# Patient Record
Sex: Female | Born: 1955
Health system: Southern US, Community
[De-identification: ages and names within clinical notes are randomized; demographics above are authoritative.]

## PROBLEM LIST (undated history)

## (undated) DIAGNOSIS — K219 Gastro-esophageal reflux disease without esophagitis: Secondary | ICD-10-CM

## (undated) DIAGNOSIS — I739 Peripheral vascular disease, unspecified: Secondary | ICD-10-CM

## (undated) DIAGNOSIS — I10 Essential (primary) hypertension: Secondary | ICD-10-CM

## (undated) HISTORY — PX: CHOLECYSTECTOMY: SHX55

## (undated) HISTORY — DX: Peripheral vascular disease, unspecified: I73.9

## (undated) NOTE — *Deleted (*Deleted)
Cardiology Consult    Patient ID: MERIEM LEMIEUX; [id number]; 1956/05/19   Admit date: 04/19/2020 Date of Consult: 04/19/2020  Primary Care Provider: Patient, No Pcp Per Primary Cardiologist: New to [facility] - Dr. Wyline Mood  Patient Profile    Sarah Bonilla is a 32 y.o. female with past medical history of HTN, tobacco use and no prior cardiac history who is being seen today for the evaluation of CHF and elevated troponin at the request of Dr. Thomes Dinning.   History of Present Illness    Sarah Bonilla presented to [facility] ED during the early morning hours of 04/19/2020 for evaluation of worsening dyspnea and a nonproductive cough over the past several days. She reports being in her usual state of health until earlier this week when she started to experience worsening dyspnea on exertion along with orthopnea and PND. Last night, she had to get up from bed and walk around to see if this would help with her breathing and it did temporarily but then she developed worsening dyspnea. She denies any associated chest pain or palpitations. She has experienced abdominal distention and lower extremity edema. Says she was diagnosed with hypertension many years ago but has not been on medical therapy for at least 10+ years. No known history of CAD or CHF. Reports her father had known hypertension and her mother had a CVA. No known family history of cardiac issues. She does smoke approximately 1 pack/day.  She was hypoxic upon arrival with oxygen saturations in the 80's on room air and this improved into the 90's with 2L [place]. Ultimately desaturated again and now on BiPAP.   BP was initially elevated to 230/107. Initial labs show WBC 9.4, Hgb 8.8 (no recent values available for comparison), platelets 146, Na+ 141, K+ 2.5 and creatinine 0.67. BNP 1081. Mg 1.7. Initial HS Troponin 58 with repeat of 75. CXR showed COPD and right basilar focal pulmonary infiltrate. EKG shows sinus tachycardia, HR 110 with LVH and  nonspecific ST abnormalities along the lateral leads which is most consistent with repol abnormalities.   She was started on IV NTG for BP control and has also received K+ supplementation. Was started on PO Lisinopril 10mg  daily.   Past Medical History:  Diagnosis Date  . Hypertension     Past Surgical History:  Procedure Laterality Date  . CHOLECYSTECTOMY       Home Medications:  Prior to Admission medications   Medication Sig Start Date End Date Taking? Authorizing Provider  acetaminophen (TYLENOL) 325 MG tablet Take 650 mg by mouth every 6 (six) hours as needed for moderate pain or headache.    [provider]  ibuprofen (ADVIL,MOTRIN) 200 MG tablet Take 200 mg by mouth every 6 (six) hours as needed for headache or moderate pain.    [provider]  lisinopril (PRINIVIL,ZESTRIL) 10 MG tablet Take 1 tablet (10 mg total) by mouth daily. 12/20/17   12/22/17, MD  potassium chloride SA (K-DUR,KLOR-CON) 20 MEQ tablet Take 1 tablet (20 mEq total) by mouth 2 (two) times daily for 5 days. 12/20/17 12/25/17  12/27/17, MD    Inpatient Medications: Scheduled Meds: . azithromycin  500 mg Oral Daily   Followed by  . [START ON 04/20/2020] azithromycin  250 mg Oral Daily  . dextromethorphan-guaiFENesin  1 tablet Oral BID  . enoxaparin (LOVENOX) injection  40 mg Subcutaneous Q24H  . feeding supplement  237 mL Oral BID BM  . ipratropium-albuterol  3 mL Nebulization Q6H  .  lisinopril  10 mg Oral Daily  . methylPREDNISolone (SOLU-MEDROL) injection  20 mg Intravenous Q12H  . pantoprazole (PROTONIX) IV  40 mg Intravenous Daily   Continuous Infusions: . nitroGLYCERIN 20 mcg/min (04/19/20 0756)   PRN Meds: iohexol, ipratropium-albuterol  Allergies:   No Known Allergies  Social History:   Social History   Socioeconomic History  . Marital status: Widowed    Spouse name: Not on file  . Number of children: Not on file  . Years of education: Not on file  .  Highest education level: Not on file  Occupational History  . Not on file  Tobacco Use  . Smoking status: Current Every Day Smoker    Packs/day: 1.00    Types: Cigarettes  . Smokeless tobacco: Never Used  Vaping Use  . Vaping Use: Never used  Substance and Sexual Activity  . Alcohol use: Yes    Comment: occasionally   . Drug use: Never  . Sexual activity: Not on file  Other Topics Concern  . Not on file  Social History Narrative  . Not on file   Social Determinants of Health   Financial Resource Strain:   . Difficulty of Paying Living Expenses: Not on file  Food Insecurity:   . Worried About Programme researcher, broadcasting/film/video in the Last Year: Not on file  . Ran Out of Food in the Last Year: Not on file  Transportation Needs:   . Lack of Transportation (Medical): Not on file  . Lack of Transportation (Non-Medical): Not on file  Physical Activity:   . Days of Exercise per Week: Not on file  . Minutes of Exercise per Session: Not on file  Stress:   . Feeling of Stress : Not on file  Social Connections:   . Frequency of Communication with Friends and Family: Not on file  . Frequency of Social Gatherings with Friends and Family: Not on file  . Attends Religious Services: Not on file  . Active Member of Clubs or Organizations: Not on file  . Attends Banker Meetings: Not on file  . Marital Status: Not on file  Intimate Partner Violence:   . Fear of Current or Ex-Partner: Not on file  . Emotionally Abused: Not on file  . Physically Abused: Not on file  . Sexually Abused: Not on file     Family History:   Family History  Problem Relation Age of Onset  . CVA Mother   . Hypertension Father       Review of Systems    General:  No chills, fever, night sweats or weight changes.  Cardiovascular:  No chest pain or palpitations. Positive for dyspnea on exertion, edema and orthopnea.  Dermatological: No rash, lesions/masses Respiratory: Positive for dry cough and dyspnea.  Urologic: No hematuria, dysuria Abdominal:   No nausea, vomiting, diarrhea, bright red blood per rectum, melena, or hematemesis Neurologic:  No visual changes, wkns, changes in mental status. All other systems reviewed and are otherwise negative except as noted above.  Physical Exam/Data    Vitals:   04/19/20 0600 04/19/20 0657 04/19/20 0700 04/19/20 0730  BP: (!) 194/108 (!) 191/119 (!) 217/129 (!) 187/114  Pulse: 99 98 (!) 108 96  Resp: (!) 24 (!) 26 (!) 27 (!) 21  Temp:      TempSrc:      SpO2: 100% 100% 100% 100%  Weight:      Height:       No intake or output data in  the 24 hours ending 04/19/20 0829 Filed Weights   04/19/20 0233  Weight: 44.5 kg   Body mass index is 17.92 kg/m.   General: Pleasant thin female. Currently on BiPAP.  Psych: Normal affect. Neuro: Alert and oriented X 3. Moves all extremities spontaneously. HEENT: Normal  Neck: Supple without bruits. JVD at 10 cm. Lungs:  Resp regular and unlabored, rales along bases and rhonchi throughout. On BiPAP. Heart: Regular rhythm, tachycardiac rate. no s3, s4, or murmurs. Abdomen: Soft, non-tender, non-distended, BS + x 4.  Extremities: No clubbing or cyanosis. 1+ pitting edema bilaterally. DP/PT/Radials 2+ and equal bilaterally.   EKG:  The EKG was personally reviewed and demonstrates: Sinus tachycardia, HR 110 with LVH and nonspecific ST abnormalities along the lateral leads which is most consistent with repol abnormalities.   Telemetry:  Telemetry was personally reviewed and demonstrates: Sinus tachycardia, HR in 100's to 110's with occasional PVC's.    Labs/Studies     Relevant CV Studies:  None on File  Laboratory Data:  Chemistry Recent Labs  Lab 04/19/20 0255 04/19/20 0447  NA 141  --   K 2.5* 2.3*  CL 103  --   CO2 27  --   GLUCOSE 109*  --   BUN 19  --   CREATININE 0.67  --   CALCIUM 8.8*  --   GFRNONAA >60  --   ANIONGAP 11  --     Recent Labs  Lab 04/19/20 0255  PROT 7.2   ALBUMIN 3.3*  AST 31  ALT 30  ALKPHOS 87  BILITOT 0.8   Hematology Recent Labs  Lab 04/19/20 0255  WBC 9.4  RBC 2.95*  HGB 8.8*  HCT 27.1*  MCV 91.9  MCH 29.8  MCHC 32.5  RDW 14.3  PLT 146*   Cardiac EnzymesNo results for input(s): TROPONINI in the last 168 hours. No results for input(s): TROPIPOC in the last 168 hours.  BNP Recent Labs  Lab 04/19/20 0255  BNP 1,081.0*    DDimer No results for input(s): DDIMER in the last 168 hours.  Radiology/Studies:  DG Chest Portable 1 View  Result Date: 04/19/2020 CLINICAL DATA:  Dyspnea, nonproductive cough EXAM: PORTABLE CHEST 1 VIEW COMPARISON:  None. FINDINGS: The lungs are symmetrically hyperinflated in keeping with changes of underlying COPD. There is more focal infiltrate noted at the right lung base, possibly infectious or inflammatory in the acute setting. No pneumothorax or pleural effusion. Cardiac size within normal limits. Pulmonary vascularity is normal. IMPRESSION: COPD. Right basilar focal pulmonary infiltrate, likely infectious in the appropriate clinical setting. Electronically Signed   By: Helyn Numbers MD   On: 04/19/2020 04:06     Assessment & Plan    1. Acute CHF Exacerbation (Echocardiogram pending to determine Systolic versus Diastolic Dysfunction) - She presented with worsening dyspnea on exertion, orthopnea, lower extremity edema and abdominal distention for the past week. BNP found to be elevated to 1081 and CXR showed COPD and possible infiltrate. - An echocardiogram is pending to assess EF and wall motion. She is at risk for a hypertensive induced cardiomyopathy given her uncontrolled blood pressure as she reports not being on medical therapy for 10+ years. - IV Lasix has not yet been administered given her significant hypokalemia (K+ still low at 2.3 by repeat labs at 0447 with additional supplementation ordered). Once K+ has improved to at least greater than 3, would recommend starting IV Lasix 40 mg  twice daily and following I's and O's along with daily  weights and continuing with K+ supplementation to keep K+ ~ 4.0. Further medication recommendations pending echocardiogram results.  2. Elevated Troponin Values - Initial HS Troponin 58 with repeat of 75. EKG shows sinus tachycardia, HR 110 with LVH and nonspecific ST abnormalities along the lateral leads which is most consistent with repol abnormalities.   - She denies any recent chest pain or history of CAD.   - Suspect her enzyme elevation is secondary to demand ischemia in the setting of CHF and hypertensive urgency. An echocardiogram is pending for further evaluation. If her EF is found to be reduced, would anticipate medical therapy for possible hypertensive cardiomyopathy with repeat imaging in several months once compliant with medical therapy.   3. Hypertensive Emergency - BP was initially elevated to 230/107. She has been started on IV nitroglycerin. Lisinopril 10 mg ordered for later this morning but she is currently NPO given the use of BiPAP. If this can be weaned, would anticipate transitioning to PO medications later today (echo would help guide medication choices). Avoid BB for now given COPD exacerbation.   4. Hypokalemia -  K+ 2.3 on most recent check with additional supplementation ordered.   5. COPD Exacerbation/Tobacco Use - CXR showed COPD and right basilar focal pulmonary infiltrate. She has been started on antibiotic therapy along with scheduled nebulizers and steroids. Will need PFT's as an outpatient. Further management per admitting team.   For questions or updates, please contact [facility] Please consult [email or web address] for contact info under Cardiology/STEMI.  Signed, Ellsworth Lennox, PA-C 04/19/2020, 8:29 AM Pager: [phone]  Attending note Patient seen and disucssed with PA Iran Ouch, I agree with her documentation. 78 yo female history of HTN and medication noncompliance, tobacco abuse, has not  seen a physician in a very long time presents with SOB and leg swelling, orthopnea.    WBC 9.4 Hgb 8.8 Plt 146 K 2.5 BUN 19 BNP 1081 Mg 1.7  COVID neg Trop 58-->75-->78 ABG 7.4/38/120/25 on 100% FiO2 CXR COPD changes, right basilar focal infiltrate EKG SR, severe LVH  SOB with ongoing workup including upcoming CT PE. CXR also with possible infiltrate though normwal WBC and febrile. Follow up CT imaging, could consider procalcitonin pending CT findings. Evidence that new onset HF is playing at least some role in her presentation given LE edema, orthopnea, BNP 1000. She has severe HTN in ER, had not seen physicians as outpatient so likely long standing severe HTN at home increasing risk for hypertensive heart disease, her severe LVH on EKG would support this  . Started on NG gtt in ER. Had been waiting on K to improve before IV lasix but since still on bipap go ahead and dose IV lasix 60mg  x 1 now. Ongoing HTN on NG drip, dose IV labetalol 10mg  x 1. Once off bipap can start oral regimen and titrate. She likely has lived at this bp for quite some time, a gradual decline would be reasonable, would avoid a rapid correction.  Anemia per primary team

---

## 2017-12-20 ENCOUNTER — Encounter (HOSPITAL_COMMUNITY): Payer: Self-pay | Admitting: Emergency Medicine

## 2017-12-20 ENCOUNTER — Emergency Department (HOSPITAL_COMMUNITY)
Admission: EM | Admit: 2017-12-20 | Discharge: 2017-12-20 | Disposition: A | Discharge status: Home / Self Care | Payer: Self-pay | Attending: Emergency Medicine | Admitting: Emergency Medicine

## 2017-12-20 ENCOUNTER — Emergency Department (HOSPITAL_COMMUNITY): Payer: Self-pay

## 2017-12-20 ENCOUNTER — Other Ambulatory Visit: Payer: Self-pay

## 2017-12-20 DIAGNOSIS — F17210 Nicotine dependence, cigarettes, uncomplicated: Secondary | ICD-10-CM | POA: Insufficient documentation

## 2017-12-20 DIAGNOSIS — R42 Dizziness and giddiness: Secondary | ICD-10-CM

## 2017-12-20 DIAGNOSIS — Z79899 Other long term (current) drug therapy: Secondary | ICD-10-CM | POA: Insufficient documentation

## 2017-12-20 DIAGNOSIS — I10 Essential (primary) hypertension: Secondary | ICD-10-CM | POA: Insufficient documentation

## 2017-12-20 DIAGNOSIS — E876 Hypokalemia: Secondary | ICD-10-CM | POA: Insufficient documentation

## 2017-12-20 DIAGNOSIS — I160 Hypertensive urgency: Secondary | ICD-10-CM | POA: Insufficient documentation

## 2017-12-20 HISTORY — DX: Essential (primary) hypertension: I10

## 2017-12-20 LAB — CBC WITH DIFFERENTIAL/PLATELET
BASOS ABS: 0 10*3/uL (ref 0.0–0.1)
Basophils Relative: 0 %
EOS ABS: 0 10*3/uL (ref 0.0–0.7)
EOS PCT: 0 %
HCT: 39 % (ref 36.0–46.0)
Hemoglobin: 13.3 g/dL (ref 12.0–15.0)
LYMPHS ABS: 3.3 10*3/uL (ref 0.7–4.0)
LYMPHS PCT: 46 %
MCH: 32.2 pg (ref 26.0–34.0)
MCHC: 34.1 g/dL (ref 30.0–36.0)
MCV: 94.4 fL (ref 78.0–100.0)
MONO ABS: 0.4 10*3/uL (ref 0.1–1.0)
Monocytes Relative: 6 %
Neutro Abs: 3.4 10*3/uL (ref 1.7–7.7)
Neutrophils Relative %: 48 %
PLATELETS: 223 10*3/uL (ref 150–400)
RBC: 4.13 MIL/uL (ref 3.87–5.11)
RDW: 12.7 % (ref 11.5–15.5)
WBC: 7.1 10*3/uL (ref 4.0–10.5)

## 2017-12-20 LAB — BASIC METABOLIC PANEL
Anion gap: 12 (ref 5–15)
BUN: 15 mg/dL (ref 8–23)
CALCIUM: 9.3 mg/dL (ref 8.9–10.3)
CO2: 28 mmol/L (ref 22–32)
Chloride: 104 mmol/L (ref 98–111)
Creatinine, Ser: 0.69 mg/dL (ref 0.44–1.00)
GFR calc Af Amer: >60 mL/min (ref >60)
GLUCOSE: 114 mg/dL — AB (ref 70–99)
Potassium: 2.6 mmol/L — CL (ref 3.5–5.1)
SODIUM: 144 mmol/L (ref 135–145)

## 2017-12-20 LAB — MAGNESIUM: Magnesium: 2.2 mg/dL (ref 1.7–2.4)

## 2017-12-20 LAB — TROPONIN I: Troponin I: <0.03 ng/mL (ref <0.03)

## 2017-12-20 MED ORDER — LISINOPRIL 10 MG PO TABS: 10.0000 mg | ORAL_TABLET | Freq: Every day | ORAL | 1 refills | Status: DC

## 2017-12-20 MED ORDER — ACETAMINOPHEN 325 MG PO TABS
650.0000 mg | ORAL_TABLET | Freq: Once | ORAL | Status: AC
  Administered 2017-12-20: 650 mg via ORAL
  Filled 2017-12-20: qty 2

## 2017-12-20 MED ORDER — POTASSIUM CHLORIDE 10 MEQ/100ML IV SOLN
10.0000 meq | INTRAVENOUS | Status: AC
  Administered 2017-12-20 (×3): 10 meq via INTRAVENOUS
  Filled 2017-12-20 (×3): qty 100

## 2017-12-20 MED ORDER — POTASSIUM CHLORIDE CRYS ER 20 MEQ PO TBCR: 20.0000 meq | EXTENDED_RELEASE_TABLET | Freq: Two times a day (BID) | ORAL | 0 refills | Status: DC

## 2017-12-20 MED ORDER — POTASSIUM CHLORIDE CRYS ER 20 MEQ PO TBCR
40.0000 meq | EXTENDED_RELEASE_TABLET | Freq: Once | ORAL | Status: AC
  Administered 2017-12-20: 40 meq via ORAL
  Filled 2017-12-20: qty 2

## 2017-12-20 MED ORDER — LACTATED RINGERS IV BOLUS
1000.0000 mL | Freq: Once | INTRAVENOUS | Status: AC
  Administered 2017-12-20: 1000 mL via INTRAVENOUS

## 2017-12-20 NOTE — ED Notes (Signed)
BP 235/121.  Pt not on BP medication.

## 2017-12-20 NOTE — ED Notes (Signed)
Date and time results received: 12/20/17 9:41 AM  (use smartphrase ".now" to insert current time)  Test: K+ Critical Value: 2.6  Name of Provider Notified: Criss AlvineGoldston  Orders Received? Or Actions Taken?: Orders Received - See Orders for details

## 2017-12-20 NOTE — ED Provider Notes (Signed)
[facility] EMERGENCY DEPARTMENT Provider Note   CSN: [id number] Arrival date & time: 12/20/17  0901     History   Chief Complaint Chief Complaint  Patient presents with  . Dizziness    HPI Sarah Bonilla is a 62 y.o. female.  HPI  62 year old female presents with a chief complaint of dizziness.  Patient states that started last night sometime around 9 PM.  She got a warm feeling and noticed a slight, 2/10 headache.  She felt dizzy which she describes as off balance but not a room spinning sensation or a near syncope feeling.  She vomited once.  Overall the whole episode seemed to last around 5 minutes.  Went to bed but when she woke up she is been feeling the hot feeling again.  She states there is a feeling in her head or discomfort but it is not really a pain in her forehead.  The dizziness is not really present right now.  No weakness or numbness or blurry vision.  No chest pain or shortness of breath.  She denies any significant past medical history.  Past Medical History:  Diagnosis Date  . Hypertension     There are no active problems to display for this patient.   Past Surgical History:  Procedure Laterality Date  . CHOLECYSTECTOMY       OB History   None      Home Medications    Prior to Admission medications   Medication Sig Start Date End Date Taking? Authorizing Provider  acetaminophen (TYLENOL) 325 MG tablet Take 650 mg by mouth every 6 (six) hours as needed for moderate pain or headache.   Yes [provider]  ibuprofen (ADVIL,MOTRIN) 200 MG tablet Take 200 mg by mouth every 6 (six) hours as needed for headache or moderate pain.   Yes [provider]  lisinopril (PRINIVIL,ZESTRIL) 10 MG tablet Take 1 tablet (10 mg total) by mouth daily. 12/20/17   Pricilla LovelessGoldston, Euel Castile, MD  potassium chloride SA (K-DUR,KLOR-CON) 20 MEQ tablet Take 1 tablet (20 mEq total) by mouth 2 (two) times daily for 5 days. 12/20/17 12/25/17  Pricilla LovelessGoldston, Torien Ramroop, MD     Family History No family history on file.  Social History Social History   Tobacco Use  . Smoking status: Current Every Day Smoker    Packs/day: 1.00    Types: Cigarettes  . Smokeless tobacco: Never Used  Substance Use Topics  . Alcohol use: Yes    Comment: occasionally   . Drug use: Never     Allergies   Patient has no known allergies.   Review of Systems Review of Systems  Eyes: Negative for visual disturbance.  Respiratory: Negative for shortness of breath.   Cardiovascular: Negative for chest pain.  Gastrointestinal: Positive for vomiting. Negative for abdominal pain.  Neurological: Positive for dizziness and headaches. Negative for weakness and numbness.  All other systems reviewed and are negative.    Physical Exam Updated Vital Signs BP (!) 174/77 (BP Location: Right Arm)   Pulse 60   Temp 98.2 F (36.8 C) (Oral)   Resp 16   Ht 5\' 2"  (1.575 m)   Wt 48.5 kg (107 lb)   SpO2 99%   BMI 19.57 kg/m   Physical Exam  Constitutional: She is oriented to person, place, and time. She appears well-developed and well-nourished.  HENT:  Head: Normocephalic and atraumatic.  Right Ear: External ear normal.  Left Ear: External ear normal.  Nose: Nose normal.  Eyes: Pupils  are equal, round, and reactive to light. EOM are normal. Right eye exhibits no discharge. Left eye exhibits no discharge.  Neck: Neck supple.  Cardiovascular: Normal rate, regular rhythm and normal heart sounds.  Pulmonary/Chest: Effort normal and breath sounds normal.  Abdominal: Soft. There is no tenderness.  Neurological: She is alert and oriented to person, place, and time.  CN 3-12 grossly intact. 5/5 strength in all 4 extremities. Grossly normal sensation. Normal finger to nose.   Skin: Skin is warm and dry.  Nursing note and vitals reviewed.    ED Treatments / Results  Labs (all labs ordered are listed, but only abnormal results are displayed) Labs Reviewed  BASIC METABOLIC  PANEL - Abnormal; Notable for the following components:      Result Value   Potassium 2.6 (*)    Glucose, Bld 114 (*)    All other components within normal limits  CBC WITH DIFFERENTIAL/PLATELET  MAGNESIUM  TROPONIN I    EKG EKG Interpretation  Date/Time:  Monday December 20 2017 09:18:47 EDT Ventricular Rate:  80 PR Interval:    QRS Duration: 74 QT Interval:  367 QTC Calculation: 424 R Axis:   55 Text Interpretation:  Sinus rhythm Borderline short PR interval LVH with secondary repolarization abnormality No old tracing to compare Confirmed by Pricilla Loveless [phone]) on 12/20/2017 9:39:27 AM   Radiology Ct Head Wo Contrast  Result Date: 12/20/2017 CLINICAL DATA:  Dizziness and stroke-like symptoms EXAM: CT HEAD WITHOUT CONTRAST TECHNIQUE: Contiguous axial images were obtained from the base of the skull through the vertex without intravenous contrast. COMPARISON:  None. FINDINGS: Brain: No evidence of acute infarction, hemorrhage, hydrocephalus, extra-axial collection or mass lesion/mass effect. Vascular: No hyperdense vessel or unexpected calcification. Skull: Normal. Negative for fracture or focal lesion. Sinuses/Orbits: No acute finding. Other: None. IMPRESSION: Normal head CT Electronically Signed   By: Alcide Clever M.D.   On: 12/20/2017 11:20    Procedures Procedures (including critical care time)  Medications Ordered in ED Medications  acetaminophen (TYLENOL) tablet 650 mg (650 mg Oral Given 12/20/17 1028)  lactated ringers bolus 1,000 mL (0 mLs Intravenous Stopped 12/20/17 1236)  potassium chloride SA (K-DUR,KLOR-CON) CR tablet 40 mEq (40 mEq Oral Given 12/20/17 1028)  potassium chloride 10 mEq in 100 mL IVPB (0 mEq Intravenous Stopped 12/20/17 1350)     Initial Impression / Assessment and Plan / ED Course  I have reviewed the triage vital signs and the nursing notes.  Pertinent labs & imaging results that were available during my care of the patient were reviewed by me and  considered in my medical decision making (see chart for details).     Patient's dizziness is nonspecific.  Could be related to her hypertension but I suspect this is probably subacute or chronic.  She is noted to have significant hypokalemia and this is been repleted IV and orally.  Magnesium normal.  She did endorse an episode of vomiting and feeling off balance last night.  Given her nonspecific headache, CT obtained but is benign.  I discussed MRI for possible subacute stroke or TIA but she declines.  I think it is probably less likely she had a stroke given no nystagmus or current off-balance sensation.  I did discuss the importance of following up with a PCP and treating blood pressure.  No chest pain.  Return precautions.  Final Clinical Impressions(s) / ED Diagnoses   Final diagnoses:  Dizziness  Hypertensive urgency  Hypokalemia    ED Discharge Orders  Ordered    potassium chloride SA (K-DUR,KLOR-CON) 20 MEQ tablet  2 times daily,   Status:  Discontinued     12/20/17 1205    lisinopril (PRINIVIL,ZESTRIL) 10 MG tablet  Daily,   Status:  Discontinued     12/20/17 1205    lisinopril (PRINIVIL,ZESTRIL) 10 MG tablet  Daily     12/20/17 1205    potassium chloride SA (K-DUR,KLOR-CON) 20 MEQ tablet  2 times daily     12/20/17 1205       Pricilla Loveless, MD 12/20/17 1604

## 2017-12-20 NOTE — ED Triage Notes (Signed)
Pt c/o dizziness, "feeling hot," and nausea/vomiting that began yesterday. Pt states she feels it is related to the heat. Pt does not work outside.

## 2017-12-20 NOTE — Discharge Instructions (Signed)
If your dizziness recurs or worsens or he develop a severe headache, vomiting, weakness or numbness in your arms or legs, blurry vision, or any other new/concerning symptoms and return to the ER for evaluation.  Otherwise follow-up with your primary care physician.

## 2019-08-26 IMAGING — CT CT HEAD W/O CM
3 series · 16 of 47 positions shown, 19 images · non-contrast
Comparison: None.

CLINICAL DATA: Dizziness and stroke-like symptoms

EXAM:
CT HEAD WITHOUT CONTRAST
TECHNIQUE: Contiguous axial images were obtained from the base of the skull
through the vertex without intravenous contrast.

[Series 2: head wo · axial · 0.40mm/px · z∈[-150,-20]mm · 10 of 32 slices shown, 13 images]
[im 3/32  brain]
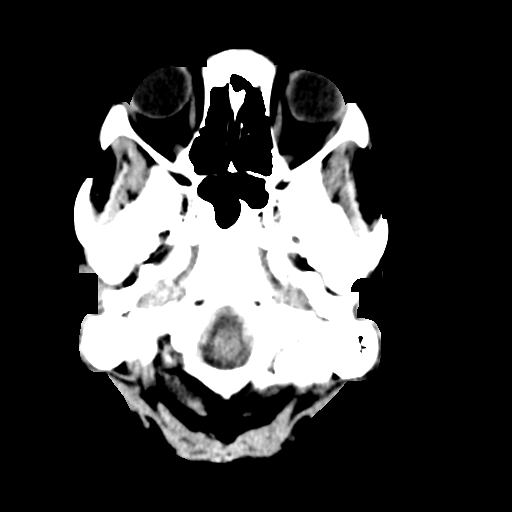
[im 3/32  bone]
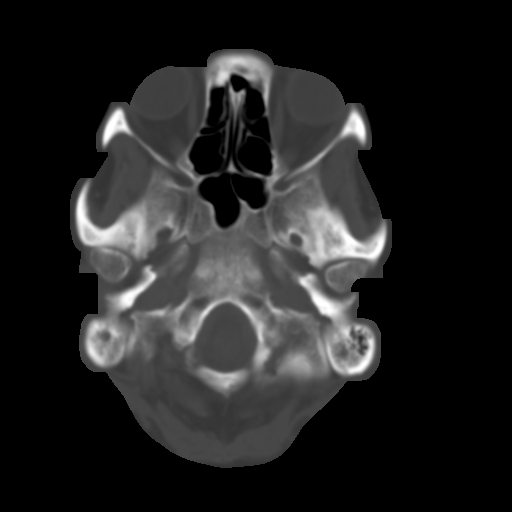
[im 6/32  brain]
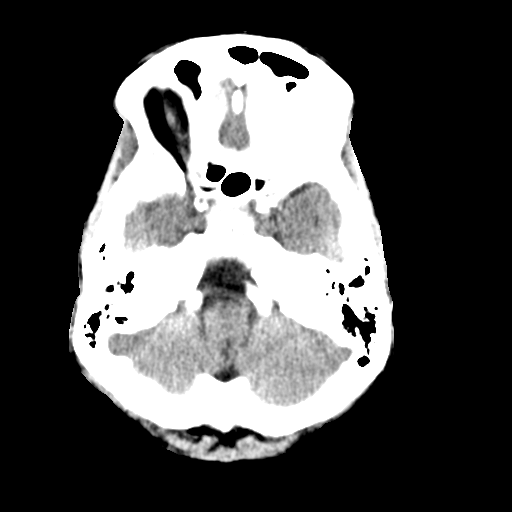
[im 9/32  brain]
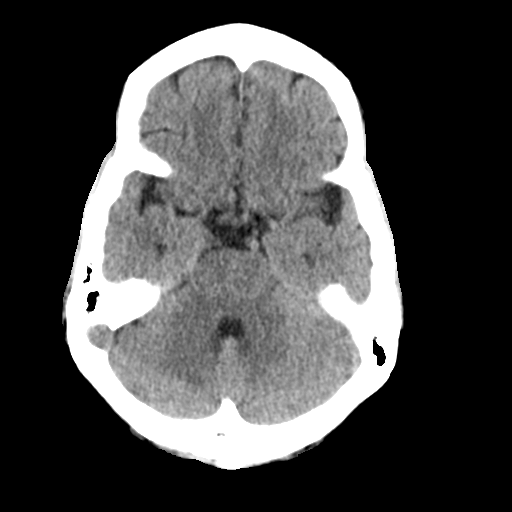
[im 11/32  brain]
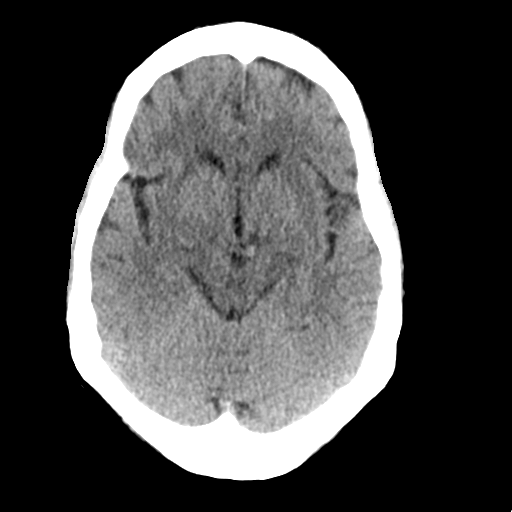
[im 14/32  brain]
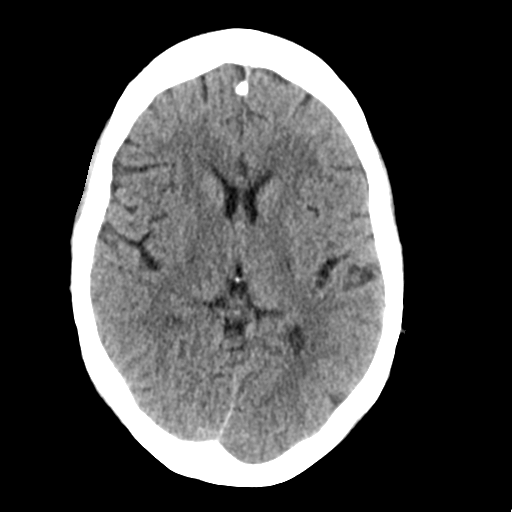
[im 14/32  bone]
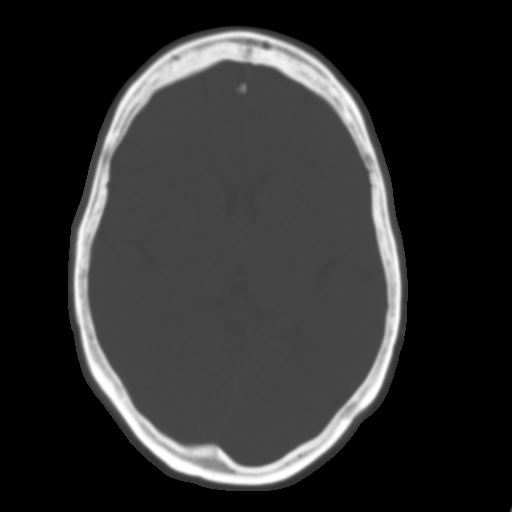
[im 18/32  brain]
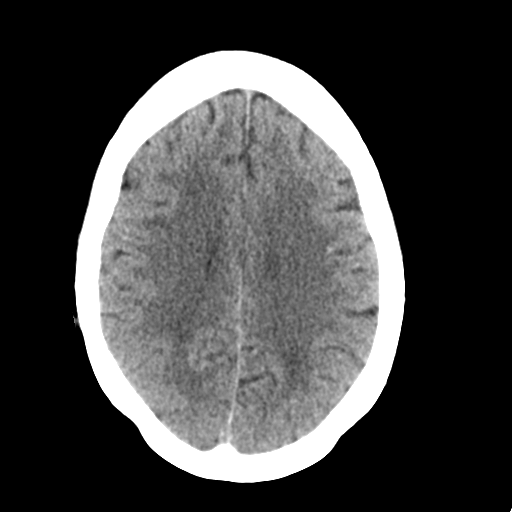
[im 21/32  brain]
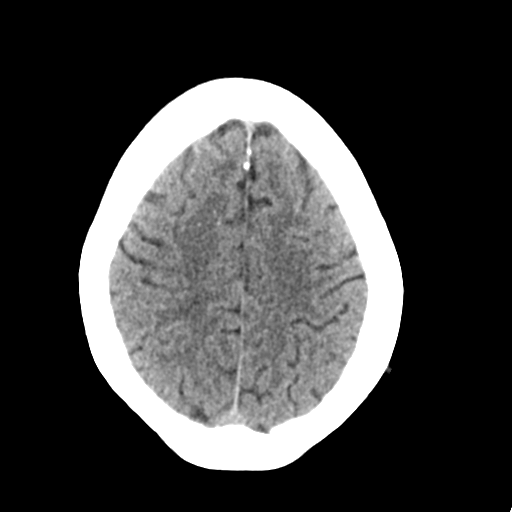
[im 24/32  brain]
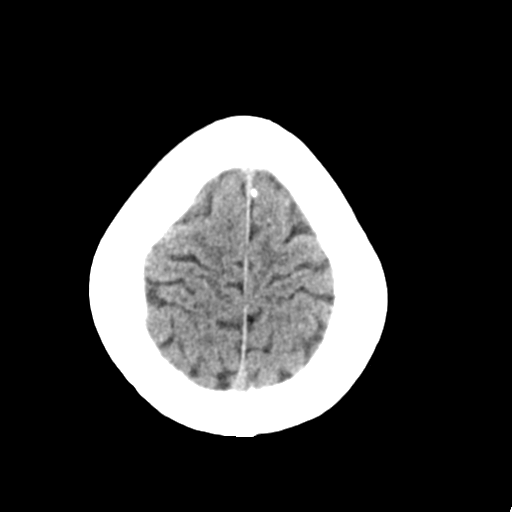
[im 26/32  brain]
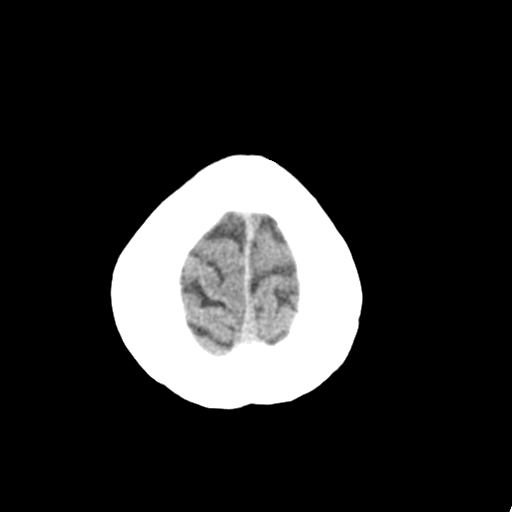
[im 26/32  bone]
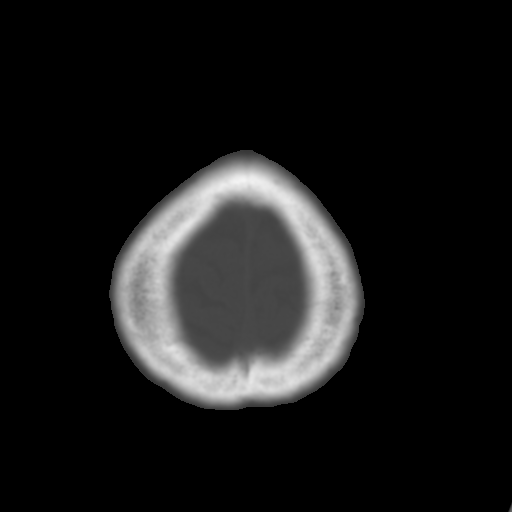
[im 29/32  brain]
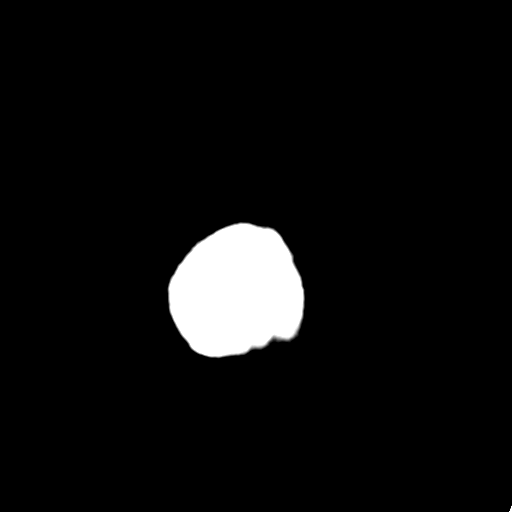

[Series 4: coronal soft tissue · coronal · 0.29mm/px · 3 of 64 slices shown]
[im 22/64  brain]
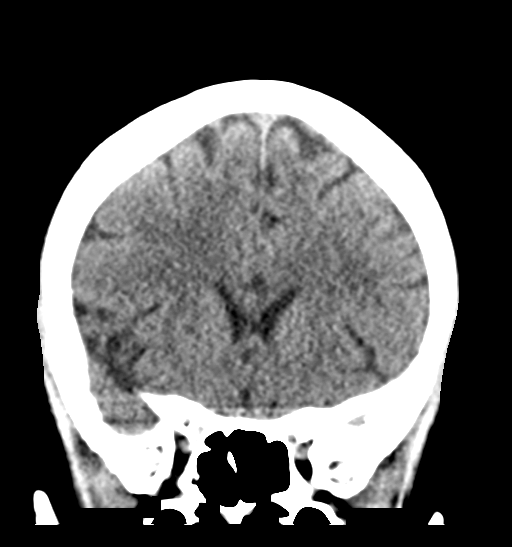
[im 29/64  brain]
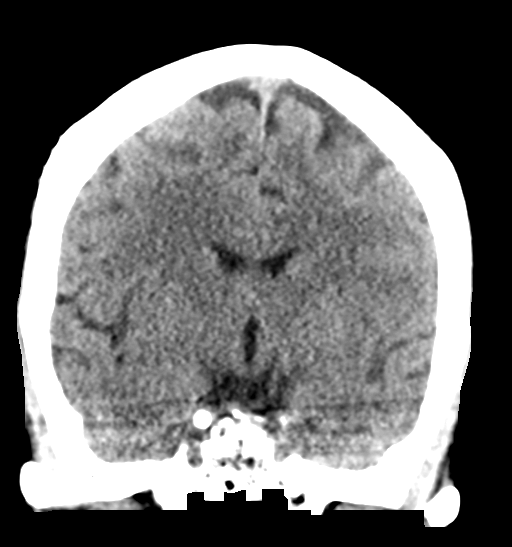
[im 36/64  brain]
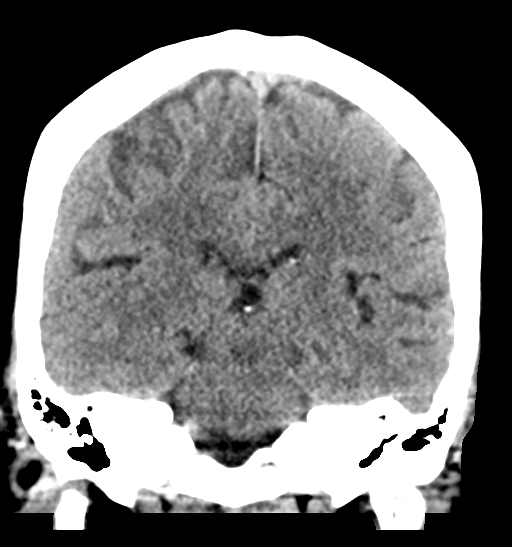

[Series 5: sagittal soft tissue · sagittal · 0.30mm/px · 3 of 50 slices shown]
[im 17/50  brain]
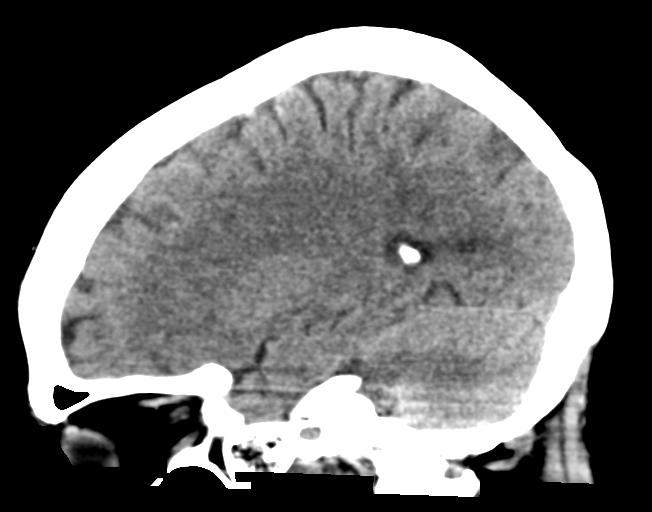
[im 25/50  brain]
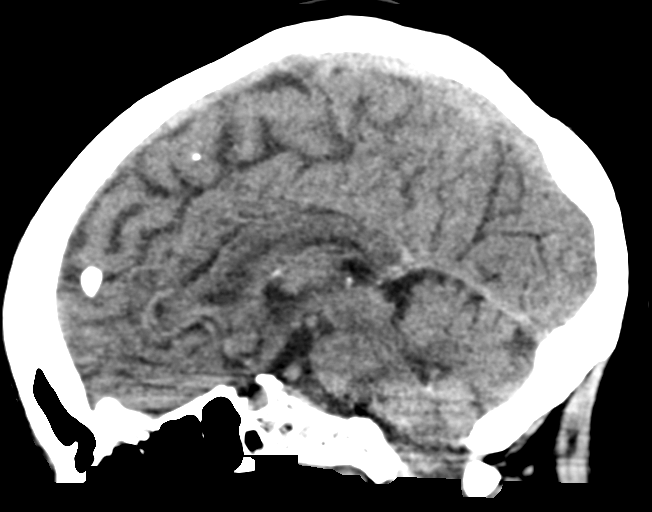
[im 33/50  brain]
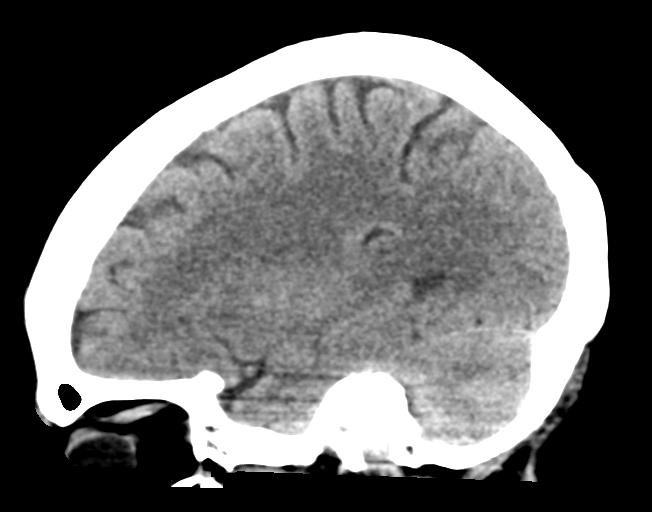

[16 of 47 positions shown; findings below may reference images not displayed]

FINDINGS: Brain: No evidence of acute infarction, hemorrhage, hydrocephalus,
extra-axial collection or mass lesion/mass effect.

Vascular: No hyperdense vessel or unexpected calcification.

Skull: Normal. Negative for fracture or focal lesion.

Sinuses/Orbits: No acute finding.

Other: None.
IMPRESSION: Normal head CT

## 2020-04-19 ENCOUNTER — Inpatient Hospital Stay (HOSPITAL_COMMUNITY): Payer: Self-pay

## 2020-04-19 ENCOUNTER — Emergency Department (HOSPITAL_COMMUNITY): Payer: Self-pay

## 2020-04-19 ENCOUNTER — Inpatient Hospital Stay (HOSPITAL_COMMUNITY)
Admission: EM | Admit: 2020-04-19 | Discharge: 2020-04-24 | DRG: 291 | Disposition: A | Discharge status: Home / Self Care | Payer: Self-pay | Attending: Internal Medicine | Admitting: Internal Medicine

## 2020-04-19 ENCOUNTER — Encounter (HOSPITAL_COMMUNITY): Payer: Self-pay

## 2020-04-19 ENCOUNTER — Other Ambulatory Visit: Payer: Self-pay

## 2020-04-19 DIAGNOSIS — Z20822 Contact with and (suspected) exposure to covid-19: Secondary | ICD-10-CM | POA: Diagnosis present

## 2020-04-19 DIAGNOSIS — D696 Thrombocytopenia, unspecified: Secondary | ICD-10-CM

## 2020-04-19 DIAGNOSIS — Z720 Tobacco use: Secondary | ICD-10-CM

## 2020-04-19 DIAGNOSIS — M7989 Other specified soft tissue disorders: Secondary | ICD-10-CM

## 2020-04-19 DIAGNOSIS — I5033 Acute on chronic diastolic (congestive) heart failure: Secondary | ICD-10-CM | POA: Diagnosis present

## 2020-04-19 DIAGNOSIS — Z79899 Other long term (current) drug therapy: Secondary | ICD-10-CM

## 2020-04-19 DIAGNOSIS — F17210 Nicotine dependence, cigarettes, uncomplicated: Secondary | ICD-10-CM | POA: Diagnosis present

## 2020-04-19 DIAGNOSIS — I214 Non-ST elevation (NSTEMI) myocardial infarction: Secondary | ICD-10-CM

## 2020-04-19 DIAGNOSIS — J449 Chronic obstructive pulmonary disease, unspecified: Secondary | ICD-10-CM

## 2020-04-19 DIAGNOSIS — Z8249 Family history of ischemic heart disease and other diseases of the circulatory system: Secondary | ICD-10-CM

## 2020-04-19 DIAGNOSIS — R000 Tachycardia, unspecified: Secondary | ICD-10-CM | POA: Diagnosis present

## 2020-04-19 DIAGNOSIS — R2242 Localized swelling, mass and lump, left lower limb: Secondary | ICD-10-CM

## 2020-04-19 DIAGNOSIS — Z91148 Patient's other noncompliance with medication regimen for other reason: Secondary | ICD-10-CM

## 2020-04-19 DIAGNOSIS — J810 Acute pulmonary edema: Secondary | ICD-10-CM

## 2020-04-19 DIAGNOSIS — R7989 Other specified abnormal findings of blood chemistry: Secondary | ICD-10-CM

## 2020-04-19 DIAGNOSIS — J441 Chronic obstructive pulmonary disease with (acute) exacerbation: Secondary | ICD-10-CM | POA: Diagnosis present

## 2020-04-19 DIAGNOSIS — R0902 Hypoxemia: Secondary | ICD-10-CM

## 2020-04-19 DIAGNOSIS — I161 Hypertensive emergency: Secondary | ICD-10-CM

## 2020-04-19 DIAGNOSIS — Z9114 Patient's other noncompliance with medication regimen: Secondary | ICD-10-CM

## 2020-04-19 DIAGNOSIS — E8809 Other disorders of plasma-protein metabolism, not elsewhere classified: Secondary | ICD-10-CM

## 2020-04-19 DIAGNOSIS — I110 Hypertensive heart disease with heart failure: Principal | ICD-10-CM | POA: Diagnosis present

## 2020-04-19 DIAGNOSIS — D649 Anemia, unspecified: Secondary | ICD-10-CM | POA: Diagnosis present

## 2020-04-19 DIAGNOSIS — I509 Heart failure, unspecified: Secondary | ICD-10-CM

## 2020-04-19 DIAGNOSIS — R778 Other specified abnormalities of plasma proteins: Secondary | ICD-10-CM

## 2020-04-19 DIAGNOSIS — I10 Essential (primary) hypertension: Secondary | ICD-10-CM

## 2020-04-19 DIAGNOSIS — I5030 Unspecified diastolic (congestive) heart failure: Secondary | ICD-10-CM

## 2020-04-19 DIAGNOSIS — I82502 Chronic embolism and thrombosis of unspecified deep veins of left lower extremity: Secondary | ICD-10-CM | POA: Diagnosis present

## 2020-04-19 DIAGNOSIS — J9601 Acute respiratory failure with hypoxia: Secondary | ICD-10-CM | POA: Diagnosis present

## 2020-04-19 DIAGNOSIS — E876 Hypokalemia: Secondary | ICD-10-CM

## 2020-04-19 DIAGNOSIS — Z9049 Acquired absence of other specified parts of digestive tract: Secondary | ICD-10-CM

## 2020-04-19 LAB — BLOOD GAS, ARTERIAL
Acid-Base Excess: 1 mmol/L (ref 0.0–2.0)
Bicarbonate: 25.4 mmol/L (ref 20.0–28.0)
FIO2: 100
O2 Saturation: 98.2 %
Patient temperature: 36.8
pCO2 arterial: 38.5 mmHg (ref 32.0–48.0)
pH, Arterial: 7.427 (ref 7.350–7.450)
pO2, Arterial: 120 mmHg — ABNORMAL HIGH (ref 83.0–108.0)

## 2020-04-19 LAB — ECHOCARDIOGRAM COMPLETE
AR max vel: 1.79 cm2
AV Area VTI: 1.97 cm2
AV Area mean vel: 1.75 cm2
AV Mean grad: 2.4 mmHg
AV Peak grad: 4.8 mmHg
Ao pk vel: 1.1 m/s
Area-P 1/2: 6.43 cm2
Height: 62 in
S' Lateral: 2.5 cm
Weight: 1568 oz

## 2020-04-19 LAB — CBC WITH DIFFERENTIAL/PLATELET
Abs Immature Granulocytes: 0.02 10*3/uL (ref 0.00–0.07)
Basophils Absolute: 0 10*3/uL (ref 0.0–0.1)
Basophils Relative: 0 %
Eosinophils Absolute: 0 10*3/uL (ref 0.0–0.5)
Eosinophils Relative: 0 %
HCT: 27.1 % — ABNORMAL LOW (ref 36.0–46.0)
Hemoglobin: 8.8 g/dL — ABNORMAL LOW (ref 12.0–15.0)
Immature Granulocytes: 0 %
Lymphocytes Relative: 19 %
Lymphs Abs: 1.8 10*3/uL (ref 0.7–4.0)
MCH: 29.8 pg (ref 26.0–34.0)
MCHC: 32.5 g/dL (ref 30.0–36.0)
MCV: 91.9 fL (ref 80.0–100.0)
Monocytes Absolute: 0.6 10*3/uL (ref 0.1–1.0)
Monocytes Relative: 6 %
Neutro Abs: 7 10*3/uL (ref 1.7–7.7)
Neutrophils Relative %: 75 %
Platelets: 146 10*3/uL — ABNORMAL LOW (ref 150–400)
RBC: 2.95 MIL/uL — ABNORMAL LOW (ref 3.87–5.11)
RDW: 14.3 % (ref 11.5–15.5)
WBC: 9.4 10*3/uL (ref 4.0–10.5)
nRBC: 0 % (ref 0.0–0.2)

## 2020-04-19 LAB — TYPE AND SCREEN
ABO/RH(D): B POS
Antibody Screen: NEGATIVE

## 2020-04-19 LAB — ABO/RH: ABO/RH(D): B POS

## 2020-04-19 LAB — COMPREHENSIVE METABOLIC PANEL
ALT: 30 U/L (ref 0–44)
AST: 31 U/L (ref 15–41)
Albumin: 3.3 g/dL — ABNORMAL LOW (ref 3.5–5.0)
Alkaline Phosphatase: 87 U/L (ref 38–126)
Anion gap: 11 (ref 5–15)
BUN: 19 mg/dL (ref 8–23)
CO2: 27 mmol/L (ref 22–32)
Calcium: 8.8 mg/dL — ABNORMAL LOW (ref 8.9–10.3)
Chloride: 103 mmol/L (ref 98–111)
Creatinine, Ser: 0.67 mg/dL (ref 0.44–1.00)
GFR, Estimated: >60 mL/min (ref >60)
Glucose, Bld: 109 mg/dL — ABNORMAL HIGH (ref 70–99)
Potassium: 2.5 mmol/L — CL (ref 3.5–5.1)
Sodium: 141 mmol/L (ref 135–145)
Total Bilirubin: 0.8 mg/dL (ref 0.3–1.2)
Total Protein: 7.2 g/dL (ref 6.5–8.1)

## 2020-04-19 LAB — TROPONIN I (HIGH SENSITIVITY)
Troponin I (High Sensitivity): 58 ng/L — ABNORMAL HIGH (ref <18)
Troponin I (High Sensitivity): 75 ng/L — ABNORMAL HIGH (ref <18)
Troponin I (High Sensitivity): 78 ng/L — ABNORMAL HIGH (ref <18)

## 2020-04-19 LAB — MAGNESIUM: Magnesium: 1.7 mg/dL (ref 1.7–2.4)

## 2020-04-19 LAB — POTASSIUM
Potassium: 2.3 mmol/L — CL (ref 3.5–5.1)
Potassium: 3.4 mmol/L — ABNORMAL LOW (ref 3.5–5.1)

## 2020-04-19 LAB — BRAIN NATRIURETIC PEPTIDE: B Natriuretic Peptide: 1081 pg/mL — ABNORMAL HIGH (ref 0.0–100.0)

## 2020-04-19 LAB — RESP PANEL BY RT-PCR (FLU A&B, COVID) ARPGX2
Influenza A by PCR: NEGATIVE
Influenza B by PCR: NEGATIVE
SARS Coronavirus 2 by RT PCR: NEGATIVE

## 2020-04-19 LAB — HIV ANTIBODY (ROUTINE TESTING W REFLEX): HIV Screen 4th Generation wRfx: NONREACTIVE

## 2020-04-19 MED ORDER — ENOXAPARIN SODIUM 40 MG/0.4ML ~~LOC~~ SOLN
40.0000 mg | SUBCUTANEOUS | Status: DC
  Administered 2020-04-19 – 2020-04-20 (×2): 40 mg via SUBCUTANEOUS
  Filled 2020-04-19 (×3): qty 0.4

## 2020-04-19 MED ORDER — FUROSEMIDE 10 MG/ML IJ SOLN
60.0000 mg | Freq: Once | INTRAMUSCULAR | Status: AC
  Administered 2020-04-19: 60 mg via INTRAVENOUS
  Filled 2020-04-19: qty 6

## 2020-04-19 MED ORDER — LISINOPRIL 10 MG PO TABS
10.0000 mg | ORAL_TABLET | Freq: Every day | ORAL | Status: DC
  Administered 2020-04-19 – 2020-04-20 (×2): 10 mg via ORAL
  Filled 2020-04-19 (×2): qty 1

## 2020-04-19 MED ORDER — POTASSIUM CHLORIDE 10 MEQ/100ML IV SOLN
INTRAVENOUS | Status: AC
  Filled 2020-04-19: qty 100

## 2020-04-19 MED ORDER — IPRATROPIUM-ALBUTEROL 0.5-2.5 (3) MG/3ML IN SOLN
3.0000 mL | Freq: Four times a day (QID) | RESPIRATORY_TRACT | Status: DC
  Administered 2020-04-19 (×3): 3 mL via RESPIRATORY_TRACT
  Filled 2020-04-19 (×3): qty 3

## 2020-04-19 MED ORDER — FUROSEMIDE 10 MG/ML IJ SOLN
40.0000 mg | Freq: Two times a day (BID) | INTRAMUSCULAR | Status: DC
  Administered 2020-04-20: 40 mg via INTRAVENOUS
  Filled 2020-04-19: qty 4

## 2020-04-19 MED ORDER — IPRATROPIUM-ALBUTEROL 0.5-2.5 (3) MG/3ML IN SOLN
3.0000 mL | RESPIRATORY_TRACT | Status: DC | PRN
  Filled 2020-04-19: qty 3

## 2020-04-19 MED ORDER — CHLORHEXIDINE GLUCONATE CLOTH 2 % EX PADS
6.0000 | MEDICATED_PAD | Freq: Every day | CUTANEOUS | Status: DC
  Administered 2020-04-19 – 2020-04-24 (×6): 6 via TOPICAL

## 2020-04-19 MED ORDER — AZITHROMYCIN 250 MG PO TABS
500.0000 mg | ORAL_TABLET | Freq: Every day | ORAL | Status: AC
  Administered 2020-04-19: 500 mg via ORAL
  Filled 2020-04-19: qty 2

## 2020-04-19 MED ORDER — METHYLPREDNISOLONE SODIUM SUCC 40 MG IJ SOLR
20.0000 mg | Freq: Two times a day (BID) | INTRAMUSCULAR | Status: DC
  Administered 2020-04-19 – 2020-04-20 (×3): 20 mg via INTRAVENOUS
  Filled 2020-04-19 (×3): qty 1

## 2020-04-19 MED ORDER — IOHEXOL 350 MG/ML SOLN
75.0000 mL | Freq: Once | INTRAVENOUS | Status: AC | PRN
  Administered 2020-04-19: 75 mL via INTRAVENOUS

## 2020-04-19 MED ORDER — POTASSIUM CHLORIDE 10 MEQ/100ML IV SOLN
10.0000 meq | INTRAVENOUS | Status: AC
  Administered 2020-04-19 (×3): 10 meq via INTRAVENOUS
  Filled 2020-04-19 (×2): qty 100

## 2020-04-19 MED ORDER — POTASSIUM CHLORIDE CRYS ER 20 MEQ PO TBCR
80.0000 meq | EXTENDED_RELEASE_TABLET | Freq: Once | ORAL | Status: AC
  Administered 2020-04-19: 80 meq via ORAL
  Filled 2020-04-19: qty 4

## 2020-04-19 MED ORDER — IOHEXOL 300 MG/ML  SOLN: 75.0000 mL | Freq: Once | INTRAMUSCULAR | Status: DC | PRN

## 2020-04-19 MED ORDER — PANTOPRAZOLE SODIUM 40 MG IV SOLR
40.0000 mg | Freq: Every day | INTRAVENOUS | Status: DC
  Administered 2020-04-19 – 2020-04-23 (×5): 40 mg via INTRAVENOUS
  Filled 2020-04-19 (×5): qty 40

## 2020-04-19 MED ORDER — FUROSEMIDE 10 MG/ML IJ SOLN
80.0000 mg | Freq: Once | INTRAMUSCULAR | Status: DC
  Filled 2020-04-19: qty 8

## 2020-04-19 MED ORDER — AZITHROMYCIN 250 MG PO TABS
250.0000 mg | ORAL_TABLET | Freq: Every day | ORAL | Status: DC
  Administered 2020-04-20: 250 mg via ORAL
  Filled 2020-04-19: qty 1

## 2020-04-19 MED ORDER — LABETALOL HCL 5 MG/ML IV SOLN
10.0000 mg | Freq: Once | INTRAVENOUS | Status: AC
  Administered 2020-04-19: 10 mg via INTRAVENOUS
  Filled 2020-04-19: qty 4

## 2020-04-19 MED ORDER — NITROGLYCERIN IN D5W 200-5 MCG/ML-% IV SOLN
5.0000 ug/min | INTRAVENOUS | Status: DC
  Administered 2020-04-19: 80 ug/min via INTRAVENOUS
  Administered 2020-04-19: 5 ug/min via INTRAVENOUS
  Administered 2020-04-19: 95 ug/min via INTRAVENOUS
  Administered 2020-04-19: 75 ug/min via INTRAVENOUS
  Administered 2020-04-19: 105 ug/min via INTRAVENOUS
  Administered 2020-04-19: 85 ug/min via INTRAVENOUS
  Administered 2020-04-19: 90 ug/min via INTRAVENOUS
  Administered 2020-04-19: 10 ug/min via INTRAVENOUS
  Administered 2020-04-19: 100 ug/min via INTRAVENOUS
  Administered 2020-04-19: 70 ug/min via INTRAVENOUS
  Administered 2020-04-20: 30 ug/min via INTRAVENOUS
  Administered 2020-04-20: 120 ug/min via INTRAVENOUS
  Administered 2020-04-21: 35 ug/min via INTRAVENOUS
  Administered 2020-04-21: 60 ug/min via INTRAVENOUS
  Filled 2020-04-19 (×6): qty 250

## 2020-04-19 MED ORDER — POTASSIUM CHLORIDE 10 MEQ/100ML IV SOLN
10.0000 meq | INTRAVENOUS | Status: AC
  Administered 2020-04-19 (×3): 10 meq via INTRAVENOUS
  Filled 2020-04-19 (×3): qty 100

## 2020-04-19 MED ORDER — MAGNESIUM SULFATE 2 GM/50ML IV SOLN
2.0000 g | Freq: Once | INTRAVENOUS | Status: AC
  Administered 2020-04-19: 2 g via INTRAVENOUS
  Filled 2020-04-19: qty 50

## 2020-04-19 MED ORDER — ENSURE ENLIVE PO LIQD
237.0000 mL | Freq: Two times a day (BID) | ORAL | Status: DC
  Administered 2020-04-20: 1 via ORAL
  Administered 2020-04-20 – 2020-04-24 (×7): 237 mL via ORAL

## 2020-04-19 MED ORDER — HYDRALAZINE HCL 25 MG PO TABS
25.0000 mg | ORAL_TABLET | Freq: Once | ORAL | Status: AC
  Administered 2020-04-19: 25 mg via ORAL
  Filled 2020-04-19: qty 1

## 2020-04-19 MED ORDER — ACETAMINOPHEN 325 MG PO TABS
650.0000 mg | ORAL_TABLET | Freq: Four times a day (QID) | ORAL | Status: DC | PRN
  Administered 2020-04-19 – 2020-04-24 (×10): 650 mg via ORAL
  Filled 2020-04-19 (×10): qty 2

## 2020-04-19 MED ORDER — DM-GUAIFENESIN ER 30-600 MG PO TB12
1.0000 | ORAL_TABLET | Freq: Two times a day (BID) | ORAL | Status: DC
  Administered 2020-04-19 – 2020-04-24 (×11): 1 via ORAL
  Filled 2020-04-19 (×11): qty 1

## 2020-04-19 NOTE — H&P (Signed)
History and Physical  Sarah Bonilla GXQ:[id number] DOB: 27-Nov-1955 DOA: 04/19/2020  Referring physician: Marily Memos, MD PCP: Patient, No Pcp Per  Patient coming from: Home  Chief Complaint: Shortness of breath  HPI: Sarah Bonilla is a 64 y.o. female with medical history significant for hypertension (noncompliant with medication) and tobacco abuse who presents to the emergency department due to 3-4 day onset of shortness of breath and leg swelling (L > R).  Shortness of breath worsens with lying flat in bed and improves on sitting, is associated with dry cough.  Patient does not have a PCP and has not been to any doctor for medical checkup in several years.  She endorsed 45-pack-year smoking history.  Patient denies chest pain, fever, chills, nausea, vomiting or abdominal pain.  ED Course:  In the emergency department, rated at 230/107, she was tachycardic and hypoxic on room air with an O2 sat of 89%.  This improved to 92-95% on supplemental oxygen via [place] at 2 LPM.  Work-up in the ED showed normal CBC and BMP except for mild thrombocytopenia and hypokalemia.  Albumin 3.3, BNP 1081.0, troponin x2-58 > 75.  Respiratory panel for influenza A, B and SARS coronavirus 2 was negative.  Chest x-ray showed COPD, right basilar focal pulmonary infiltrate, likely infectious in the appropriate clinical setting. She  was treated with IV hydralazine, magnesium was given, potassium was replenished.  While patient was still in the ED, she started to complain of worsening shortness of breath and she was seen tripoding on the side of the bed with O2 sats of 75% on 2 L via [place].  Oxygen was increased so 4 LPM, but O2 sat did not improve more than 85%, she was then placed on NRB at 10 L with O2 sat at 92%.  Patient was then placed on BiPAP by ED physician and IV nitroglycerin was started.  Hospitalist was asked to admit patient for further evaluation and management.  Review of Systems: Constitutional: Negative for  chills and fever.  HENT: Negative for ear pain and sore throat.   Eyes: Negative for pain and visual disturbance.  Respiratory: Positive for shortness of breath, cough (dry) and wheezing.  Cardiovascular: Negative for chest pain and palpitations.  Gastrointestinal: Negative for abdominal pain and vomiting.  Endocrine: Negative for polyphagia and polyuria.  Genitourinary: Negative for decreased urine volume, dysuria, enuresis Musculoskeletal: Positive for increased leg swelling (L> ).  Negative for arthralgias and back pain.  Skin: Negative for color change and rash.  Allergic/Immunologic: Negative for immunocompromised state.  Neurological: Negative for tremors, syncope, speech difficulty, weakness, light-headedness and headaches.  Hematological: Does not bruise/bleed easily.  All other systems reviewed and are negative    Past Medical History:  Diagnosis Date  . Hypertension    Past Surgical History:  Procedure Laterality Date  . CHOLECYSTECTOMY      Social History:  reports that she has been smoking cigarettes. She has been smoking about 1.00 pack per day. She has never used smokeless tobacco. She reports current alcohol use. She reports that she does not use drugs.   No Known Allergies  No family history on file.    Prior to Admission medications   Medication Sig Start Date End Date Taking? Authorizing Provider  acetaminophen (TYLENOL) 325 MG tablet Take 650 mg by mouth every 6 (six) hours as needed for moderate pain or headache.    [provider]  ibuprofen (ADVIL,MOTRIN) 200 MG tablet Take 200 mg by mouth every 6 (  six) hours as needed for headache or moderate pain.    [provider]  lisinopril (PRINIVIL,ZESTRIL) 10 MG tablet Take 1 tablet (10 mg total) by mouth daily. 12/20/17   Pricilla Loveless, MD  potassium chloride SA (K-DUR,KLOR-CON) 20 MEQ tablet Take 1 tablet (20 mEq total) by mouth 2 (two) times daily for 5 days. 12/20/17 12/25/17  Pricilla Loveless,  MD    Physical Exam: BP (!) 194/108 (BP Location: Left Arm)   Pulse 99   Temp 98.2 F (36.8 C) (Oral)   Resp (!) 24   Ht 5\' 2"  (1.575 m)   Wt 44.5 kg   SpO2 100%   BMI 17.92 kg/m   . General: 64 y.o. year-old female cachectic in some acute respiratory distress.  Alert and oriented x3. 64 HEENT: NCAT, EOMI . Neck: Supple, trachea media . Cardiovascular: Regular rate and rhythm with no rubs or gallops.  Increased JVD noted.   lower extremity edema with L >R. 2/4 pulses in all 4 extremities. Marland Kitchen Respiratory: Mild diffuse wheezing on auscultation.   . Abdomen: Soft nontender nondistended with normal bowel sounds x4 quadrants. . Muskuloskeletal: No cyanosis or clubbing noted bilaterally . Neuro: CN II-XII intact, strength, sensation, reflexes . Skin: No ulcerative lesions noted or rashes . Psychiatry: Judgement and insight appear normal. Mood is appropriate for condition and setting          Labs on Admission:  Basic Metabolic Panel: Recent Labs  Lab 04/19/20 0255 04/19/20 0447  NA 141  --   K 2.5* 2.3*  CL 103  --   CO2 27  --   GLUCOSE 109*  --   BUN 19  --   CREATININE 0.67  --   CALCIUM 8.8*  --   MG 1.7  --    Liver Function Tests: Recent Labs  Lab 04/19/20 0255  AST 31  ALT 30  ALKPHOS 87  BILITOT 0.8  PROT 7.2  ALBUMIN 3.3*   No results for input(s): LIPASE, AMYLASE in the last 168 hours. No results for input(s): AMMONIA in the last 168 hours. CBC: Recent Labs  Lab 04/19/20 0255  WBC 9.4  NEUTROABS 7.0  HGB 8.8*  HCT 27.1*  MCV 91.9  PLT 146*   Cardiac Enzymes: No results for input(s): CKTOTAL, CKMB, CKMBINDEX, TROPONINI in the last 168 hours.  BNP (last 3 results) Recent Labs    04/19/20 0255  BNP 1,081.0*    ProBNP (last 3 results) No results for input(s): PROBNP in the last 8760 hours.  CBG: No results for input(s): GLUCAP in the last 168 hours.  Radiological Exams on Admission: DG Chest Portable 1 View  Result Date:  04/19/2020 CLINICAL DATA:  Dyspnea, nonproductive cough EXAM: PORTABLE CHEST 1 VIEW COMPARISON:  None. FINDINGS: The lungs are symmetrically hyperinflated in keeping with changes of underlying COPD. There is more focal infiltrate noted at the right lung base, possibly infectious or inflammatory in the acute setting. No pneumothorax or pleural effusion. Cardiac size within normal limits. Pulmonary vascularity is normal. IMPRESSION: COPD. Right basilar focal pulmonary infiltrate, likely infectious in the appropriate clinical setting. Electronically Signed   By: 04/21/2020 MD   On: 04/19/2020 04:06    EKG: I independently viewed the EKG done and my findings are as followed: Sinus tachycardia heart rate of 110 bpm with LVH  Assessment/Plan Present on Admission: **None**  Principal Problem:   Acute CHF (congestive heart failure) (HCC) Active Problems:   COPD (chronic obstructive pulmonary disease) (HCC)  Essential hypertension   Hypertensive emergency   Hypokalemia   Localized swelling of left lower extremity   Hypoalbuminemia   Elevated brain natriuretic peptide (BNP) level   Elevated troponin I level   Thrombocytopenia (HCC)   Tobacco abuse   Noncompliance with medication regimen  Acute respiratory failure with hypoxia possibly secondary to multifactorial including acute CHF with superimposed presumed COPD exacerbation R/O PE Patient presents with orthopnea, increased JVD, bilateral ankle swelling He has history of hypertension and was not taking any medication, EKG personally reviewed showed sinus tachycardia at rate of 110 bpm with LVH BNP was elevated at 1081 Continue total input/output, daily weights and fluid restriction IV Lasix 40 twice daily will be initiated once hypokalemia is improved Continue low-sodium diet and heart healthy diet Echocardiogram in the morning  Continue duo nebs, Mucinex, Solu-Medrol, azithromycin. Continue Protonix to prevent steroid-induced  ulcer Continue incentive spirometry and flutter valve when patient is able to tolerate Continue BiPAP with plan to wean patient off and transition to supplemental oxygen to maintain O2 sat > 92% with eventual plan to wean patient off oxygen as tolerated (patient was not on oxygen at baseline) Patient will need an outpatient PFTs to officially diagnose COPD CT angiography of chest with contrast will be done to rule out PE  Elevated troponin rule out NSTEMI Troponin x2-58 > 75 Continue to trend troponin level Cardiology will be consulted for further recommendation   Hypertensive emergency/essential hypertension BP on arrival to the ED was 230/107 Patient was started on IV nitroglycerin drip, we shall continue with same at this time with plan to transition patient to oral and hypertensive medication  Left lower extremity swelling rule out DVT Lower extremity ultrasound will be done to rule out DVT  Hypokalemia K+2.5>2.3, this will be replenished  Hypoalbuminemia possibly secondary to mild protein calorie malnutrition Albumin 3.3; protein supplement will be provided  Tobacco abuse Patient has 45-pack-year smoking history She was counseled on tobacco abuse cessation  Noncompliance with medication regimen Patient was counseled on importance of being compliant with medication regimen She states that she does not have any PCP; consider social worker consult prior to discharge to ensure the patient will be able to follow-up with an outpatient PCP and medications   DVT prophylaxis: Lovenox  Code Status: Full code  Family Communication: None at bedside  Disposition Plan:  Patient is from:                        home Anticipated DC to:                   SNF or family members home Anticipated DC date:               2-3 days Anticipated DC barriers:           Patient is unstable to be discharged at this time due to acute respiratory failure with hypoxia secondary to CHF and/or COPD which  require inpatient treatment and further work-up.   Consults called: Cardiology  Admission status: Inpatient    Frankey Shown MD [facility]  04/19/2020, 6:25 AM

## 2020-04-19 NOTE — Consult Note (Addendum)
Cardiology Consult    Patient ID: MERIEM LEMIEUX; [id number]; 1956/05/19   Admit date: 04/19/2020 Date of Consult: 04/19/2020  Primary Care Provider: Patient, No Pcp Per Primary Cardiologist: New to [facility] - Dr. Wyline Mood  Patient Profile    Sarah Bonilla is a 64 y.o. female with past medical history of HTN, tobacco use and no prior cardiac history who is being seen today for the evaluation of CHF and elevated troponin at the request of Dr. Thomes Dinning.   History of Present Illness    Sarah Bonilla presented to [facility] ED during the early morning hours of 04/19/2020 for evaluation of worsening dyspnea and a nonproductive cough over the past several days. She reports being in her usual state of health until earlier this week when she started to experience worsening dyspnea on exertion along with orthopnea and PND. Last night, she had to get up from bed and walk around to see if this would help with her breathing and it did temporarily but then she developed worsening dyspnea. She denies any associated chest pain or palpitations. She has experienced abdominal distention and lower extremity edema. Says she was diagnosed with hypertension many years ago but has not been on medical therapy for at least 10+ years. No known history of CAD or CHF. Reports her father had known hypertension and her mother had a CVA. No known family history of cardiac issues. She does smoke approximately 1 pack/day.  She was hypoxic upon arrival with oxygen saturations in the 80's on room air and this improved into the 90's with 2L [place]. Ultimately desaturated again and now on BiPAP.   BP was initially elevated to 230/107. Initial labs show WBC 9.4, Hgb 8.8 (no recent values available for comparison), platelets 146, Na+ 141, K+ 2.5 and creatinine 0.67. BNP 1081. Mg 1.7. Initial HS Troponin 58 with repeat of 75. CXR showed COPD and right basilar focal pulmonary infiltrate. EKG shows sinus tachycardia, HR 110 with LVH and  nonspecific ST abnormalities along the lateral leads which is most consistent with repol abnormalities.   She was started on IV NTG for BP control and has also received K+ supplementation. Was started on PO Lisinopril 10mg  daily.   Past Medical History:  Diagnosis Date  . Hypertension     Past Surgical History:  Procedure Laterality Date  . CHOLECYSTECTOMY       Home Medications:  Prior to Admission medications   Medication Sig Start Date End Date Taking? Authorizing Provider  acetaminophen (TYLENOL) 325 MG tablet Take 650 mg by mouth every 6 (six) hours as needed for moderate pain or headache.    [provider]  ibuprofen (ADVIL,MOTRIN) 200 MG tablet Take 200 mg by mouth every 6 (six) hours as needed for headache or moderate pain.    [provider]  lisinopril (PRINIVIL,ZESTRIL) 10 MG tablet Take 1 tablet (10 mg total) by mouth daily. 12/20/17   12/22/17, MD  potassium chloride SA (K-DUR,KLOR-CON) 20 MEQ tablet Take 1 tablet (20 mEq total) by mouth 2 (two) times daily for 5 days. 12/20/17 12/25/17  12/27/17, MD    Inpatient Medications: Scheduled Meds: . azithromycin  500 mg Oral Daily   Followed by  . [START ON 04/20/2020] azithromycin  250 mg Oral Daily  . dextromethorphan-guaiFENesin  1 tablet Oral BID  . enoxaparin (LOVENOX) injection  40 mg Subcutaneous Q24H  . feeding supplement  237 mL Oral BID BM  . ipratropium-albuterol  3 mL Nebulization Q6H  .  lisinopril  10 mg Oral Daily  . methylPREDNISolone (SOLU-MEDROL) injection  20 mg Intravenous Q12H  . pantoprazole (PROTONIX) IV  40 mg Intravenous Daily   Continuous Infusions: . nitroGLYCERIN 20 mcg/min (04/19/20 0756)   PRN Meds: iohexol, ipratropium-albuterol  Allergies:   No Known Allergies  Social History:   Social History   Socioeconomic History  . Marital status: Widowed    Spouse name: Not on file  . Number of children: Not on file  . Years of education: Not on file  .  Highest education level: Not on file  Occupational History  . Not on file  Tobacco Use  . Smoking status: Current Every Day Smoker    Packs/day: 1.00    Types: Cigarettes  . Smokeless tobacco: Never Used  Vaping Use  . Vaping Use: Never used  Substance and Sexual Activity  . Alcohol use: Yes    Comment: occasionally   . Drug use: Never  . Sexual activity: Not on file  Other Topics Concern  . Not on file  Social History Narrative  . Not on file   Social Determinants of Health   Financial Resource Strain:   . Difficulty of Paying Living Expenses: Not on file  Food Insecurity:   . Worried About Programme researcher, broadcasting/film/video in the Last Year: Not on file  . Ran Out of Food in the Last Year: Not on file  Transportation Needs:   . Lack of Transportation (Medical): Not on file  . Lack of Transportation (Non-Medical): Not on file  Physical Activity:   . Days of Exercise per Week: Not on file  . Minutes of Exercise per Session: Not on file  Stress:   . Feeling of Stress : Not on file  Social Connections:   . Frequency of Communication with Friends and Family: Not on file  . Frequency of Social Gatherings with Friends and Family: Not on file  . Attends Religious Services: Not on file  . Active Member of Clubs or Organizations: Not on file  . Attends Banker Meetings: Not on file  . Marital Status: Not on file  Intimate Partner Violence:   . Fear of Current or Ex-Partner: Not on file  . Emotionally Abused: Not on file  . Physically Abused: Not on file  . Sexually Abused: Not on file     Family History:   Family History  Problem Relation Age of Onset  . CVA Mother   . Hypertension Father       Review of Systems    General:  No chills, fever, night sweats or weight changes.  Cardiovascular:  No chest pain or palpitations. Positive for dyspnea on exertion, edema and orthopnea.  Dermatological: No rash, lesions/masses Respiratory: Positive for dry cough and  dyspnea. Urologic: No hematuria, dysuria Abdominal:   No nausea, vomiting, diarrhea, bright red blood per rectum, melena, or hematemesis Neurologic:  No visual changes, wkns, changes in mental status. All other systems reviewed and are otherwise negative except as noted above.  Physical Exam/Data    Vitals:   04/19/20 0600 04/19/20 0657 04/19/20 0700 04/19/20 0730  BP: (!) 194/108 (!) 191/119 (!) 217/129 (!) 187/114  Pulse: 99 98 (!) 108 96  Resp: (!) 24 (!) 26 (!) 27 (!) 21  Temp:      TempSrc:      SpO2: 100% 100% 100% 100%  Weight:      Height:       No intake or output data in  the 24 hours ending 04/19/20 0829 Filed Weights   04/19/20 0233  Weight: 44.5 kg   Body mass index is 17.92 kg/m.   General: Pleasant thin female. Currently on BiPAP.  Psych: Normal affect. Neuro: Alert and oriented X 3. Moves all extremities spontaneously. HEENT: Normal  Neck: Supple without bruits. JVD at 10 cm. Lungs:  Resp regular and unlabored, rales along bases and rhonchi throughout. On BiPAP. Heart: Regular rhythm, tachycardiac rate. no s3, s4, or murmurs. Abdomen: Soft, non-tender, non-distended, BS + x 4.  Extremities: No clubbing or cyanosis. 1+ pitting edema bilaterally. DP/PT/Radials 2+ and equal bilaterally.   EKG:  The EKG was personally reviewed and demonstrates: Sinus tachycardia, HR 110 with LVH and nonspecific ST abnormalities along the lateral leads which is most consistent with repol abnormalities.   Telemetry:  Telemetry was personally reviewed and demonstrates: Sinus tachycardia, HR in 100's to 110's with occasional PVC's.    Labs/Studies     Relevant CV Studies:  None on File  Laboratory Data:  Chemistry Recent Labs  Lab 04/19/20 0255 04/19/20 0447  NA 141  --   K 2.5* 2.3*  CL 103  --   CO2 27  --   GLUCOSE 109*  --   BUN 19  --   CREATININE 0.67  --   CALCIUM 8.8*  --   GFRNONAA >60  --   ANIONGAP 11  --     Recent Labs  Lab 04/19/20 0255  PROT  7.2  ALBUMIN 3.3*  AST 31  ALT 30  ALKPHOS 87  BILITOT 0.8   Hematology Recent Labs  Lab 04/19/20 0255  WBC 9.4  RBC 2.95*  HGB 8.8*  HCT 27.1*  MCV 91.9  MCH 29.8  MCHC 32.5  RDW 14.3  PLT 146*   Cardiac EnzymesNo results for input(s): TROPONINI in the last 168 hours. No results for input(s): TROPIPOC in the last 168 hours.  BNP Recent Labs  Lab 04/19/20 0255  BNP 1,081.0*    DDimer No results for input(s): DDIMER in the last 168 hours.  Radiology/Studies:  DG Chest Portable 1 View  Result Date: 04/19/2020 CLINICAL DATA:  Dyspnea, nonproductive cough EXAM: PORTABLE CHEST 1 VIEW COMPARISON:  None. FINDINGS: The lungs are symmetrically hyperinflated in keeping with changes of underlying COPD. There is more focal infiltrate noted at the right lung base, possibly infectious or inflammatory in the acute setting. No pneumothorax or pleural effusion. Cardiac size within normal limits. Pulmonary vascularity is normal. IMPRESSION: COPD. Right basilar focal pulmonary infiltrate, likely infectious in the appropriate clinical setting. Electronically Signed   By: Helyn Numbers MD   On: 04/19/2020 04:06     Assessment & Plan    1. Acute CHF Exacerbation (Echocardiogram pending to determine Systolic versus Diastolic Dysfunction) - She presented with worsening dyspnea on exertion, orthopnea, lower extremity edema and abdominal distention for the past week. BNP found to be elevated to 1081 and CXR showed COPD and possible infiltrate. - An echocardiogram is pending to assess EF and wall motion. She is at risk for a hypertensive induced cardiomyopathy given her uncontrolled blood pressure as she reports not being on medical therapy for 10+ years. - IV Lasix has not yet been administered given her significant hypokalemia (K+ still low at 2.3 by repeat labs at 0447 with additional supplementation ordered). Once K+ has improved to at least greater than 3, would recommend starting IV Lasix 40  mg twice daily and following I's and O's along with daily  weights and continuing with K+ supplementation to keep K+ ~ 4.0. Further medication recommendations pending echocardiogram results.  2. Elevated Troponin Values - Initial HS Troponin 58 with repeat of 75. EKG shows sinus tachycardia, HR 110 with LVH and nonspecific ST abnormalities along the lateral leads which is most consistent with repol abnormalities.   - She denies any recent chest pain or history of CAD.   - Suspect her enzyme elevation is secondary to demand ischemia in the setting of CHF and hypertensive urgency. An echocardiogram is pending for further evaluation. If her EF is found to be reduced, would anticipate medical therapy for possible hypertensive cardiomyopathy with repeat imaging in several months once compliant with medical therapy.   3. Hypertensive Emergency - BP was initially elevated to 230/107. She has been started on IV nitroglycerin. Lisinopril 10 mg ordered for later this morning but she is currently NPO given the use of BiPAP. If this can be weaned, would anticipate transitioning to PO medications later today (echo would help guide medication choices). Avoid BB for now given COPD exacerbation.   4. Hypokalemia -  K+ 2.3 on most recent check with additional supplementation ordered.   5. COPD Exacerbation/Tobacco Use - CXR showed COPD and right basilar focal pulmonary infiltrate. She has been started on antibiotic therapy along with scheduled nebulizers and steroids. Will need PFT's as an outpatient. Further management per admitting team.   For questions or updates, please contact [facility] Please consult [email or web address] for contact info under Cardiology/STEMI.  Signed, Ellsworth LennoxBrittany M Strader, PA-C 04/19/2020, 8:29 AM Pager: [phone]  Attending note Patient seen and disucssed with PA Iran OuchStrader, I agree with her documentation. 64 yo female history of HTN and medication noncompliance, tobacco abuse, has not  seen a physician in a very long time presents with SOB and leg swelling, orthopnea.    WBC 9.4 Hgb 8.8 Plt 146 K 2.5 BUN 19 BNP 1081 Mg 1.7  COVID neg Trop 58-->75-->78 ABG 7.4/38/120/25 on 100% FiO2 CXR COPD changes, right basilar focal infiltrate EKG SR, severe LVH  SOB with ongoing workup including upcoming CT PE. CXR also with possible infiltrate though normwal WBC and febrile. Follow up CT imaging, could consider procalcitonin pending CT findings. Evidence that new onset HF is playing at least some role in her presentation given LE edema, orthopnea, BNP 1000. She has severe HTN in ER, had not seen physicians as outpatient so likely long standing severe HTN at home increasing risk for hypertensive heart disease, her severe LVH on EKG would support this  . Started on NG gtt in ER. Had been waiting on K to improve before IV lasix but since still on bipap go ahead and dose IV lasix 60mg  x 1 now. Ongoing HTN on NG drip, dose IV labetalol 10mg  x 1. Once off bipap can start oral regimen and titrate. She likely has lived at this bp for quite some time, a gradual decline would be reasonable, would avoid a rapid correction.  Anemia per primary team  Dominga FerryJ Jeyson Deshotel MD

## 2020-04-19 NOTE — ED Notes (Signed)
Pt placed on purewick per request. 

## 2020-04-19 NOTE — Progress Notes (Signed)
Per HPI: Sarah Bonilla is a 64 y.o. female with medical history significant for hypertension (noncompliant with medication) and tobacco abuse who presents to the emergency department due to 3-4 day onset of shortness of breath and leg swelling (L > R).  Shortness of breath worsens with lying flat in bed and improves on sitting, is associated with dry cough.  Patient does not have a PCP and has not been to any doctor for medical checkup in several years.  She endorsed 45-pack-year smoking history.  Patient denies chest pain, fever, chills, nausea, vomiting or abdominal pain.  -Patient has been admitted after midnight and I have seen and evaluated the patient at bedside.  She has been admitted with acute hypoxemic respiratory failure that appears multifactorial in the setting of acute CHF exacerbation with possible superimposed COPD exacerbation.  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  Mild troponin elevation appears to be related to demand ischemia.  She is also noted to have hypertensive crisis and has been started on nitroglycerin drip.  She was noted to be hypokalemic on admission and this is improving on repeat check this morning.  Continue to follow labs.  Wean off nitroglycerin drip with oral medications as tolerated and plan to wean BiPAP as tolerated.  Appreciate cardiology consultation.  Total care time: 35 minutes.

## 2020-04-19 NOTE — ED Triage Notes (Signed)
Pt reports shortness of breath that started yesterday. Pt also says she has been having "hot flashes". Pt has had a non productive cough. Pt is 87% on room air in triage.

## 2020-04-19 NOTE — Progress Notes (Signed)
  Echocardiogram 2D Echocardiogram has been performed.  Sarah Bonilla 04/19/2020, 10:42 AM

## 2020-04-19 NOTE — ED Notes (Signed)
Pt called out c/o not being able to breathe. Upon entering room, pt was tripoding on side of bed and her O2 was 75% on 2L [place]. Pt stated that she suddenly got hot and SOB. O2 was turned up to 4L and O2 did not go higher than 85%. Pt was switched over to 10L on NRB. O2 is now 92%.   Dr. Clayborne Dana was made aware. RT was also called for bipap, per Dr. Danielle Rankin orders.

## 2020-04-19 NOTE — ED Notes (Addendum)
Pt placed on 2L [place] d/t O2 being 87-89% on room air. O2 is now 92% on 2L. Will continue to monitor.

## 2020-04-19 NOTE — ED Provider Notes (Signed)
[facility] EMERGENCY DEPARTMENT Provider Note   CSN: [id number] Arrival date & time: 04/19/20  5361     History Chief Complaint  Patient presents with  . Shortness of Breath    Sarah Bonilla is a 64 y.o. female.   Shortness of Breath Severity:  Mild Onset quality:  Gradual Duration:  4 days Timing:  Constant Progression:  Worsening Chronicity:  New Context: activity   Relieved by:  None tried Worsened by:  Nothing Ineffective treatments:  None tried Associated symptoms: cough (dry)   Associated symptoms: no abdominal pain, no fever, no sputum production, no vomiting and no wheezing        Past Medical History:  Diagnosis Date  . Hypertension     There are no problems to display for this patient.   Past Surgical History:  Procedure Laterality Date  . CHOLECYSTECTOMY       OB History   No obstetric history on file.     No family history on file.  Social History   Tobacco Use  . Smoking status: Current Every Day Smoker    Packs/day: 1.00    Types: Cigarettes  . Smokeless tobacco: Never Used  Vaping Use  . Vaping Use: Never used  Substance Use Topics  . Alcohol use: Yes    Comment: occasionally   . Drug use: Never    Home Medications Prior to Admission medications   Medication Sig Start Date End Date Taking? Authorizing Provider  acetaminophen (TYLENOL) 325 MG tablet Take 650 mg by mouth every 6 (six) hours as needed for moderate pain or headache.    [provider]  ibuprofen (ADVIL,MOTRIN) 200 MG tablet Take 200 mg by mouth every 6 (six) hours as needed for headache or moderate pain.    [provider]  lisinopril (PRINIVIL,ZESTRIL) 10 MG tablet Take 1 tablet (10 mg total) by mouth daily. 12/20/17   Pricilla Loveless, MD  potassium chloride SA (K-DUR,KLOR-CON) 20 MEQ tablet Take 1 tablet (20 mEq total) by mouth 2 (two) times daily for 5 days. 12/20/17 12/25/17  Pricilla Loveless, MD    Allergies    Patient has no known  allergies.  Review of Systems   Review of Systems  Constitutional: Negative for fever.  Respiratory: Positive for cough (dry) and shortness of breath. Negative for sputum production and wheezing.   Gastrointestinal: Negative for abdominal pain and vomiting.  All other systems reviewed and are negative.   Physical Exam Updated Vital Signs BP (!) 192/93   Pulse 99   Temp 98.2 F (36.8 C) (Oral)   Resp 19   Ht 5\' 2"  (1.575 m)   Wt 44.5 kg   SpO2 93%   BMI 17.92 kg/m   Physical Exam Vitals and nursing note reviewed.  Constitutional:      Appearance: She is well-developed.  HENT:     Head: Normocephalic and atraumatic.     Nose: No congestion or rhinorrhea.     Mouth/Throat:     Pharynx: Oropharynx is clear.  Eyes:     Pupils: Pupils are equal, round, and reactive to light.  Cardiovascular:     Rate and Rhythm: Normal rate and regular rhythm.  Pulmonary:     Effort: No respiratory distress.     Breath sounds: No stridor. Decreased breath sounds present. No wheezing.  Abdominal:     General: There is no distension.  Musculoskeletal:     Cervical back: Normal range of motion.     Right lower  leg: Edema present.     Left lower leg: Edema present.  Skin:    General: Skin is warm and dry.  Neurological:     General: No focal deficit present.     Mental Status: She is alert.     ED Results / Procedures / Treatments   Labs (all labs ordered are listed, but only abnormal results are displayed) Labs Reviewed  CBC WITH DIFFERENTIAL/PLATELET - Abnormal; Notable for the following components:      Result Value   RBC 2.95 (*)    Hemoglobin 8.8 (*)    HCT 27.1 (*)    Platelets 146 (*)    All other components within normal limits  COMPREHENSIVE METABOLIC PANEL - Abnormal; Notable for the following components:   Potassium 2.5 (*)    Glucose, Bld 109 (*)    Calcium 8.8 (*)    Albumin 3.3 (*)    All other components within normal limits  BRAIN NATRIURETIC PEPTIDE -  Abnormal; Notable for the following components:   B Natriuretic Peptide 1,081.0 (*)    All other components within normal limits  TROPONIN I (HIGH SENSITIVITY) - Abnormal; Notable for the following components:   Troponin I (High Sensitivity) 58 (*)    All other components within normal limits  RESP PANEL BY RT-PCR (FLU A&B, COVID) ARPGX2  MAGNESIUM  POTASSIUM  TYPE AND SCREEN  TROPONIN I (HIGH SENSITIVITY)    EKG EKG Interpretation  Date/Time:  Friday April 19 2020 02:51:12 EST Ventricular Rate:  110 PR Interval:    QRS Duration: 70 QT Interval:  347 QTC Calculation: 470 R Axis:   59 Text Interpretation: Sinus tachycardia Probable left atrial enlargement LVH with secondary repolarization abnormality given baseline wander, no obvious abnormalities. Confirmed by Marily Memos [phone]) on 04/19/2020 3:37:31 AM   Radiology DG Chest Portable 1 View  Result Date: 04/19/2020 CLINICAL DATA:  Dyspnea, nonproductive cough EXAM: PORTABLE CHEST 1 VIEW COMPARISON:  None. FINDINGS: The lungs are symmetrically hyperinflated in keeping with changes of underlying COPD. There is more focal infiltrate noted at the right lung base, possibly infectious or inflammatory in the acute setting. No pneumothorax or pleural effusion. Cardiac size within normal limits. Pulmonary vascularity is normal. IMPRESSION: COPD. Right basilar focal pulmonary infiltrate, likely infectious in the appropriate clinical setting. Electronically Signed   By: Helyn Numbers MD   On: 04/19/2020 04:06    Procedures .Critical Care Performed by: Marily Memos, MD Authorized by: Marily Memos, MD   Critical care provider statement:    Critical care time (minutes):  45   Critical care was necessary to treat or prevent imminent or life-threatening deterioration of the following conditions:  Respiratory failure and metabolic crisis   Critical care was time spent personally by me on the following activities:  Discussions with  consultants, evaluation of patient's response to treatment, examination of patient, ordering and performing treatments and interventions, ordering and review of laboratory studies, ordering and review of radiographic studies, pulse oximetry, re-evaluation of patient's condition, obtaining history from patient or surrogate and review of old charts   (including critical care time)  Medications Ordered in ED Medications  potassium chloride 10 mEq in 100 mL IVPB (10 mEq Intravenous New Bag/Given 04/19/20 0435)  magnesium sulfate IVPB 2 g 50 mL (2 g Intravenous New Bag/Given 04/19/20 0435)  hydrALAZINE (APRESOLINE) tablet 25 mg (25 mg Oral Given 04/19/20 0424)  potassium chloride SA (KLOR-CON) CR tablet 80 mEq (80 mEq Oral Given 04/19/20 0436)  ED Course  I have reviewed the triage vital signs and the nursing notes.  Pertinent labs & imaging results that were available during my care of the patient were reviewed by me and considered in my medical decision making (see chart for details).  Clinical Course as of Apr 19 2305  Fri Apr 19, 2020  0076 Appears to have some interstitial edema, maybe small effusions  DG Chest Portable 1 View [JM]  0322 Likely flash pulmonary edema  BP(!): 230/107 [JM]  0322 SpO2(!): 89 % [JM]  0322 Resp(!): 31 [JM]  0322 My ECG Read Indication:sob EKG was personally contemporaneously reviewed by myself. Rate: 110 PR Interval: 106 QRS duration: 70 QT/QTC: 347/470 Axis: normal EKG: nonspecific ST and T waves changes, sinus tachycardia, LVH with likely repol. Other significant findings: n/a   EKG 12-Lead [JM]  0323 Noted. Doubt this is cause for dyspnea at this time but could definitely be contributing.  Hemoglobin(!): 8.8 [JM]  0528 Not lab error, already being repleted  Potassium(!!): 2.3 [JM]  0528 Apparently patient had an acute worsening of her dyspnea with worsening hypoxia. Will start biap/NTG infusion.   [JM]    Clinical Course User Index [JM]  Nyia Tsao, Barbara Cower, MD   MDM Rules/Calculators/A&P                          Given the above findings overall I think the patient likely has pulmonary edema.  In healthy she has an infectious cause without a fever or productive cough.  She does have pretty significant hypokalemia so I am hold on Lasix at this time.  We will going go with blood pressure control.  Recheck her potassium demonstrates actually a correct value and recheck her hemoglobin for the same reason.  Discussed with hospitalist who will admit.  Final Clinical Impression(s) / ED Diagnoses Final diagnoses:  Hypokalemia  Hypertension, unspecified type  Acute pulmonary edema (HCC)  Hypoxia    Rx / DC Orders ED Discharge Orders    None       Avigail Pilling, Barbara Cower, MD 04/19/20 2306

## 2020-04-19 NOTE — ED Notes (Signed)
Pt state she is feeling much better. States she is hungry. Lunch tray heated and provided with juice. She is much more alert and in good spirits.

## 2020-04-19 NOTE — ED Notes (Signed)
Removed from Bipap at this time and placed on 2lpm [place]. RR 20 and SPO2 is 9%-100% at this time. RN notified.

## 2020-04-19 NOTE — ED Notes (Signed)
Date and time results received: 04/19/20 4:04 AM(use smartphrase ".now" to insert current time)  Test: potassium Critical Value: 2.5  Name of Provider Notified: Dr Hortencia Conradi  Orders Received? Or Actions Taken?: see chart

## 2020-04-20 LAB — COMPREHENSIVE METABOLIC PANEL
ALT: 32 U/L (ref 0–44)
AST: 26 U/L (ref 15–41)
Albumin: 2.9 g/dL — ABNORMAL LOW (ref 3.5–5.0)
Alkaline Phosphatase: 72 U/L (ref 38–126)
Anion gap: 8 (ref 5–15)
BUN: 22 mg/dL (ref 8–23)
CO2: 29 mmol/L (ref 22–32)
Calcium: 8.9 mg/dL (ref 8.9–10.3)
Chloride: 103 mmol/L (ref 98–111)
Creatinine, Ser: 0.56 mg/dL (ref 0.44–1.00)
GFR, Estimated: >60 mL/min (ref >60)
Glucose, Bld: 144 mg/dL — ABNORMAL HIGH (ref 70–99)
Potassium: 3.5 mmol/L (ref 3.5–5.1)
Sodium: 140 mmol/L (ref 135–145)
Total Bilirubin: 0.8 mg/dL (ref 0.3–1.2)
Total Protein: 6 g/dL — ABNORMAL LOW (ref 6.5–8.1)

## 2020-04-20 LAB — CBC
HCT: 23.6 % — ABNORMAL LOW (ref 36.0–46.0)
Hemoglobin: 7.5 g/dL — ABNORMAL LOW (ref 12.0–15.0)
MCH: 29 pg (ref 26.0–34.0)
MCHC: 31.8 g/dL (ref 30.0–36.0)
MCV: 91.1 fL (ref 80.0–100.0)
Platelets: 140 10*3/uL — ABNORMAL LOW (ref 150–400)
RBC: 2.59 MIL/uL — ABNORMAL LOW (ref 3.87–5.11)
RDW: 14.2 % (ref 11.5–15.5)
WBC: 7.2 10*3/uL (ref 4.0–10.5)
nRBC: 0 % (ref 0.0–0.2)

## 2020-04-20 LAB — RETICULOCYTES
Immature Retic Fract: 11.7 % (ref 2.3–15.9)
RBC.: 2.89 MIL/uL — ABNORMAL LOW (ref 3.87–5.11)
Retic Count, Absolute: 75.1 10*3/uL (ref 19.0–186.0)
Retic Ct Pct: 2.6 % (ref 0.4–3.1)

## 2020-04-20 LAB — MAGNESIUM: Magnesium: 2 mg/dL (ref 1.7–2.4)

## 2020-04-20 LAB — HEMOGLOBIN AND HEMATOCRIT, BLOOD
HCT: 26.2 % — ABNORMAL LOW (ref 36.0–46.0)
Hemoglobin: 8.6 g/dL — ABNORMAL LOW (ref 12.0–15.0)

## 2020-04-20 LAB — PROTIME-INR
INR: 1.2 (ref 0.8–1.2)
Prothrombin Time: 14.3 seconds (ref 11.4–15.2)

## 2020-04-20 LAB — IRON AND TIBC
Iron: 43 ug/dL (ref 28–170)
Saturation Ratios: 17 % (ref 10.4–31.8)
TIBC: 258 ug/dL (ref 250–450)
UIBC: 215 ug/dL

## 2020-04-20 LAB — VITAMIN B12: Vitamin B-12: 194 pg/mL (ref 180–914)

## 2020-04-20 LAB — FERRITIN: Ferritin: 152 ng/mL (ref 11–307)

## 2020-04-20 LAB — FOLATE: Folate: 10.1 ng/mL (ref >5.9)

## 2020-04-20 LAB — APTT: aPTT: 31 seconds (ref 24–36)

## 2020-04-20 LAB — PHOSPHORUS: Phosphorus: 3.5 mg/dL (ref 2.5–4.6)

## 2020-04-20 LAB — PROCALCITONIN: Procalcitonin: <0.1 ng/mL

## 2020-04-20 LAB — MRSA PCR SCREENING: MRSA by PCR: NEGATIVE

## 2020-04-20 MED ORDER — IPRATROPIUM-ALBUTEROL 0.5-2.5 (3) MG/3ML IN SOLN
3.0000 mL | Freq: Three times a day (TID) | RESPIRATORY_TRACT | Status: DC
  Administered 2020-04-20 (×2): 3 mL via RESPIRATORY_TRACT
  Filled 2020-04-20 (×2): qty 3

## 2020-04-20 MED ORDER — LABETALOL HCL 5 MG/ML IV SOLN
10.0000 mg | INTRAVENOUS | Status: DC | PRN
  Administered 2020-04-20 – 2020-04-24 (×11): 10 mg via INTRAVENOUS
  Filled 2020-04-20 (×11): qty 4

## 2020-04-20 MED ORDER — METOPROLOL TARTRATE 25 MG PO TABS
25.0000 mg | ORAL_TABLET | Freq: Two times a day (BID) | ORAL | Status: DC
  Administered 2020-04-20 – 2020-04-22 (×5): 25 mg via ORAL
  Filled 2020-04-20 (×6): qty 1

## 2020-04-20 NOTE — Progress Notes (Signed)
PROGRESS NOTE    Sarah Bonilla  ZOX:[id number] DOB: 1955/10/11 DOA: 04/19/2020 PCP: Patient, No Pcp Per   Brief Narrative:  Per HPI: Sarah Bonilla a 64 y.o.femalewith medical history significant forhypertension (noncompliant with medication) and tobacco abuse who presents to the emergency department due to 3-4 day onset of shortness of breath and leg swelling(L > R). Shortness of breath worsens with lying flat in bed and improves on sitting, is associated with dry cough. Patient does not have a PCP and has not been to any doctor for medical checkup in several years. She endorsed 45-pack-year smoking history. Patient denies chest pain, fever, chills, nausea, vomiting or abdominal pain.  -Patient has been admitted with acute hypoxemic respiratory failure likely in the setting of acute diastolic CHF exacerbation as noted on 2D echocardiogram performed 11/19.  She is noted to have a chronic left lower extremity DVT and PE study has returned negative.  She was being treated for potential COPD exacerbation on admission as well, but has no further bronchospasms and has been weaned off BiPAP after Lasix was given yesterday with -1.5 L negative balance over the last 24 hours.  She continues to have elevated blood pressure readings and heart rate and remains on nitroglycerin drip which needs to be further weaned.  Assessment & Plan:   Principal Problem:   Acute CHF (congestive heart failure) (HCC) Active Problems:   COPD (chronic obstructive pulmonary disease) (HCC)   Essential hypertension   Hypertensive emergency   Hypokalemia   Localized swelling of left lower extremity   Hypoalbuminemia   Elevated brain natriuretic peptide (BNP) level   Elevated troponin I level   Thrombocytopenia (HCC)   Tobacco abuse   Noncompliance with medication regimen   Acute hypoxemic respiratory failure secondary to acute diastolic CHF exacerbation -Currently resolved and patient is on room  air -Continue to wean nitroglycerin drip -No further need for Lasix noted as patient does not have any further volume overload -LVEF 55-60% with moderate LVH and grade 3 diastolic dysfunction noted 11/19 -Start beta-blocker with metoprolol twice daily -Discontinue steroids and scheduled breathing treatments as well as azithromycin  Hypertensive crisis with tachycardia -Secondary to medication noncompliance and poor follow-up -Continue to wean off nitroglycerin drip with goal blood pressure 180/100 -Labetalol ordered as needed -Lisinopril given this a.m. with metoprolol twice daily started  Elevated troponin secondary to above -Appears to be demand related and was evaluated by cardiology -2D echocardiogram without wall motion abnormalities  Worsening anemia -No overt bleeding noted -Anemia panel and stool occult ordered -Avoid heparin agents  Chronic left lower extremity DVT -No need for anticoagulation at this time -Recommend follow-up outpatient hematology for this as well as anemia as noted above  History of COPD with tobacco use -Breathing treatments as needed for bronchospasms -No acute exacerbation noted, will discontinue scheduled treatments as well as steroids which are likely exacerbating the tachycardia as well as blood pressure -Patient will need PFTs in outpatient setting with pulmonology follow-up -Counseled on cessation  Thrombocytopenia -Avoid heparin agents and follow-up with hematology outpatient   DVT prophylaxis:SCDs Code Status: Full Family Communication: None at bedside Disposition Plan:  Status is: Inpatient  Remains inpatient appropriate because:IV treatments appropriate due to intensity of illness or inability to take PO and Inpatient level of care appropriate due to severity of illness   Dispo: The patient is from: Home              Anticipated d/c is to: Home  Anticipated d/c date is: 1 day              Patient currently is not  medically stable to d/c.  Consultants:   Cardiology  Procedures:   See below  Antimicrobials:  Anti-infectives (From admission, onward)   Start     Dose/Rate Route Frequency Ordered Stop   04/20/20 1000  azithromycin (ZITHROMAX) tablet 250 mg  Status:  Discontinued       "Followed by" Linked Group Details   250 mg Oral Daily 04/19/20 0639 04/20/20 1517   04/19/20 0800  azithromycin (ZITHROMAX) tablet 500 mg       "Followed by" Linked Group Details   500 mg Oral Daily 04/19/20 0639 04/19/20 0854       Subjective: Patient seen and evaluated today with no new acute complaints or concerns. No acute concerns or events noted overnight.  She states she feels ready to go home and denies any chest pain or shortness of breath.  Objective: Vitals:   04/20/20 1139 04/20/20 1200 04/20/20 1300 04/20/20 1400  BP:  (!) 152/135 (!) 186/87 (!) 179/65  Pulse: (!) 101 (!) 107 (!) 106 (!) 107  Resp: (!) 21 19 (!) 23 (!) 25  Temp: 98.5 F (36.9 C)     TempSrc: Oral     SpO2: 95% 96% 95% 96%  Weight:      Height:        Intake/Output Summary (Last 24 hours) at 04/20/2020 1518 Last data filed at 04/20/2020 1400 Gross per 24 hour  Intake 779.36 ml  Output 400 ml  Net 379.36 ml   Filed Weights   04/19/20 0233 04/20/20 0500  Weight: 44.5 kg 43.6 kg    Examination:  General exam: Appears calm and comfortable  Respiratory system: Clear to auscultation. Respiratory effort normal.  Currently on room air. Cardiovascular system: S1 & S2 heard, RRR.  Gastrointestinal system: Abdomen is nondistended, soft and nontender.  Central nervous system: Alert and oriented. No focal neurological deficits. Extremities: No significant edema Skin: No rashes, lesions or ulcers Psychiatry: Judgement and insight appear normal. Mood & affect appropriate.     Data Reviewed: I have personally reviewed following labs and imaging studies  CBC: Recent Labs  Lab 04/19/20 0255 04/20/20 0502  04/20/20 1330  WBC 9.4 7.2  --   NEUTROABS 7.0  --   --   HGB 8.8* 7.5* 8.6*  HCT 27.1* 23.6* 26.2*  MCV 91.9 91.1  --   PLT 146* 140*  --    Basic Metabolic Panel: Recent Labs  Lab 04/19/20 0255 04/19/20 0447 04/19/20 1048 04/20/20 0502  NA 141  --   --  140  K 2.5* 2.3* 3.4* 3.5  CL 103  --   --  103  CO2 27  --   --  29  GLUCOSE 109*  --   --  144*  BUN 19  --   --  22  CREATININE 0.67  --   --  0.56  CALCIUM 8.8*  --   --  8.9  MG 1.7  --   --  2.0  PHOS  --   --   --  3.5   GFR: Estimated Creatinine Clearance: 49.5 mL/min (by C-G formula based on SCr of 0.56 mg/dL). Liver Function Tests: Recent Labs  Lab 04/19/20 0255 04/20/20 0502  AST 31 26  ALT 30 32  ALKPHOS 87 72  BILITOT 0.8 0.8  PROT 7.2 6.0*  ALBUMIN 3.3* 2.9*  No results for input(s): LIPASE, AMYLASE in the last 168 hours. No results for input(s): AMMONIA in the last 168 hours. Coagulation Profile: Recent Labs  Lab 04/20/20 0502  INR 1.2   Cardiac Enzymes: No results for input(s): CKTOTAL, CKMB, CKMBINDEX, TROPONINI in the last 168 hours. BNP (last 3 results) No results for input(s): PROBNP in the last 8760 hours. HbA1C: No results for input(s): HGBA1C in the last 72 hours. CBG: No results for input(s): GLUCAP in the last 168 hours. Lipid Profile: No results for input(s): CHOL, HDL, LDLCALC, TRIG, CHOLHDL, LDLDIRECT in the last 72 hours. Thyroid Function Tests: No results for input(s): TSH, T4TOTAL, FREET4, T3FREE, THYROIDAB in the last 72 hours. Anemia Panel: Recent Labs    04/20/20 1330  RETICCTPCT 2.6   Sepsis Labs: No results for input(s): PROCALCITON, LATICACIDVEN in the last 168 hours.  Recent Results (from the past 240 hour(s))  Resp Panel by RT-PCR (Flu A&B, Covid) Nasopharyngeal Swab     Status: None   Collection Time: 04/19/20  2:55 AM   Specimen: Nasopharyngeal Swab; Nasopharyngeal(NP) swabs in vial transport medium  Result Value Ref Range Status   SARS Coronavirus 2  by RT PCR NEGATIVE NEGATIVE Final    Comment: (NOTE) SARS-CoV-2 target nucleic acids are NOT DETECTED.  The SARS-CoV-2 RNA is generally detectable in upper respiratory specimens during the acute phase of infection. The lowest concentration of SARS-CoV-2 viral copies this assay can detect is 138 copies/mL. A negative result does not preclude SARS-Cov-2 infection and should not be used as the sole basis for treatment or other patient management decisions. A negative result may occur with  improper specimen collection/handling, submission of specimen other than nasopharyngeal swab, presence of viral mutation(s) within the areas targeted by this assay, and inadequate number of viral copies(<138 copies/mL). A negative result must be combined with clinical observations, patient history, and epidemiological information. The expected result is Negative.  Fact Sheet for Patients:  BloggerCourse.com  Fact Sheet for Healthcare Providers:  SeriousBroker.it  This test is no t yet approved or cleared by the [place] FDA and  has been authorized for detection and/or diagnosis of SARS-CoV-2 by FDA under an Emergency Use Authorization (EUA). This EUA will remain  in effect (meaning this test can be used) for the duration of the COVID-19 declaration under Section 564(b)(1) of the Act, 21 U.S.C.section 360bbb-3(b)(1), unless the authorization is terminated  or revoked sooner.       Influenza A by PCR NEGATIVE NEGATIVE Final   Influenza B by PCR NEGATIVE NEGATIVE Final    Comment: (NOTE) The Xpert Xpress SARS-CoV-2/FLU/RSV plus assay is intended as an aid in the diagnosis of influenza from Nasopharyngeal swab specimens and should not be used as a sole basis for treatment. Nasal washings and aspirates are unacceptable for Xpert Xpress SARS-CoV-2/FLU/RSV testing.  Fact Sheet for Patients: BloggerCourse.com  Fact  Sheet for Healthcare Providers: SeriousBroker.it  This test is not yet approved or cleared by the [place] FDA and has been authorized for detection and/or diagnosis of SARS-CoV-2 by FDA under an Emergency Use Authorization (EUA). This EUA will remain in effect (meaning this test can be used) for the duration of the COVID-19 declaration under Section 564(b)(1) of the Act, 21 U.S.C. section 360bbb-3(b)(1), unless the authorization is terminated or revoked.  Performed at [facility], [address] [place], [place] [address]   MRSA PCR Screening     Status: None   Collection Time: 04/19/20  4:05 PM   Specimen:  Nasal Mucosa; Nasopharyngeal  Result Value Ref Range Status   MRSA by PCR NEGATIVE NEGATIVE Final    Comment:        The GeneXpert MRSA Assay (FDA approved for NASAL specimens only), is one component of a comprehensive MRSA colonization surveillance program. It is not intended to diagnose MRSA infection nor to guide or monitor treatment for MRSA infections. Performed at [facility], [address] [place], [place] [address]          Radiology Studies: CT ANGIO CHEST PE W OR WO CONTRAST  Result Date: 04/19/2020 CLINICAL DATA:  Shortness of breath beginning yesterday with nonproductive cough. Hypoxia. Evaluate for pulmonary embolism. EXAM: CT ANGIOGRAPHY CHEST WITH CONTRAST TECHNIQUE: Multidetector CT imaging of the chest was performed using the standard protocol during bolus administration of intravenous contrast. Multiplanar CT image reconstructions and MIPs were obtained to evaluate the vascular anatomy. CONTRAST:  75mL OMNIPAQUE IOHEXOL 350 MG/ML SOLN COMPARISON:  None. FINDINGS: Cardiovascular: Borderline cardiomegaly. Moderate calcified plaque over the left main and 3 vessel coronary arteries. Thoracic aorta is normal in caliber with minimal calcified plaque present. Pulmonary arterial system is well opacified and demonstrates no  evidence of emboli. Mediastinum/Nodes: There is no significant mediastinal or hilar adenopathy. Remaining mediastinal structures are normal. Lungs/Pleura: Lungs are adequately inflated with minimal centrilobular emphysematous disease present. Examination demonstrates patchy bilateral mixed interstitial airspace opacification with nodularity worse over the posterior right upper lobe and right lower lobe likely due to atypical infection or inflammatory disease. Small bilateral pleural effusions with associated bibasilar atelectasis. Airways are normal. Upper Abdomen: Calcified plaque over the abdominal aorta. Mild patchy low-attenuation over the left renal cortex which may be partly due to streak artifact that is present. Musculoskeletal: No focal abnormality. Review of the MIP images confirms the above findings. IMPRESSION: 1. No evidence of pulmonary embolism. 2. Patchy bilateral mixed interstitial airspace process with nodularity worse over the posterior right upper lobe and right lower lobe likely due to an atypical infectious or inflammatory process. Small bilateral pleural effusions with associated bibasilar atelectasis. 3. Emphysema and aortic atherosclerosis. Atherosclerotic coronary artery disease. 4. Mild patchy low-attenuation over the left renal cortex which is likely due to streak artifact and less likely pyelonephritis. Recommend clinical correlation. Aortic Atherosclerosis (ICD10-I70.0) and Emphysema (ICD10-J43.9). Electronically Signed   By: Elberta Fortis M.D.   On: 04/19/2020 15:07   US Venous Img Lower Bilateral (DVT)  Addendum Date: 04/19/2020   ADDENDUM REPORT: 04/19/2020 11:03 ADDENDUM: Study discussed by telephone with Dr. Maurilio Lovely on 04/19/2020 at 1059 hours. Electronically Signed   By: Odessa Fleming M.D.   On: 04/19/2020 11:03   Result Date: 04/19/2020 CLINICAL DATA:  64 year old female with lower extremity swelling. EXAM: BILATERAL LOWER EXTREMITY VENOUS DOPPLER ULTRASOUND TECHNIQUE:  Gray-scale sonography with graded compression, as well as color Doppler and duplex ultrasound were performed to evaluate the lower extremity deep venous systems from the level of the common femoral vein and including the common femoral, femoral, profunda femoral, popliteal and calf veins including the posterior tibial, peroneal and gastrocnemius veins when visible. The superficial great saphenous vein was also interrogated. Spectral Doppler was utilized to evaluate flow at rest and with distal augmentation maneuvers in the common femoral, femoral and popliteal veins. COMPARISON:  Portable chest x-ray 04/19/2020. FINDINGS: RIGHT LOWER EXTREMITY Common Femoral Vein: No evidence of thrombus. Normal compressibility, respiratory phasicity and response to augmentation. Saphenofemoral Junction: No evidence of thrombus. Normal compressibility and flow on color Doppler imaging. Profunda Femoral Vein: No  evidence of thrombus. Normal compressibility and flow on color Doppler imaging. Femoral Vein: No evidence of thrombus. Normal compressibility, respiratory phasicity and response to augmentation. Popliteal Vein: No evidence of thrombus. Normal compressibility, respiratory phasicity and response to augmentation. Calf Veins: No evidence of thrombus. Normal compressibility and flow on color Doppler imaging. Superficial Great Saphenous Vein: No evidence of thrombus. Normal compressibility. Other Findings:  None. LEFT LOWER EXTREMITY Common Femoral Vein: Incomplete compressibility of the left common femoral vein at the saphenous vein junction with echogenic thrombus visible within the lumen (image 45), but the vessel remains patent (image 48). Saphenofemoral Junction: Patent saphenous vein junction (image 53) with normal response to augmentation. Profunda Femoral Vein: No evidence of thrombus. Normal compressibility and flow on color Doppler imaging. Femoral Vein: No evidence of thrombus. Normal compressibility, respiratory  phasicity and response to augmentation. Popliteal Vein: No evidence of thrombus. Normal compressibility, respiratory phasicity and response to augmentation. Calf Veins: No evidence of thrombus. Normal compressibility and flow on color Doppler imaging. Superficial Great Saphenous Vein: No evidence of thrombus. Normal compressibility. Other Findings:  Positive subcutaneous edema in the calf (image 83). IMPRESSION: 1. Positive for nonocclusive deep venous thrombosis at the left common femoral vein. This might be chronic thrombus, uncertain. 2. The remainder of the left lower extremity deep venous system is normally patent. 3. Negative for right lower extremity DVT. Electronically Signed: By: Odessa Fleming M.D. On: 04/19/2020 10:48   DG Chest Portable 1 View  Result Date: 04/19/2020 CLINICAL DATA:  Dyspnea, nonproductive cough EXAM: PORTABLE CHEST 1 VIEW COMPARISON:  None. FINDINGS: The lungs are symmetrically hyperinflated in keeping with changes of underlying COPD. There is more focal infiltrate noted at the right lung base, possibly infectious or inflammatory in the acute setting. No pneumothorax or pleural effusion. Cardiac size within normal limits. Pulmonary vascularity is normal. IMPRESSION: COPD. Right basilar focal pulmonary infiltrate, likely infectious in the appropriate clinical setting. Electronically Signed   By: Helyn Numbers MD   On: 04/19/2020 04:06   ECHOCARDIOGRAM COMPLETE  Result Date: 04/19/2020    ECHOCARDIOGRAM REPORT   Patient Name:   Sarah Bonilla Date of Exam: 04/19/2020 Medical Rec #:  [id number]        Height:       62.0 in Accession #:    [id number]       Weight:       98.0 lb Date of Birth:  07-04-1955        BSA:          1.412 m Patient Age:    64 years         BP:           196/113 mmHg Patient Gender: F                HR:           102 bpm. [facility] Procedure: 2D Echo, Cardiac Doppler and Color Doppler Indications:    CHF  History:        Patient has no prior  history of Echocardiogram examinations.                 Signs/Symptoms:Shortness of Breath; Risk Factors:Hypertension                 and Current Smoker. Elevated troponin.  Sonographer:    Lavenia Atlas RDCS Referring Phys: [id number] OLADAPO ADEFESO IMPRESSIONS  1. Left ventricular ejection fraction, by estimation, is 55 to 60%. The  left ventricle has normal function. The left ventricle has no regional wall motion abnormalities. There is moderate left ventricular hypertrophy. Left ventricular diastolic parameters are consistent with Grade III diastolic dysfunction (restrictive). Elevated left atrial pressure.  2. Right ventricular systolic function is normal. The right ventricular size is mildly enlarged. There is severely elevated pulmonary artery systolic pressure.  3. Left atrial size was severely dilated.  4. Right atrial size was severely dilated.  5. The mitral valve is normal in structure. Mild mitral valve regurgitation. No evidence of mitral stenosis.  6. The aortic valve is tricuspid. Aortic valve regurgitation is not visualized. No aortic stenosis is present.  7. The inferior vena cava is dilated in size with <50% respiratory variability, suggesting right atrial pressure of 15 mmHg. FINDINGS  Left Ventricle: Left ventricular ejection fraction, by estimation, is 55 to 60%. The left ventricle has normal function. The left ventricle has no regional wall motion abnormalities. The left ventricular internal cavity size was normal in size. There is  moderate left ventricular hypertrophy. Left ventricular diastolic parameters are consistent with Grade III diastolic dysfunction (restrictive). Elevated left atrial pressure. Right Ventricle: The right ventricular size is mildly enlarged. No increase in right ventricular wall thickness. Right ventricular systolic function is normal. There is severely elevated pulmonary artery systolic pressure. The tricuspid regurgitant velocity is 3.54 m/s, and with an assumed  right atrial pressure of 10 mmHg, the estimated right ventricular systolic pressure is 60.1 mmHg. Left Atrium: Left atrial size was severely dilated. Right Atrium: Right atrial size was severely dilated. Pericardium: There is no evidence of pericardial effusion. Mitral Valve: The mitral valve is normal in structure. Mild mitral valve regurgitation. No evidence of mitral valve stenosis. Tricuspid Valve: The tricuspid valve is normal in structure. Tricuspid valve regurgitation is not demonstrated. No evidence of tricuspid stenosis. Aortic Valve: The aortic valve is tricuspid. Aortic valve regurgitation is not visualized. No aortic stenosis is present. Aortic valve mean gradient measures 2.4 mmHg. Aortic valve peak gradient measures 4.8 mmHg. Aortic valve area, by VTI measures 1.97 cm. Pulmonic Valve: The pulmonic valve was not well visualized. Pulmonic valve regurgitation is not visualized. No evidence of pulmonic stenosis. Aorta: The aortic root is normal in size and structure. Pulmonary Artery: Indeterminant PASP, inadequate TR jet. Venous: The inferior vena cava is dilated in size with less than 50% respiratory variability, suggesting right atrial pressure of 15 mmHg. IAS/Shunts: No atrial level shunt detected by color flow Doppler.  LEFT VENTRICLE PLAX 2D LVIDd:         4.23 cm  Diastology LVIDs:         2.50 cm  LV e' medial:    4.24 cm/s LV PW:         1.20 cm  LV E/e' medial:  24.8 LV IVS:        1.21 cm  LV e' lateral:   5.55 cm/s LVOT diam:     1.80 cm  LV E/e' lateral: 18.9 LV SV:         34 LV SV Index:   24 LVOT Area:     2.54 cm  RIGHT VENTRICLE RV Basal diam:  2.99 cm RV S prime:     10.10 cm/s TAPSE (M-mode): 1.8 cm LEFT ATRIUM             Index       RIGHT ATRIUM           Index LA diam:  3.20 cm 2.27 cm/m  RA Area:     11.60 cm LA Vol (A2C):   23.3 ml 16.50 ml/m RA Volume:   24.30 ml  17.21 ml/m LA Vol (A4C):   31.4 ml 22.24 ml/m LA Biplane Vol: 29.3 ml 20.75 ml/m  AORTIC VALVE AV Area  (Vmax):    1.79 cm AV Area (Vmean):   1.75 cm AV Area (VTI):     1.97 cm AV Vmax:           109.56 cm/s AV Vmean:          72.315 cm/s AV VTI:            0.171 m AV Peak Grad:      4.8 mmHg AV Mean Grad:      2.4 mmHg LVOT Vmax:         76.90 cm/s LVOT Vmean:        49.600 cm/s LVOT VTI:          0.132 m LVOT/AV VTI ratio: 0.77  AORTA Ao Root diam: 2.60 cm MITRAL VALVE                TRICUSPID VALVE MV Area (PHT): 6.43 cm     TR Peak grad:   50.1 mmHg MV Decel Time: 118 msec     TR Vmax:        354.00 cm/s MV E velocity: 105.00 cm/s MV A velocity: 50.90 cm/s   SHUNTS MV E/A ratio:  2.06         Systemic VTI:  0.13 m                             Systemic Diam: 1.80 cm Dina RichJonathan Branch MD Electronically signed by Dina RichJonathan Branch MD Signature Date/Time: 04/19/2020/11:11:52 AM    Final         Scheduled Meds: . Chlorhexidine Gluconate Cloth  6 each Topical Daily  . dextromethorphan-guaiFENesin  1 tablet Oral BID  . feeding supplement  237 mL Oral BID BM  . lisinopril  10 mg Oral Daily  . metoprolol tartrate  25 mg Oral BID  . pantoprazole (PROTONIX) IV  40 mg Intravenous Daily   Continuous Infusions: . nitroGLYCERIN 30 mcg/min (04/20/20 1041)     LOS: 1 day    Time spent: 35 minutes    Valleri Hendricksen Hoover Brunette Shulamis Wenberg, DO [facility]  If 7PM-7AM, please contact night-coverage [email or web address] 04/20/2020, 3:18 PM

## 2020-04-21 LAB — CBC
HCT: 25.4 % — ABNORMAL LOW (ref 36.0–46.0)
Hemoglobin: 8 g/dL — ABNORMAL LOW (ref 12.0–15.0)
MCH: 29.5 pg (ref 26.0–34.0)
MCHC: 31.5 g/dL (ref 30.0–36.0)
MCV: 93.7 fL (ref 80.0–100.0)
Platelets: 150 10*3/uL (ref 150–400)
RBC: 2.71 MIL/uL — ABNORMAL LOW (ref 3.87–5.11)
RDW: 14.5 % (ref 11.5–15.5)
WBC: 10.6 10*3/uL — ABNORMAL HIGH (ref 4.0–10.5)
nRBC: 0 % (ref 0.0–0.2)

## 2020-04-21 LAB — BASIC METABOLIC PANEL
Anion gap: 10 (ref 5–15)
Anion gap: 7 (ref 5–15)
BUN: 25 mg/dL — ABNORMAL HIGH (ref 8–23)
BUN: 25 mg/dL — ABNORMAL HIGH (ref 8–23)
CO2: 30 mmol/L (ref 22–32)
CO2: 28 mmol/L (ref 22–32)
Calcium: 8.9 mg/dL (ref 8.9–10.3)
Calcium: 8.6 mg/dL — ABNORMAL LOW (ref 8.9–10.3)
Chloride: 101 mmol/L (ref 98–111)
Chloride: 104 mmol/L (ref 98–111)
Creatinine, Ser: 0.63 mg/dL (ref 0.44–1.00)
Creatinine, Ser: 0.65 mg/dL (ref 0.44–1.00)
GFR, Estimated: >60 mL/min (ref >60)
GFR, Estimated: >60 mL/min (ref >60)
Glucose, Bld: 102 mg/dL — ABNORMAL HIGH (ref 70–99)
Glucose, Bld: 113 mg/dL — ABNORMAL HIGH (ref 70–99)
Potassium: 2.7 mmol/L — CL (ref 3.5–5.1)
Potassium: 3.9 mmol/L (ref 3.5–5.1)
Sodium: 141 mmol/L (ref 135–145)
Sodium: 139 mmol/L (ref 135–145)

## 2020-04-21 LAB — MAGNESIUM: Magnesium: 1.9 mg/dL (ref 1.7–2.4)

## 2020-04-21 MED ORDER — LISINOPRIL 10 MG PO TABS
40.0000 mg | ORAL_TABLET | Freq: Every day | ORAL | Status: DC
  Administered 2020-04-21 – 2020-04-24 (×4): 40 mg via ORAL
  Filled 2020-04-21 (×4): qty 4

## 2020-04-21 MED ORDER — AMLODIPINE BESYLATE 5 MG PO TABS
5.0000 mg | ORAL_TABLET | Freq: Every day | ORAL | Status: DC
  Administered 2020-04-21: 5 mg via ORAL
  Filled 2020-04-21: qty 1

## 2020-04-21 MED ORDER — POTASSIUM CHLORIDE 10 MEQ/100ML IV SOLN
10.0000 meq | INTRAVENOUS | Status: AC
  Administered 2020-04-21 (×4): 10 meq via INTRAVENOUS
  Filled 2020-04-21 (×4): qty 100

## 2020-04-21 MED ORDER — POTASSIUM CHLORIDE CRYS ER 20 MEQ PO TBCR
40.0000 meq | EXTENDED_RELEASE_TABLET | Freq: Once | ORAL | Status: AC
  Administered 2020-04-21: 40 meq via ORAL
  Filled 2020-04-21: qty 2

## 2020-04-21 NOTE — Progress Notes (Signed)
Notified Dr. Sherryll Burger of critical potassium of 2.7

## 2020-04-21 NOTE — Progress Notes (Addendum)
PROGRESS NOTE    Sarah Bonilla  OMV:[id number] DOB: 10-Jun-1955 DOA: 04/19/2020 PCP: Patient, No Pcp Per   Brief Narrative:  Per HPI: Sarah Bonilla a 64 y.o.femalewith medical history significant forhypertension (noncompliant with medication) and tobacco abuse who presents to the emergency department due to 3-4 day onset of shortness of breath and leg swelling(L >R). Shortness of breath worsens with lying flat in bed and improves on sitting, is associated with dry cough. Patient does not have a PCP and has not been to any doctor for medical checkup in several years. She endorsed 45-pack-year smoking history. Patient denies chest pain, fever, chills, nausea, vomiting or abdominal pain.  -Patient has been admitted with acute hypoxemic respiratory failure likely in the setting of acute diastolic CHF exacerbation as noted on 2D echocardiogram performed 11/19.  She is noted to have a chronic left lower extremity DVT and PE study has returned negative.  She was being treated for potential COPD exacerbation on admission as well, but has no further bronchospasms and has been weaned off BiPAP after Lasix was given yesterday with -1.5 L negative balance over the last 24 hours.  She continues to have elevated blood pressure readings and heart rate and remains on nitroglycerin drip which needs to be further weaned.  Assessment & Plan:   Principal Problem:   Acute CHF (congestive heart failure) (HCC) Active Problems:   COPD (chronic obstructive pulmonary disease) (HCC)   Essential hypertension   Hypertensive emergency   Hypokalemia   Localized swelling of left lower extremity   Hypoalbuminemia   Elevated brain natriuretic peptide (BNP) level   Elevated troponin I level   Thrombocytopenia (HCC)   Tobacco abuse   Noncompliance with medication regimen   Acute hypoxemic respiratory failure secondary to acute diastolic CHF exacerbation -Currently resolved and patient is on room  air -Continue to wean nitroglycerin drip -No further need for Lasix noted as patient does not have any further volume overload -LVEF 55-60% with moderate LVH and grade 3 diastolic dysfunction noted 11/19  Hypertensive crisis-ongoing -Secondary to medication noncompliance and poor follow-up -Continue to wean off nitroglycerin drip with goal blood pressure 180/100 -Labetalol ordered as needed -Continue metoprolol 25 mg twice daily -Lisinopril increased to 40 mg this a.m., but patient still continues to have significantly elevated pressures and not weaning off nitroglycerin drip -Start amlodipine 5 mg this afternoon  Hypokalemia -Repleted -Recheck in a.m.  Elevated troponin secondary to above -Appears to be demand related and was evaluated by cardiology -2D echocardiogram without wall motion abnormalities  Anemia-stable -No overt bleeding noted -Anemia panel with no significant findings -Avoid heparin agents  Chronic left lower extremity DVT -No need for anticoagulation at this time -Recommend follow-up outpatient hematology for this as well as anemia as noted above  History of COPD with tobacco use -Breathing treatments as needed for bronchospasms -No acute exacerbation noted, will discontinue scheduled treatments as well as steroids which are likely exacerbating the tachycardia as well as blood pressure -Patient will need PFTs in outpatient setting with pulmonology follow-up -Counseled on cessation  Thrombocytopenia-improved -Avoid heparin agents and follow-up with hematology outpatient   DVT prophylaxis:SCDs Code Status: Full Family Communication: None at bedside Disposition Plan:  Status is: Inpatient  Remains inpatient appropriate because:IV treatments appropriate due to intensity of illness or inability to take PO and Inpatient level of care appropriate due to severity of illness   Dispo: The patient is from: Home  Anticipated d/c is to:  Home  Anticipated d/c  date is: 1 day  Patient currently is not medically stable to d/c.  Consultants:   Cardiology  Procedures:   See below  Antimicrobials:  Anti-infectives (From admission, onward)   Start     Dose/Rate Route Frequency Ordered Stop   04/20/20 1000  azithromycin (ZITHROMAX) tablet 250 mg  Status:  Discontinued       "Followed by" Linked Group Details   250 mg Oral Daily 04/19/20 0639 04/20/20 1517   04/19/20 0800  azithromycin (ZITHROMAX) tablet 500 mg       "Followed by" Linked Group Details   500 mg Oral Daily 04/19/20 0639 04/19/20 0854       Subjective: Patient seen and evaluated today with no new acute complaints or concerns. No acute concerns or events noted overnight.  She denies any chest pain or shortness of breath or headaches.  Objective: Vitals:   04/21/20 0900 04/21/20 1000 04/21/20 1100 04/21/20 1130  BP: (!) 164/100 (!) 172/84 (!) 183/88   Pulse: 83 87 85 62  Resp: 17 18 (!) 21 (!) 23  Temp:    98.3 F (36.8 C)  TempSrc:    Oral  SpO2: 96% 96% 96% 100%  Weight:      Height:        Intake/Output Summary (Last 24 hours) at 04/21/2020 1515 Last data filed at 04/21/2020 1146 Gross per 24 hour  Intake 595.63 ml  Output --  Net 595.63 ml   Filed Weights   04/19/20 0233 04/20/20 0500 04/21/20 0500  Weight: 44.5 kg 43.6 kg 42.4 kg    Examination:  General exam: Appears calm and comfortable  Respiratory system: Clear to auscultation. Respiratory effort normal. Cardiovascular system: S1 & S2 heard, RRR.  Gastrointestinal system: Abdomen is nondistended, soft and nontender.  Central nervous system: Alert and awake Extremities: No edema Skin: No rashes, lesions or ulcers Psychiatry: Judgement and insight appear normal. Mood & affect appropriate.     Data Reviewed: I have personally reviewed following labs and imaging studies  CBC: Recent Labs  Lab 04/19/20 0255 04/20/20 0502 04/20/20 1330  04/21/20 0457  WBC 9.4 7.2  --  10.6*  NEUTROABS 7.0  --   --   --   HGB 8.8* 7.5* 8.6* 8.0*  HCT 27.1* 23.6* 26.2* 25.4*  MCV 91.9 91.1  --  93.7  PLT 146* 140*  --  150   Basic Metabolic Panel: Recent Labs  Lab 04/19/20 0255 04/19/20 0255 04/19/20 0447 04/19/20 1048 04/20/20 0502 04/21/20 0457 04/21/20 1348  NA 141  --   --   --  140 141 139  K 2.5*   < > 2.3* 3.4* 3.5 2.7* 3.9  CL 103  --   --   --  103 101 104  CO2 27  --   --   --  29 30 28   GLUCOSE 109*  --   --   --  144* 102* 113*  BUN 19  --   --   --  22 25* 25*  CREATININE 0.67  --   --   --  0.56 0.63 0.65  CALCIUM 8.8*  --   --   --  8.9 8.9 8.6*  MG 1.7  --   --   --  2.0 1.9  --   PHOS  --   --   --   --  3.5  --   --    < > = values in this interval not displayed.   GFR:  Estimated Creatinine Clearance: 48.2 mL/min (by C-G formula based on SCr of 0.65 mg/dL). Liver Function Tests: Recent Labs  Lab 04/19/20 0255 04/20/20 0502  AST 31 26  ALT 30 32  ALKPHOS 87 72  BILITOT 0.8 0.8  PROT 7.2 6.0*  ALBUMIN 3.3* 2.9*   No results for input(s): LIPASE, AMYLASE in the last 168 hours. No results for input(s): AMMONIA in the last 168 hours. Coagulation Profile: Recent Labs  Lab 04/20/20 0502  INR 1.2   Cardiac Enzymes: No results for input(s): CKTOTAL, CKMB, CKMBINDEX, TROPONINI in the last 168 hours. BNP (last 3 results) No results for input(s): PROBNP in the last 8760 hours. HbA1C: No results for input(s): HGBA1C in the last 72 hours. CBG: No results for input(s): GLUCAP in the last 168 hours. Lipid Profile: No results for input(s): CHOL, HDL, LDLCALC, TRIG, CHOLHDL, LDLDIRECT in the last 72 hours. Thyroid Function Tests: No results for input(s): TSH, T4TOTAL, FREET4, T3FREE, THYROIDAB in the last 72 hours. Anemia Panel: Recent Labs    04/20/20 1330  VITAMINB12 194  FOLATE 10.1  FERRITIN 152  TIBC 258  IRON 43  RETICCTPCT 2.6   Sepsis Labs: Recent Labs  Lab 04/20/20 1330   PROCALCITON <0.10    Recent Results (from the past 240 hour(s))  Resp Panel by RT-PCR (Flu A&B, Covid) Nasopharyngeal Swab     Status: None   Collection Time: 04/19/20  2:55 AM   Specimen: Nasopharyngeal Swab; Nasopharyngeal(NP) swabs in vial transport medium  Result Value Ref Range Status   SARS Coronavirus 2 by RT PCR NEGATIVE NEGATIVE Final    Comment: (NOTE) SARS-CoV-2 target nucleic acids are NOT DETECTED.  The SARS-CoV-2 RNA is generally detectable in upper respiratory specimens during the acute phase of infection. The lowest concentration of SARS-CoV-2 viral copies this assay can detect is 138 copies/mL. A negative result does not preclude SARS-Cov-2 infection and should not be used as the sole basis for treatment or other patient management decisions. A negative result may occur with  improper specimen collection/handling, submission of specimen other than nasopharyngeal swab, presence of viral mutation(s) within the areas targeted by this assay, and inadequate number of viral copies(<138 copies/mL). A negative result must be combined with clinical observations, patient history, and epidemiological information. The expected result is Negative.  Fact Sheet for Patients:  BloggerCourse.com  Fact Sheet for Healthcare Providers:  SeriousBroker.it  This test is no t yet approved or cleared by the [place] FDA and  has been authorized for detection and/or diagnosis of SARS-CoV-2 by FDA under an Emergency Use Authorization (EUA). This EUA will remain  in effect (meaning this test can be used) for the duration of the COVID-19 declaration under Section 564(b)(1) of the Act, 21 U.S.C.section 360bbb-3(b)(1), unless the authorization is terminated  or revoked sooner.       Influenza A by PCR NEGATIVE NEGATIVE Final   Influenza B by PCR NEGATIVE NEGATIVE Final    Comment: (NOTE) The Xpert Xpress SARS-CoV-2/FLU/RSV plus  assay is intended as an aid in the diagnosis of influenza from Nasopharyngeal swab specimens and should not be used as a sole basis for treatment. Nasal washings and aspirates are unacceptable for Xpert Xpress SARS-CoV-2/FLU/RSV testing.  Fact Sheet for Patients: BloggerCourse.com  Fact Sheet for Healthcare Providers: SeriousBroker.it  This test is not yet approved or cleared by the [place] FDA and has been authorized for detection and/or diagnosis of SARS-CoV-2 by FDA under an Emergency Use Authorization (EUA). This EUA will remain  in effect (meaning this test can be used) for the duration of the COVID-19 declaration under Section 564(b)(1) of the Act, 21 U.S.C. section 360bbb-3(b)(1), unless the authorization is terminated or revoked.  Performed at [facility], [address] [place], [place] [address]   MRSA PCR Screening     Status: None   Collection Time: 04/19/20  4:05 PM   Specimen: Nasal Mucosa; Nasopharyngeal  Result Value Ref Range Status   MRSA by PCR NEGATIVE NEGATIVE Final    Comment:        The GeneXpert MRSA Assay (FDA approved for NASAL specimens only), is one component of a comprehensive MRSA colonization surveillance program. It is not intended to diagnose MRSA infection nor to guide or monitor treatment for MRSA infections. Performed at [facility], [address] [place], [place] [address]          Radiology Studies: No results found.      Scheduled Meds: . amLODipine  5 mg Oral Daily  . Chlorhexidine Gluconate Cloth  6 each Topical Daily  . dextromethorphan-guaiFENesin  1 tablet Oral BID  . feeding supplement  237 mL Oral BID BM  . lisinopril  40 mg Oral Daily  . metoprolol tartrate  25 mg Oral BID  . pantoprazole (PROTONIX) IV  40 mg Intravenous Daily   Continuous Infusions: . nitroGLYCERIN 45 mcg/min (04/21/20 1000)     LOS: 2 days    Time spent: 30 minutes    Jaykob Minichiello  Hoover Brunette, DO [facility]  If 7PM-7AM, please contact night-coverage [email or web address] 04/21/2020, 3:15 PM

## 2020-04-22 LAB — CBC
HCT: 26.5 % — ABNORMAL LOW (ref 36.0–46.0)
Hemoglobin: 8.4 g/dL — ABNORMAL LOW (ref 12.0–15.0)
MCH: 29.4 pg (ref 26.0–34.0)
MCHC: 31.7 g/dL (ref 30.0–36.0)
MCV: 92.7 fL (ref 80.0–100.0)
Platelets: 165 10*3/uL (ref 150–400)
RBC: 2.86 MIL/uL — ABNORMAL LOW (ref 3.87–5.11)
RDW: 14.2 % (ref 11.5–15.5)
WBC: 9.3 10*3/uL (ref 4.0–10.5)
nRBC: 0 % (ref 0.0–0.2)

## 2020-04-22 LAB — MAGNESIUM: Magnesium: 1.9 mg/dL (ref 1.7–2.4)

## 2020-04-22 LAB — BASIC METABOLIC PANEL
Anion gap: 10 (ref 5–15)
BUN: 24 mg/dL — ABNORMAL HIGH (ref 8–23)
CO2: 26 mmol/L (ref 22–32)
Calcium: 8.9 mg/dL (ref 8.9–10.3)
Chloride: 104 mmol/L (ref 98–111)
Creatinine, Ser: 0.7 mg/dL (ref 0.44–1.00)
GFR, Estimated: >60 mL/min (ref >60)
Glucose, Bld: 92 mg/dL (ref 70–99)
Potassium: 3.6 mmol/L (ref 3.5–5.1)
Sodium: 140 mmol/L (ref 135–145)

## 2020-04-22 MED ORDER — AMLODIPINE BESYLATE 5 MG PO TABS
10.0000 mg | ORAL_TABLET | Freq: Every day | ORAL | Status: DC
  Administered 2020-04-22 – 2020-04-24 (×3): 10 mg via ORAL
  Filled 2020-04-22 (×3): qty 2

## 2020-04-22 MED ORDER — HYDRALAZINE HCL 25 MG PO TABS
25.0000 mg | ORAL_TABLET | Freq: Three times a day (TID) | ORAL | Status: DC
  Administered 2020-04-22 – 2020-04-23 (×4): 25 mg via ORAL
  Filled 2020-04-22 (×4): qty 1

## 2020-04-22 NOTE — Progress Notes (Addendum)
Progress Note  Patient Name: Sarah Bonilla Date of Encounter: 04/22/2020  Primary Cardiologist: Dina Rich, MD   Subjective   Breathing and lower extremity edema improved. No chest pain or palpitations. Anxious to go home.   Inpatient Medications    Scheduled Meds: . amLODipine  10 mg Oral Daily  . Chlorhexidine Gluconate Cloth  6 each Topical Daily  . dextromethorphan-guaiFENesin  1 tablet Oral BID  . feeding supplement  237 mL Oral BID BM  . hydrALAZINE  25 mg Oral Q8H  . lisinopril  40 mg Oral Daily  . metoprolol tartrate  25 mg Oral BID  . pantoprazole (PROTONIX) IV  40 mg Intravenous Daily   Continuous Infusions: . nitroGLYCERIN 15 mcg/min (04/22/20 1100)   PRN Meds: acetaminophen, iohexol, ipratropium-albuterol, labetalol   Vital Signs    Vitals:   04/22/20 0945 04/22/20 1000 04/22/20 1015 04/22/20 1030  BP: (!) 198/60 (!) 171/84 (!) 174/83 (!) 155/69  Pulse: 90 88 85 80  Resp: 19 (!) 24 20 20   Temp:      TempSrc:      SpO2: 96% 96% 98% 95%  Weight:      Height:        Intake/Output Summary (Last 24 hours) at 04/22/2020 1130 Last data filed at 04/22/2020 1100 Gross per 24 hour  Intake 1202.67 ml  Output --  Net 1202.67 ml    Last 3 Weights 04/22/2020 04/21/2020 04/20/2020  Weight (lbs) 94 lb 5.7 oz 93 lb 7.6 oz 96 lb 1.9 oz  Weight (kg) 42.8 kg 42.4 kg 43.6 kg      Telemetry    NSR, HR in 70's to 80's. Brief episode of narrow-complex tachycardia, lasting less than 3 seconds - Personally Reviewed  ECG    No new tracings.   Physical Exam   General: Thin female appearing in no acute distress. Head: Normocephalic, atraumatic.  Neck: Supple without bruits, JVD not elevated. Lungs:  Resp regular and unlabored, CTA without wheezing or rales. Heart: RRR, S1, S2, no S3, S4, or murmur; no rub. Abdomen: Soft, non-tender, non-distended with normoactive bowel sounds. No hepatomegaly. No rebound/guarding. No obvious abdominal  masses. Extremities: No clubbing, cyanosis, or lower extremity edema. Distal pedal pulses are 2+ bilaterally. Neuro: Alert and oriented X 3. Moves all extremities spontaneously. Psych: Normal affect.  Labs    Chemistry Recent Labs  Lab 04/19/20 0255 04/19/20 0447 04/20/20 0502 04/20/20 0502 04/21/20 0457 04/21/20 1348 04/22/20 0430  NA 141   < > 140   < > 141 139 140  K 2.5*   < > 3.5   < > 2.7* 3.9 3.6  CL 103   < > 103   < > 101 104 104  CO2 27   < > 29   < > 30 28 26   GLUCOSE 109*   < > 144*   < > 102* 113* 92  BUN 19   < > 22   < > 25* 25* 24*  CREATININE 0.67   < > 0.56   < > 0.63 0.65 0.70  CALCIUM 8.8*   < > 8.9   < > 8.9 8.6* 8.9  PROT 7.2  --  6.0*  --   --   --   --   ALBUMIN 3.3*  --  2.9*  --   --   --   --   AST 31  --  26  --   --   --   --  ALT 30  --  32  --   --   --   --   ALKPHOS 87  --  72  --   --   --   --   BILITOT 0.8  --  0.8  --   --   --   --   GFRNONAA >60   < > >60   < > >60 >60 >60  ANIONGAP 11   < > 8   < > 10 7 10    < > = values in this interval not displayed.     Hematology Recent Labs  Lab 04/20/20 0502 04/20/20 0502 04/20/20 1330 04/21/20 0457 04/22/20 0430  WBC 7.2  --   --  10.6* 9.3  RBC 2.59*  --  2.89* 2.71* 2.86*  HGB 7.5*   < > 8.6* 8.0* 8.4*  HCT 23.6*   < > 26.2* 25.4* 26.5*  MCV 91.1  --   --  93.7 92.7  MCH 29.0  --   --  29.5 29.4  MCHC 31.8  --   --  31.5 31.7  RDW 14.2  --   --  14.5 14.2  PLT 140*  --   --  150 165   < > = values in this interval not displayed.    Cardiac EnzymesNo results for input(s): TROPONINI in the last 168 hours. No results for input(s): TROPIPOC in the last 168 hours.   BNP Recent Labs  Lab 04/19/20 0255  BNP 1,081.0*     DDimer No results for input(s): DDIMER in the last 168 hours.   Radiology    No results found.  Cardiac Studies   Echocardiogram: 04/19/2020 IMPRESSIONS    1. Left ventricular ejection fraction, by estimation, is 55 to 60%. The  left ventricle has  normal function. The left ventricle has no regional  wall motion abnormalities. There is moderate left ventricular hypertrophy.  Left ventricular diastolic  parameters are consistent with Grade III diastolic dysfunction  (restrictive). Elevated left atrial pressure.  2. Right ventricular systolic function is normal. The right ventricular  size is mildly enlarged. There is severely elevated pulmonary artery  systolic pressure.  3. Left atrial size was severely dilated.  4. Right atrial size was severely dilated.  5. The mitral valve is normal in structure. Mild mitral valve  regurgitation. No evidence of mitral stenosis.  6. The aortic valve is tricuspid. Aortic valve regurgitation is not  visualized. No aortic stenosis is present.  7. The inferior vena cava is dilated in size with <50% respiratory  variability, suggesting right atrial pressure of 15 mmHg.   Patient Profile     64 y.o. female w/ PMH of HTN (not on medical therapy for 10+ years) and tobacco use who presented for evaluation of worsening dyspnea. Found to have hypertensive emergency with SBP > 230 on admission.   Assessment & Plan    1. Acute Diastolic CHF Exacerbation - She presented with worsening dyspnea on exertion, orthopnea and edema. BNP elevated to 1081. She diuresed well with IV Lasix. I&O's not recorded but weight is down 4 lbs (98 lbs --> 94 lbs). She appears euvolemic by examination today. No indication for further IV Lasix at this time.   2. Elevated Troponin  - HS Troponin values peaked at 78 this admission and felt to be most consistent with demand ischemia in the setting of severely elevated BP and CHF exacerbation. Echo this admission shows a preserved EF with no regional WMA. No  plans for further ischemic testing at this time.   3. Hypertensive Emergency - SBP > 230 on admission. She has remained on IV NTG over the weekend but this has been weaned, now at 20 mcg/min. On Lopressor 25mg  BID and  Lisinopril 40mg  daily. Amlodipine was added on 11/21 and titrated to 10mg  this AM. Also started on Hydralazine 25mg  TID. Reviewed with the admitting team and agree with titration of Amlodipine and initiation of Hydralazine. Can further titrate Hydralazine tomorrow or consider transitioning Lopressor to Coreg for improved BP effect. Can hopefully stop IV NTG today.   4. COPD Exacerbation - CXR on admission was consistent with COPD. PASP severely elevated by echo this admission. Initially treated with antibiotics, steroids and scheduled nebulizer treatments. Outpatient PFT's have been recommended.   For questions or updates, please contact [facility] Please consult [email or web address] for contact info under Cardiology/STEMI.   Signed, , PA-C 11:30 AM 04/22/2020 Pager: [phone]  Pt seen and examined   I agree with findings and assessment as noted by B Strader above Pt is breathing much better   She has diuresed ON exam, BP is still elevated though not as bad Neck:  JVP is normal Lungs are relatively clear  Cardiac exam  RRR  No S3 or murmurs Ext are without edema  HTN  Agree with continued titration of meds   Will need close outpt f/u Diastolic CHF  Volume appears OK  Discussed limiting salt intake   Will continue to follow Pt is closer to d/c  MD

## 2020-04-22 NOTE — Progress Notes (Signed)
PROGRESS NOTE    Sarah Bonilla  XKG:[id number] DOB: 1956/03/17 DOA: 04/19/2020 PCP: Patient, No Pcp Per   Brief Narrative:  Per HPI: Sarah Bonilla a 64 y.o.femalewith medical history significant forhypertension (noncompliant with medication) and tobacco abuse who presents to the emergency department due to 3-4 day onset of shortness of breath and leg swelling(L >R). Shortness of breath worsens with lying flat in bed and improves on sitting, is associated with dry cough. Patient does not have a PCP and has not been to any doctor for medical checkup in several years. She endorsed 45-pack-year smoking history. Patient denies chest pain, fever, chills, nausea, vomiting or abdominal pain.  -Patient has been admitted with acute hypoxemic respiratory failure likely in the setting of acute diastolic CHF exacerbation as noted on 2D echocardiogram performed 11/19. She is noted to have a chronic left lower extremity DVT and PE study has returned negative. She was being treated for potential COPD exacerbation on admission as well, but has no further bronchospasms and has been weaned off BiPAP after Lasix was given yesterday with -1.5 L negative balance over the last 24 hours. She continues to have elevated blood pressure readings and heart rate and remains on nitroglycerin drip which needs to be further weaned.  Assessment & Plan:   Principal Problem:   Acute CHF (congestive heart failure) (HCC) Active Problems:   COPD (chronic obstructive pulmonary disease) (HCC)   Essential hypertension   Hypertensive emergency   Hypokalemia   Localized swelling of left lower extremity   Hypoalbuminemia   Elevated brain natriuretic peptide (BNP) level   Elevated troponin I level   Thrombocytopenia (HCC)   Tobacco abuse   Noncompliance with medication regimen   Acute hypoxemic respiratory failure secondary to acute diastolic CHF exacerbation-resolved -Currently resolved and patient is on  room air -Continue to wean nitroglycerin drip -No further need for Lasix noted as patient does not have any further volume overload -LVEF 55-60% with moderate LVH and grade 3 diastolic dysfunction noted 11/19  Hypertensive crisis-ongoing -Secondary to medication noncompliance and poor follow-up -Continue to wean off nitroglycerin drip with goal blood pressure 180/100 -Labetalol ordered as needed -Continue metoprolol 25 mg twice daily -Lisinopril increased to 40 mg this a.m., but patient still continues to have significantly elevated pressures and not weaning off nitroglycerin drip -Increased amlodipine to 10 mg on 11/22 -Added hydralazine 25 TID on 11/22  Elevated troponin secondary to above -Appears to be demand related and was evaluated by cardiology -2D echocardiogram without wall motion abnormalities  Anemia-stable -No overt bleeding noted -Anemia panel with no significant findings -Avoid heparin agents  Chronic left lower extremity DVT -No need for anticoagulation at this time -Recommend follow-up outpatient hematology for this as well as anemia as noted above  History of COPD with tobacco use -Breathing treatments as needed for bronchospasms -No acute exacerbation noted, will discontinue scheduled treatments as well as steroids which are likely exacerbating the tachycardia as well as blood pressure -Patient will need PFTs in outpatient setting with pulmonology follow-up -Counseled on cessation  Thrombocytopenia-improved -Avoid heparin agents and follow-up with hematology outpatient   DVT prophylaxis:SCDs Code Status:Full Family Communication:None at bedside Disposition Plan: Status is: Inpatient  Remains inpatient appropriate because:IV treatments appropriate due to intensity of illness or inability to take PO and Inpatient level of care appropriate due to severity of illness   Dispo: The patient is from:Home Anticipated d/c is  FW:YOVZ Anticipated d/c date is: 1 day Patient currently is not medically  stable to d/c.  Consultants:  Cardiology  Procedures:  See below  Antimicrobials:  Anti-infectives (From admission, onward)   Start     Dose/Rate Route Frequency Ordered Stop   04/20/20 1000  azithromycin (ZITHROMAX) tablet 250 mg  Status:  Discontinued       "Followed by" Linked Group Details   250 mg Oral Daily 04/19/20 0639 04/20/20 1517   04/19/20 0800  azithromycin (ZITHROMAX) tablet 500 mg       "Followed by" Linked Group Details   500 mg Oral Daily 04/19/20 0639 04/19/20 0854       Subjective: Patient seen and evaluated today with no new acute complaints or concerns. No acute concerns or events noted overnight.  Objective: Vitals:   04/22/20 0700 04/22/20 0745 04/22/20 0800 04/22/20 0839  BP: (!) 199/120 (!) 200/96 (!) 200/83 (!) 188/88  Pulse: 82 83 80   Resp: 17 (!) 26 17   Temp:   98.3 F (36.8 C)   TempSrc:   Oral   SpO2: 97% 94% 94%   Weight:      Height:        Intake/Output Summary (Last 24 hours) at 04/22/2020 0931 Last data filed at 04/22/2020 0700 Gross per 24 hour  Intake 1172.6 ml  Output --  Net 1172.6 ml   Filed Weights   04/20/20 0500 04/21/20 0500 04/22/20 0500  Weight: 43.6 kg 42.4 kg 42.8 kg    Examination:  General exam: Appears calm and comfortable  Respiratory system: Clear to auscultation. Respiratory effort normal. Cardiovascular system: S1 & S2 heard, RRR.  Gastrointestinal system: Abdomen is nondistended, soft and nontender.  Central nervous system: Alert and oriented. No focal neurological deficits. Extremities: No edema Skin: No rashes, lesions or ulcers Psychiatry: Judgement and insight appear normal. Mood & affect appropriate.     Data Reviewed: I have personally reviewed following labs and imaging studies  CBC: Recent Labs  Lab 04/19/20 0255 04/20/20 0502 04/20/20 1330 04/21/20 0457 04/22/20 0430  WBC  9.4 7.2  --  10.6* 9.3  NEUTROABS 7.0  --   --   --   --   HGB 8.8* 7.5* 8.6* 8.0* 8.4*  HCT 27.1* 23.6* 26.2* 25.4* 26.5*  MCV 91.9 91.1  --  93.7 92.7  PLT 146* 140*  --  150 165   Basic Metabolic Panel: Recent Labs  Lab 04/19/20 0255 04/19/20 0447 04/19/20 1048 04/20/20 0502 04/21/20 0457 04/21/20 1348 04/22/20 0430  NA 141  --   --  140 141 139 140  K 2.5*   < > 3.4* 3.5 2.7* 3.9 3.6  CL 103  --   --  103 101 104 104  CO2 27  --   --  29 30 28 26   GLUCOSE 109*  --   --  144* 102* 113* 92  BUN 19  --   --  22 25* 25* 24*  CREATININE 0.67  --   --  0.56 0.63 0.65 0.70  CALCIUM 8.8*  --   --  8.9 8.9 8.6* 8.9  MG 1.7  --   --  2.0 1.9  --  1.9  PHOS  --   --   --  3.5  --   --   --    < > = values in this interval not displayed.   GFR: Estimated Creatinine Clearance: 48.6 mL/min (by C-G formula based on SCr of 0.7 mg/dL). Liver Function Tests: Recent Labs  Lab 04/19/20 0255 04/20/20 0502  AST 31 26  ALT 30 32  ALKPHOS 87 72  BILITOT 0.8 0.8  PROT 7.2 6.0*  ALBUMIN 3.3* 2.9*   No results for input(s): LIPASE, AMYLASE in the last 168 hours. No results for input(s): AMMONIA in the last 168 hours. Coagulation Profile: Recent Labs  Lab 04/20/20 0502  INR 1.2   Cardiac Enzymes: No results for input(s): CKTOTAL, CKMB, CKMBINDEX, TROPONINI in the last 168 hours. BNP (last 3 results) No results for input(s): PROBNP in the last 8760 hours. HbA1C: No results for input(s): HGBA1C in the last 72 hours. CBG: No results for input(s): GLUCAP in the last 168 hours. Lipid Profile: No results for input(s): CHOL, HDL, LDLCALC, TRIG, CHOLHDL, LDLDIRECT in the last 72 hours. Thyroid Function Tests: No results for input(s): TSH, T4TOTAL, FREET4, T3FREE, THYROIDAB in the last 72 hours. Anemia Panel: Recent Labs    04/20/20 1330  VITAMINB12 194  FOLATE 10.1  FERRITIN 152  TIBC 258  IRON 43  RETICCTPCT 2.6   Sepsis Labs: Recent Labs  Lab 04/20/20 1330  PROCALCITON  <0.10    Recent Results (from the past 240 hour(s))  Resp Panel by RT-PCR (Flu A&B, Covid) Nasopharyngeal Swab     Status: None   Collection Time: 04/19/20  2:55 AM   Specimen: Nasopharyngeal Swab; Nasopharyngeal(NP) swabs in vial transport medium  Result Value Ref Range Status   SARS Coronavirus 2 by RT PCR NEGATIVE NEGATIVE Final    Comment: (NOTE) SARS-CoV-2 target nucleic acids are NOT DETECTED.  The SARS-CoV-2 RNA is generally detectable in upper respiratory specimens during the acute phase of infection. The lowest concentration of SARS-CoV-2 viral copies this assay can detect is 138 copies/mL. A negative result does not preclude SARS-Cov-2 infection and should not be used as the sole basis for treatment or other patient management decisions. A negative result may occur with  improper specimen collection/handling, submission of specimen other than nasopharyngeal swab, presence of viral mutation(s) within the areas targeted by this assay, and inadequate number of viral copies(<138 copies/mL). A negative result must be combined with clinical observations, patient history, and epidemiological information. The expected result is Negative.  Fact Sheet for Patients:  BloggerCourse.comhttps://www.fda.gov/media/152166/download  Fact Sheet for Healthcare Providers:  SeriousBroker.ithttps://www.fda.gov/media/152162/download  This test is no t yet approved or cleared by the [place] FDA and  has been authorized for detection and/or diagnosis of SARS-CoV-2 by FDA under an Emergency Use Authorization (EUA). This EUA will remain  in effect (meaning this test can be used) for the duration of the COVID-19 declaration under Section 564(b)(1) of the Act, 21 U.S.C.section 360bbb-3(b)(1), unless the authorization is terminated  or revoked sooner.       Influenza A by PCR NEGATIVE NEGATIVE Final   Influenza B by PCR NEGATIVE NEGATIVE Final    Comment: (NOTE) The Xpert Xpress SARS-CoV-2/FLU/RSV plus assay is intended  as an aid in the diagnosis of influenza from Nasopharyngeal swab specimens and should not be used as a sole basis for treatment. Nasal washings and aspirates are unacceptable for Xpert Xpress SARS-CoV-2/FLU/RSV testing.  Fact Sheet for Patients: BloggerCourse.comhttps://www.fda.gov/media/152166/download  Fact Sheet for Healthcare Providers: SeriousBroker.ithttps://www.fda.gov/media/152162/download  This test is not yet approved or cleared by the [place] FDA and has been authorized for detection and/or diagnosis of SARS-CoV-2 by FDA under an Emergency Use Authorization (EUA). This EUA will remain in effect (meaning this test can be used) for the duration of the COVID-19 declaration under Section 564(b)(1) of the Act, 21 U.S.C. section 360bbb-3(b)(1), unless  the authorization is terminated or revoked.  Performed at [facility], [address] [place], [place] [address]   MRSA PCR Screening     Status: None   Collection Time: 04/19/20  4:05 PM   Specimen: Nasal Mucosa; Nasopharyngeal  Result Value Ref Range Status   MRSA by PCR NEGATIVE NEGATIVE Final    Comment:        The GeneXpert MRSA Assay (FDA approved for NASAL specimens only), is one component of a comprehensive MRSA colonization surveillance program. It is not intended to diagnose MRSA infection nor to guide or monitor treatment for MRSA infections. Performed at [facility], [address], [place], [place] [address]          Radiology Studies: No results found.      Scheduled Meds: . amLODipine  10 mg Oral Daily  . Chlorhexidine Gluconate Cloth  6 each Topical Daily  . dextromethorphan-guaiFENesin  1 tablet Oral BID  . feeding supplement  237 mL Oral BID BM  . hydrALAZINE  25 mg Oral Q8H  . lisinopril  40 mg Oral Daily  . metoprolol tartrate  25 mg Oral BID  . pantoprazole (PROTONIX) IV  40 mg Intravenous Daily   Continuous Infusions: . nitroGLYCERIN 25 mcg/min (04/22/20 0844)     LOS: 3 days    Time spent: 30  minutes    Kieara Schwark Hoover Brunette, DO [facility]  If 7PM-7AM, please contact night-coverage [email or web address] 04/22/2020, 9:31 AM

## 2020-04-22 NOTE — TOC Initial Note (Signed)
Transition of Care [facility]) - Initial/Assessment Note   Patient Details  Name: Sarah Bonilla MRN: [id number] Date of Birth: 1956-02-17  [facility]) CM/SW Contact:    Ewing Schlein, LCSW Phone Number: 04/22/2020, 11:44 AM  Clinical Narrative: Patient is a 64 year old female who was admitted for acute CHF. Patient has history of COPD and HTN. Patient is not currently insured and does not have a PCP. CSW spoke with patient regarding referral to [facility] to assist with PCP services. Patient agreeable to referral and given [facility] handout. CSW made referral to [facility]. TOC to follow for discharge needs.  Expected Discharge Plan: Home/Self Care Barriers to Discharge: Inadequate or no insurance, Continued Medical Work up  Patient Goals and CMS Choice Patient states their goals for this hospitalization and ongoing recovery are:: Discharge home CMS Medicare.gov Compare Post Acute Care list provided to:: Patient Choice offered to / list presented to : NA  Expected Discharge Plan and Services Expected Discharge Plan: Home/Self Care In-house Referral: Clinical Social Work Discharge Planning Services: Other - See comment (Referral to [facility]) Post Acute Care Choice: NA Living arrangements for the past 2 months: Single Family Home             DME Arranged: N/A DME Agency: NA HH Arranged: NA HH Agency: NA  Prior Living Arrangements/Services Living arrangements for the past 2 months: Single Family Home Lives with:: Self Patient language and need for interpreter reviewed:: Yes Do you feel safe going back to the place where you live?: Yes      Need for Family Participation in Patient Care: No (Comment) Care giver support system in place?: Yes (comment) Criminal Activity/Legal Involvement Pertinent to Current Situation/Hospitalization: No - Comment as needed  Activities of Daily Living Home Assistive Devices/Equipment: Eyeglasses ADL Screening (condition at time  of admission) Patient's cognitive ability adequate to safely complete daily activities?: Yes Is the patient deaf or have difficulty hearing?: No Does the patient have difficulty seeing, even when wearing glasses/contacts?: No Does the patient have difficulty concentrating, remembering, or making decisions?: No Patient able to express need for assistance with ADLs?: Yes Does the patient have difficulty dressing or bathing?: No Independently performs ADLs?: Yes (appropriate for developmental age) Does the patient have difficulty walking or climbing stairs?: No Weakness of Legs: None Weakness of Arms/Hands: None  Permission Sought/Granted Permission sought to share information with : Other (comment) Permission granted to share information with : Yes, Verbal Permission Granted Permission granted to share info w AGENCY: [facility]  Emotional Assessment Appearance:: Appears stated age Attitude/Demeanor/Rapport: Engaged Affect (typically observed): Accepting Orientation: : Oriented to Self, Oriented to Place, Oriented to  Time, Oriented to Situation Alcohol / Substance Use: Tobacco Use Psych Involvement: No (comment)  Admission diagnosis:  Hypokalemia [E87.6] Acute pulmonary edema (HCC) [J81.0] Leg swelling [M79.89] Hypoxia [R09.02] Acute CHF (congestive heart failure) (HCC) [I50.9] Hypertension, unspecified type [I10] Patient Active Problem List   Diagnosis Date Noted  . Acute CHF (congestive heart failure) (HCC) 04/19/2020  . COPD (chronic obstructive pulmonary disease) (HCC) 04/19/2020  . Essential hypertension 04/19/2020  . Hypertensive emergency 04/19/2020  . Hypokalemia 04/19/2020  . Localized swelling of left lower extremity 04/19/2020  . Hypoalbuminemia 04/19/2020  . Elevated brain natriuretic peptide (BNP) level 04/19/2020  . Elevated troponin I level 04/19/2020  . Thrombocytopenia (HCC) 04/19/2020  . Tobacco abuse 04/19/2020  . Noncompliance with medication regimen  04/19/2020   PCP:  Patient, No Pcp Per Pharmacy:  [id number] - [place], [place] - [address] [place] [address] Phone: [phone] Fax: [phone]  Readmission Risk Interventions No flowsheet data found.

## 2020-04-23 DIAGNOSIS — I5041 Acute combined systolic (congestive) and diastolic (congestive) heart failure: Secondary | ICD-10-CM

## 2020-04-23 LAB — MAGNESIUM: Magnesium: 1.8 mg/dL (ref 1.7–2.4)

## 2020-04-23 LAB — BASIC METABOLIC PANEL
Anion gap: 9 (ref 5–15)
BUN: 18 mg/dL (ref 8–23)
CO2: 26 mmol/L (ref 22–32)
Calcium: 8.6 mg/dL — ABNORMAL LOW (ref 8.9–10.3)
Chloride: 104 mmol/L (ref 98–111)
Creatinine, Ser: 0.6 mg/dL (ref 0.44–1.00)
GFR, Estimated: >60 mL/min (ref >60)
Glucose, Bld: 90 mg/dL (ref 70–99)
Potassium: 3.2 mmol/L — ABNORMAL LOW (ref 3.5–5.1)
Sodium: 139 mmol/L (ref 135–145)

## 2020-04-23 MED ORDER — POTASSIUM CHLORIDE CRYS ER 20 MEQ PO TBCR
40.0000 meq | EXTENDED_RELEASE_TABLET | Freq: Once | ORAL | Status: AC
  Administered 2020-04-23: 40 meq via ORAL
  Filled 2020-04-23: qty 2

## 2020-04-23 MED ORDER — PANTOPRAZOLE SODIUM 40 MG PO TBEC
40.0000 mg | DELAYED_RELEASE_TABLET | Freq: Every day | ORAL | Status: DC
  Administered 2020-04-24: 40 mg via ORAL
  Filled 2020-04-23: qty 1

## 2020-04-23 MED ORDER — HYDRALAZINE HCL 25 MG PO TABS
50.0000 mg | ORAL_TABLET | Freq: Three times a day (TID) | ORAL | Status: DC
  Administered 2020-04-23 – 2020-04-24 (×3): 50 mg via ORAL
  Filled 2020-04-23 (×3): qty 2

## 2020-04-23 MED ORDER — CARVEDILOL 12.5 MG PO TABS
12.5000 mg | ORAL_TABLET | Freq: Two times a day (BID) | ORAL | Status: DC
  Administered 2020-04-23 – 2020-04-24 (×3): 12.5 mg via ORAL
  Filled 2020-04-23 (×3): qty 1

## 2020-04-23 NOTE — Progress Notes (Addendum)
Progress Note  Patient Name: Sarah Bonilla Date of Encounter: 04/23/2020  Primary Cardiologist: Dina Rich, MD   Subjective   No chest pain or palpitations. Breathing back to baseline. Still on IV NTG given elevated BP. Patient's nurse reports family has been bringing outside fast-food to the room.   Inpatient Medications    Scheduled Meds:  amLODipine  10 mg Oral Daily   Chlorhexidine Gluconate Cloth  6 each Topical Daily   dextromethorphan-guaiFENesin  1 tablet Oral BID   feeding supplement  237 mL Oral BID BM   hydrALAZINE  50 mg Oral Q8H   lisinopril  40 mg Oral Daily   metoprolol tartrate  25 mg Oral BID   pantoprazole (PROTONIX) IV  40 mg Intravenous Daily   Continuous Infusions:  nitroGLYCERIN 7 mcg/min (04/22/20 2202)   PRN Meds: acetaminophen, iohexol, ipratropium-albuterol, labetalol   Vital Signs    Vitals:   04/23/20 0500 04/23/20 0600 04/23/20 0700 04/23/20 0800  BP:  (!) 176/76 (!) 192/93 (!) 202/89  Pulse: 85  82 75  Resp: (!) 23 (!) 22 20 17   Temp:    98.2 F (36.8 C)  TempSrc:    Oral  SpO2: (!) 84%  91% 95%  Weight:      Height:        Intake/Output Summary (Last 24 hours) at 04/23/2020 0904 Last data filed at 04/23/2020 0500 Gross per 24 hour  Intake 468.87 ml  Output --  Net 468.87 ml    Last 3 Weights 04/23/2020 04/22/2020 04/21/2020  Weight (lbs) 88 lb 2.9 oz 94 lb 5.7 oz 93 lb 7.6 oz  Weight (kg) 40 kg 42.8 kg 42.4 kg      Telemetry    NSR, HR in 70's to 80's. No significant arrhythmias.  - Personally Reviewed  ECG    No new tracings.   Physical Exam   General: Thin female appearing in no acute distress. Head: Normocephalic, atraumatic.  Neck: Supple without bruits, JVD not elevated. Lungs:  Resp regular and unlabored, CTA. Heart: RRR, S1, S2, no S3, S4, or murmur; no rub. Abdomen: Soft, non-tender, non-distended with normoactive bowel sounds. No hepatomegaly. No rebound/guarding. No obvious abdominal  masses. Extremities: No clubbing, cyanosis, or edema. Distal pedal pulses are 2+ bilaterally. Neuro: Alert and oriented X 3. Moves all extremities spontaneously. Psych: Normal affect.  Labs    Chemistry Recent Labs  Lab 04/19/20 0255 04/19/20 0447 04/20/20 0502 04/21/20 0457 04/21/20 1348 04/22/20 0430 04/23/20 0801  NA 141   < > 140   < > 139 140 139  K 2.5*   < > 3.5   < > 3.9 3.6 3.2*  CL 103   < > 103   < > 104 104 104  CO2 27   < > 29   < > 28 26 26   GLUCOSE 109*   < > 144*   < > 113* 92 90  BUN 19   < > 22   < > 25* 24* 18  CREATININE 0.67   < > 0.56   < > 0.65 0.70 0.60  CALCIUM 8.8*   < > 8.9   < > 8.6* 8.9 8.6*  PROT 7.2  --  6.0*  --   --   --   --   ALBUMIN 3.3*  --  2.9*  --   --   --   --   AST 31  --  26  --   --   --   --  ALT 30  --  32  --   --   --   --   ALKPHOS 87  --  72  --   --   --   --   BILITOT 0.8  --  0.8  --   --   --   --   GFRNONAA >60   < > >60   < > >60 >60 >60  ANIONGAP 11   < > 8   < > 7 10 9    < > = values in this interval not displayed.     Hematology Recent Labs  Lab 04/20/20 0502 04/20/20 0502 04/20/20 1330 04/21/20 0457 04/22/20 0430  WBC 7.2  --   --  10.6* 9.3  RBC 2.59*  --  2.89* 2.71* 2.86*  HGB 7.5*   < > 8.6* 8.0* 8.4*  HCT 23.6*   < > 26.2* 25.4* 26.5*  MCV 91.1  --   --  93.7 92.7  MCH 29.0  --   --  29.5 29.4  MCHC 31.8  --   --  31.5 31.7  RDW 14.2  --   --  14.5 14.2  PLT 140*  --   --  150 165   < > = values in this interval not displayed.    Cardiac EnzymesNo results for input(s): TROPONINI in the last 168 hours. No results for input(s): TROPIPOC in the last 168 hours.   BNP Recent Labs  Lab 04/19/20 0255  BNP 1,081.0*     DDimer No results for input(s): DDIMER in the last 168 hours.   Radiology    No results found.  Cardiac Studies   Echocardiogram: 04/19/2020 IMPRESSIONS    1. Left ventricular ejection fraction, by estimation, is 55 to 60%. The  left ventricle has normal function.  The left ventricle has no regional  wall motion abnormalities. There is moderate left ventricular hypertrophy.  Left ventricular diastolic  parameters are consistent with Grade III diastolic dysfunction  (restrictive). Elevated left atrial pressure.  2. Right ventricular systolic function is normal. The right ventricular  size is mildly enlarged. There is severely elevated pulmonary artery  systolic pressure.  3. Left atrial size was severely dilated.  4. Right atrial size was severely dilated.  5. The mitral valve is normal in structure. Mild mitral valve  regurgitation. No evidence of mitral stenosis.  6. The aortic valve is tricuspid. Aortic valve regurgitation is not  visualized. No aortic stenosis is present.  7. The inferior vena cava is dilated in size with <50% respiratory  variability, suggesting right atrial pressure of 15 mmHg.    Patient Profile     64 y.o. female w/PMH of HTN (not on medical therapy for 10+ years) and tobacco use who presented for evaluation of worsening dyspnea. Found to have hypertensive emergency with SBP > 230 on admission.   Assessment & Plan    1. Acute Diastolic CHF Exacerbation - She presented with worsening dyspnea on exertion, orthopnea and edema. BNP elevated to 1081 in the setting of hypertensive emergency. Responded well to IV Lasix and has not required additional diuretic therapy in several days. Appears euvolemic by examination.    2. Elevated Troponin  - HS troponin elevation up to 78 this admission and most consistent with demand ischemia. Echo showed a preserved EF as outlined above. She denies any recent chest pain. No plans for further ischemic testing at this time.   3. Hypertensive Emergency - SBP > 230 on admission.  Still on IV NTG but dosing has been reduced. Currently receiving Lopressor 25mg  BID, Lisinopril 40mg  daily, Amlodipine 10mg  and Hydralazine 25mg  TID. SBP still greater than 200 on most recent check. Will further titrate  Hydralazine to 50mg  TID and switch from Lopressor 25mg  BID to Coreg 12.5mg  BID for improved BP effect and can hopefully stop her NTG drip. Given her long-standing BP, a SBP in the 160's would be appropriate and medications can be further adjusted as an outpatient.    4. COPD Exacerbation - Was treated for a COPD exacerbation this admission by the admitting team. Will need PFT's as an outpatiet to confirm her diagnosis and due to her severely elevated PASP by echo this admission.   For questions or updates, please contact [facility] Please consult [email or web address] for contact info under Cardiology/STEMI.   Signed, , PA-C 9:04 AM 04/23/2020 Pager: [phone]  Patient examined chart reviewed Discussed care with PA Exam with black female in no distress S4 gallop clear lungs BP elevated Increase hydralazine to 50 mg tid. Discussed poor choices in diet   MD [facility]

## 2020-04-23 NOTE — Progress Notes (Signed)
PROGRESS NOTE    Sarah Bonilla  GLO:[id number] DOB: [birth year] DOA: 04/19/2020 PCP: Patient, No Pcp Per   Brief Narrative:  Per HPI: Sarah Bonilla a 64 y.o.femalewith medical history significant forhypertension (noncompliant with medication) and tobacco abuse who presents to the emergency department due to 3-4 day onset of shortness of breath and leg swelling(L >R). Shortness of breath worsens with lying flat in bed and improves on sitting, is associated with dry cough. Patient does not have a PCP and has not been to any doctor for medical checkup in several years. She endorsed 45-pack-year smoking history. Patient denies chest pain, fever, chills, nausea, vomiting or abdominal pain.  -Patient has been admitted with acute hypoxemic respiratory failure likely in the setting of acute diastolic CHF exacerbation as noted on 2D echocardiogram performed 11/19. She is noted to have a chronic left lower extremity DVT and PE study has returned negative. She was being treated for potential COPD exacerbation on admission as well, but has no further bronchospasms and has been weaned off BiPAP after Lasix was given on admission.  She continues to require escalating doses of oral antihypertensive medications with assistance of cardiology and needs further weaning of her nitroglycerin drip.  Assessment & Plan:   Principal Problem:   Acute CHF (congestive heart failure) (HCC) Active Problems:   COPD (chronic obstructive pulmonary disease) (HCC)   Essential hypertension   Hypertensive emergency   Hypokalemia   Localized swelling of left lower extremity   Hypoalbuminemia   Elevated brain natriuretic peptide (BNP) level   Elevated troponin I level   Thrombocytopenia (HCC)   Tobacco abuse   Noncompliance with medication regimen  Acute hypoxemic respiratory failure secondary to acute diastolic CHF exacerbation-resolved -Currently resolved and patient is on room air -Continue to wean  nitroglycerin drip -No further need for Lasix noted as patient does not have any further volume overload -LVEF 55-60% with moderate LVH and grade 3 diastolic dysfunction noted 11/19  Hypertensive crisis-ongoing -Secondary to medication noncompliance and poor follow-up -Continue to wean off nitroglycerin drip with goal blood pressure 180/100 -Labetalol ordered as needed -Metoprolol changed to Coreg 12.5mg  BID -Lisinopril 40 mg  -Increased amlodipine to 10 mg on 11/22 -Added hydralazine 25 TID on 11/22, increased to 50mg  TID by Cardiology on 11/23  Hypokalemia -Replete orally -Recheck AM labs and Magnesium  Elevated troponin secondary to above -Appears to be demand related and was evaluated by cardiology -2D echocardiogram without wall motion abnormalities  Anemia-stable -No overt bleeding noted -Anemia panelwith no significant findings -Avoid heparin agents  Chronic left lower extremity DVT -No need for anticoagulation at this time -Recommend follow-up outpatient hematology for this as well as anemia as noted above  History of COPD with tobacco use -Breathing treatments as needed for bronchospasms -No acute exacerbation noted, will discontinue scheduled treatments as well as steroids which are likely exacerbating the tachycardia as well as blood pressure -Patient will need PFTs in outpatient setting with pulmonology follow-up -Counseled on cessation  Thrombocytopenia-improved -Avoid heparin agents and follow-up with hematology outpatient   DVT prophylaxis:SCDs Code Status:Full Family Communication:None at bedside Disposition Plan: Status is: Inpatient  Remains inpatient appropriate because:IV treatments appropriate due to intensity of illness or inability to take PO and Inpatient level of care appropriate due to severity of illness   Dispo: The patient is from:Home Anticipated d/c is 12/23 Anticipated d/c date is: 1  day Patient currently is not medically stable to d/c. Likely dc in am if stable bp noted  and weaned off nitroglycerin drip.  Consultants:  Cardiology  Procedures:  See below  Antimicrobials:  Anti-infectives (From admission, onward)   Start     Dose/Rate Route Frequency Ordered Stop   04/20/20 1000  azithromycin (ZITHROMAX) tablet 250 mg  Status:  Discontinued       "Followed by" Linked Group Details   250 mg Oral Daily 04/19/20 0639 04/20/20 1517   04/19/20 0800  azithromycin (ZITHROMAX) tablet 500 mg       "Followed by" Linked Group Details   500 mg Oral Daily 04/19/20 0639 04/19/20 0854       Subjective: Patient seen and evaluated today with no new acute complaints or concerns. No acute concerns or events noted overnight. She is eager to go home when able.  Objective: Vitals:   04/23/20 0700 04/23/20 0800 04/23/20 0900 04/23/20 1000  BP: (!) 192/93 (!) 202/89 (!) 176/92 (!) 181/83  Pulse: 82 75 84 86  Resp: 20 17 15 16   Temp:  98.2 F (36.8 C)    TempSrc:  Oral    SpO2: 91% 95% 95% 100%  Weight:      Height:        Intake/Output Summary (Last 24 hours) at 04/23/2020 1157 Last data filed at 04/23/2020 0900 Gross per 24 hour  Intake 471.64 ml  Output --  Net 471.64 ml   Filed Weights   04/21/20 0500 04/22/20 0500 04/23/20 0445  Weight: 42.4 kg 42.8 kg 40 kg    Examination:  General exam: Appears calm and comfortable  Respiratory system: Clear to auscultation. Respiratory effort normal. Cardiovascular system: S1 & S2 heard, RRR.  Gastrointestinal system: Abdomen is nondistended, soft and nontender.  Central nervous system: Alert and awake Extremities: No edema Skin: No rashes, lesions or ulcers Psychiatry: Judgement and insight appear normal. Mood & affect appropriate.     Data Reviewed: I have personally reviewed following labs and imaging studies  CBC: Recent Labs  Lab 04/19/20 0255 04/20/20 0502 04/20/20 1330 04/21/20 0457  04/22/20 0430  WBC 9.4 7.2  --  10.6* 9.3  NEUTROABS 7.0  --   --   --   --   HGB 8.8* 7.5* 8.6* 8.0* 8.4*  HCT 27.1* 23.6* 26.2* 25.4* 26.5*  MCV 91.9 91.1  --  93.7 92.7  PLT 146* 140*  --  150 165   Basic Metabolic Panel: Recent Labs  Lab 04/19/20 0255 04/19/20 0447 04/20/20 0502 04/21/20 0457 04/21/20 1348 04/22/20 0430 04/23/20 0801  NA 141   < > 140 141 139 140 139  K 2.5*   < > 3.5 2.7* 3.9 3.6 3.2*  CL 103   < > 103 101 104 104 104  CO2 27   < > 29 30 28 26 26   GLUCOSE 109*   < > 144* 102* 113* 92 90  BUN 19   < > 22 25* 25* 24* 18  CREATININE 0.67   < > 0.56 0.63 0.65 0.70 0.60  CALCIUM 8.8*   < > 8.9 8.9 8.6* 8.9 8.6*  MG 1.7  --  2.0 1.9  --  1.9 1.8  PHOS  --   --  3.5  --   --   --   --    < > = values in this interval not displayed.   GFR: Estimated Creatinine Clearance: 45.5 mL/min (by C-G formula based on SCr of 0.6 mg/dL). Liver Function Tests: Recent Labs  Lab 04/19/20 0255 04/20/20 0502  AST 31 26  ALT 30 32  ALKPHOS 87 72  BILITOT 0.8 0.8  PROT 7.2 6.0*  ALBUMIN 3.3* 2.9*   No results for input(s): LIPASE, AMYLASE in the last 168 hours. No results for input(s): AMMONIA in the last 168 hours. Coagulation Profile: Recent Labs  Lab 04/20/20 0502  INR 1.2   Cardiac Enzymes: No results for input(s): CKTOTAL, CKMB, CKMBINDEX, TROPONINI in the last 168 hours. BNP (last 3 results) No results for input(s): PROBNP in the last 8760 hours. HbA1C: No results for input(s): HGBA1C in the last 72 hours. CBG: No results for input(s): GLUCAP in the last 168 hours. Lipid Profile: No results for input(s): CHOL, HDL, LDLCALC, TRIG, CHOLHDL, LDLDIRECT in the last 72 hours. Thyroid Function Tests: No results for input(s): TSH, T4TOTAL, FREET4, T3FREE, THYROIDAB in the last 72 hours. Anemia Panel: Recent Labs    04/20/20 1330  VITAMINB12 194  FOLATE 10.1  FERRITIN 152  TIBC 258  IRON 43  RETICCTPCT 2.6   Sepsis Labs: Recent Labs  Lab  04/20/20 1330  PROCALCITON <0.10    Recent Results (from the past 240 hour(s))  Resp Panel by RT-PCR (Flu A&B, Covid) Nasopharyngeal Swab     Status: None   Collection Time: 04/19/20  2:55 AM   Specimen: Nasopharyngeal Swab; Nasopharyngeal(NP) swabs in vial transport medium  Result Value Ref Range Status   SARS Coronavirus 2 by RT PCR NEGATIVE NEGATIVE Final    Comment: (NOTE) SARS-CoV-2 target nucleic acids are NOT DETECTED.  The SARS-CoV-2 RNA is generally detectable in upper respiratory specimens during the acute phase of infection. The lowest concentration of SARS-CoV-2 viral copies this assay can detect is 138 copies/mL. A negative result does not preclude SARS-Cov-2 infection and should not be used as the sole basis for treatment or other patient management decisions. A negative result may occur with  improper specimen collection/handling, submission of specimen other than nasopharyngeal swab, presence of viral mutation(s) within the areas targeted by this assay, and inadequate number of viral copies(<138 copies/mL). A negative result must be combined with clinical observations, patient history, and epidemiological information. The expected result is Negative.  Fact Sheet for Patients:  BloggerCourse.com  Fact Sheet for Healthcare Providers:  SeriousBroker.it  This test is no t yet approved or cleared by the [place] FDA and  has been authorized for detection and/or diagnosis of SARS-CoV-2 by FDA under an Emergency Use Authorization (EUA). This EUA will remain  in effect (meaning this test can be used) for the duration of the COVID-19 declaration under Section 564(b)(1) of the Act, 21 U.S.C.section 360bbb-3(b)(1), unless the authorization is terminated  or revoked sooner.       Influenza A by PCR NEGATIVE NEGATIVE Final   Influenza B by PCR NEGATIVE NEGATIVE Final    Comment: (NOTE) The Xpert Xpress  SARS-CoV-2/FLU/RSV plus assay is intended as an aid in the diagnosis of influenza from Nasopharyngeal swab specimens and should not be used as a sole basis for treatment. Nasal washings and aspirates are unacceptable for Xpert Xpress SARS-CoV-2/FLU/RSV testing.  Fact Sheet for Patients: BloggerCourse.com  Fact Sheet for Healthcare Providers: SeriousBroker.it  This test is not yet approved or cleared by the [place] FDA and has been authorized for detection and/or diagnosis of SARS-CoV-2 by FDA under an Emergency Use Authorization (EUA). This EUA will remain in effect (meaning this test can be used) for the duration of the COVID-19 declaration under Section 564(b)(1) of the Act, 21 U.S.C. section 360bbb-3(b)(1), unless the authorization is terminated  or revoked.  Performed at [facility], [address] [place], [place] [address]   MRSA PCR Screening     Status: None   Collection Time: 04/19/20  4:05 PM   Specimen: Nasal Mucosa; Nasopharyngeal  Result Value Ref Range Status   MRSA by PCR NEGATIVE NEGATIVE Final    Comment:        The GeneXpert MRSA Assay (FDA approved for NASAL specimens only), is one component of a comprehensive MRSA colonization surveillance program. It is not intended to diagnose MRSA infection nor to guide or monitor treatment for MRSA infections. Performed at [facility], [address] [place], [place] [address]          Radiology Studies: No results found.      Scheduled Meds: . amLODipine  10 mg Oral Daily  . carvedilol  12.5 mg Oral BID WC  . Chlorhexidine Gluconate Cloth  6 each Topical Daily  . dextromethorphan-guaiFENesin  1 tablet Oral BID  . feeding supplement  237 mL Oral BID BM  . hydrALAZINE  50 mg Oral Q8H  . lisinopril  40 mg Oral Daily  . [START ON 04/24/2020] pantoprazole  40 mg Oral Daily  . potassium chloride  40 mEq Oral Once   Continuous Infusions: .  nitroGLYCERIN 7 mcg/min (04/23/20 0900)     LOS: 4 days    Time spent: 30 minutes    Nicholi Ghuman Hoover Brunette Makynzee Tigges, DO [facility]  If 7PM-7AM, please contact night-coverage [email or web address] 04/23/2020, 11:57 AM

## 2020-04-24 DIAGNOSIS — Z9114 Patient's other noncompliance with medication regimen: Secondary | ICD-10-CM

## 2020-04-24 DIAGNOSIS — M7989 Other specified soft tissue disorders: Secondary | ICD-10-CM

## 2020-04-24 DIAGNOSIS — E876 Hypokalemia: Secondary | ICD-10-CM

## 2020-04-24 DIAGNOSIS — Z720 Tobacco use: Secondary | ICD-10-CM

## 2020-04-24 DIAGNOSIS — I161 Hypertensive emergency: Secondary | ICD-10-CM

## 2020-04-24 DIAGNOSIS — R778 Other specified abnormalities of plasma proteins: Secondary | ICD-10-CM

## 2020-04-24 DIAGNOSIS — J810 Acute pulmonary edema: Secondary | ICD-10-CM

## 2020-04-24 LAB — BASIC METABOLIC PANEL
Anion gap: 8 (ref 5–15)
BUN: 17 mg/dL (ref 8–23)
CO2: 23 mmol/L (ref 22–32)
Calcium: 8.9 mg/dL (ref 8.9–10.3)
Chloride: 107 mmol/L (ref 98–111)
Creatinine, Ser: 0.65 mg/dL (ref 0.44–1.00)
GFR, Estimated: >60 mL/min (ref >60)
Glucose, Bld: 95 mg/dL (ref 70–99)
Potassium: 4 mmol/L (ref 3.5–5.1)
Sodium: 138 mmol/L (ref 135–145)

## 2020-04-24 LAB — MAGNESIUM: Magnesium: 1.9 mg/dL (ref 1.7–2.4)

## 2020-04-24 MED ORDER — HYDRALAZINE HCL 25 MG PO TABS
75.0000 mg | ORAL_TABLET | Freq: Three times a day (TID) | ORAL | Status: DC
  Administered 2020-04-24: 75 mg via ORAL
  Filled 2020-04-24: qty 3

## 2020-04-24 MED ORDER — CARVEDILOL 25 MG PO TABS: 25.0000 mg | ORAL_TABLET | Freq: Two times a day (BID) | ORAL | 2 refills | Status: AC

## 2020-04-24 MED ORDER — AMLODIPINE BESYLATE 10 MG PO TABS
10.0000 mg | ORAL_TABLET | Freq: Every day | ORAL | 2 refills | Status: DC
Start: 2020-04-25 — End: 2020-08-21

## 2020-04-24 MED ORDER — LISINOPRIL 40 MG PO TABS
40.0000 mg | ORAL_TABLET | Freq: Every day | ORAL | 2 refills | Status: DC
Start: 2020-04-25 — End: 2020-11-16

## 2020-04-24 MED ORDER — HYDRALAZINE HCL 25 MG PO TABS
75.0000 mg | ORAL_TABLET | Freq: Three times a day (TID) | ORAL | 2 refills | Status: DC
Start: 2020-04-24 — End: 2020-08-21

## 2020-04-24 MED ORDER — PANTOPRAZOLE SODIUM 40 MG PO TBEC
40.0000 mg | DELAYED_RELEASE_TABLET | Freq: Every day | ORAL | 1 refills | Status: DC
Start: 2020-04-25 — End: 2020-11-16

## 2020-04-24 NOTE — Progress Notes (Signed)
Nsg Discharge Note  Admit Date:  04/19/2020 Discharge date: 04/24/2020   Valentina Shaggy to be D/C'd Home per MD order.  AVS completed.  Copy for chart, and copy for patient signed, and dated. Patient/caregiver able to verbalize understanding.  Discharge Medication: Allergies as of 04/24/2020   No Known Allergies     Medication List    STOP taking these medications   ibuprofen 200 MG tablet Commonly known as: ADVIL   potassium chloride SA 20 MEQ tablet Commonly known as: KLOR-CON     TAKE these medications   acetaminophen 325 MG tablet Commonly known as: TYLENOL Take 650 mg by mouth every 6 (six) hours as needed for moderate pain or headache.   amLODipine 10 MG tablet Commonly known as: NORVASC Take 1 tablet (10 mg total) by mouth daily. Start taking on: April 25, 2020   carvedilol 25 MG tablet Commonly known as: COREG Take 1 tablet (25 mg total) by mouth 2 (two) times daily with a meal.   hydrALAZINE 25 MG tablet Commonly known as: APRESOLINE Take 3 tablets (75 mg total) by mouth every 8 (eight) hours.   lisinopril 40 MG tablet Commonly known as: ZESTRIL Take 1 tablet (40 mg total) by mouth daily. Start taking on: April 25, 2020 What changed:   medication strength  how much to take   pantoprazole 40 MG tablet Commonly known as: PROTONIX Take 1 tablet (40 mg total) by mouth daily. Start taking on: April 25, 2020       Discharge Assessment: Vitals:   04/24/20 1445 04/24/20 1530  BP: (!) 175/63 (!) 176/62  Pulse: 85 82  Resp: 18 (!) 21  Temp:    SpO2: 96% 98%   Skin clean, dry and intact without evidence of skin break down, no evidence of skin tears noted. IV catheter discontinued intact. Site without signs and symptoms of complications - no redness or edema noted at insertion site, patient denies c/o pain - only slight tenderness at site.  Dressing with slight pressure applied.  D/c Instructions-Education: Discharge instructions given to  patient/family with verbalized understanding. D/c education completed with patient/family including follow up instructions, medication list, d/c activities limitations if indicated, with other d/c instructions as indicated by MD - patient able to verbalize understanding, all questions fully answered. Patient instructed to return to ED, call 911, or call MD for any changes in condition.  Patient escorted via WC, and D/C home via private auto.  Diego Cory, RN 04/24/2020 3:57 PM

## 2020-04-24 NOTE — Progress Notes (Addendum)
Progress Note  Patient Name: Sarah Bonilla Date of Encounter: 04/24/2020  Primary Cardiologist: Dina Rich, MD   Subjective   SBP > 200 overnight which led to IV NTG being titrated to 20 mcg/min. Rechecked at the time of this encounter and at 122/96. She denies any chest pain or palpitations. Breathing at baseline.   Inpatient Medications    Scheduled Meds: . amLODipine  10 mg Oral Daily  . carvedilol  12.5 mg Oral BID WC  . Chlorhexidine Gluconate Cloth  6 each Topical Daily  . dextromethorphan-guaiFENesin  1 tablet Oral BID  . feeding supplement  237 mL Oral BID BM  . hydrALAZINE  50 mg Oral Q8H  . lisinopril  40 mg Oral Daily  . pantoprazole  40 mg Oral Daily   Continuous Infusions: . nitroGLYCERIN 20 mcg/min (04/24/20 0504)   PRN Meds: acetaminophen, iohexol, ipratropium-albuterol, labetalol   Vital Signs    Vitals:   04/24/20 0215 04/24/20 0230 04/24/20 0236 04/24/20 0453  BP: (!) 205/93 (!) 217/99 (!) 214/96 (!) 201/93  Pulse: 82 86 84   Resp: 19 (!) 22 17   Temp:    98.6 F (37 C)  TempSrc:    Oral  SpO2: 98% 96% 95%   Weight:      Height:        Intake/Output Summary (Last 24 hours) at 04/24/2020 0754 Last data filed at 04/24/2020 0504 Gross per 24 hour  Intake 65.87 ml  Output --  Net 65.87 ml    Last 3 Weights 04/23/2020 04/22/2020 04/21/2020  Weight (lbs) 88 lb 2.9 oz 94 lb 5.7 oz 93 lb 7.6 oz  Weight (kg) 40 kg 42.8 kg 42.4 kg      Telemetry    NSR, HR in 70's to 80's. No significant arrhythmias. - Personally Reviewed  ECG    No new tracings.   Physical Exam   General: Well developed, well nourished, female appearing in no acute distress. Head: Normocephalic, atraumatic.  Neck: Supple without bruits, JVD not elevated. Lungs:  Resp regular and unlabored, CTA without wheezing or rales. Heart: RRR, S1, S2, no S3, S4, or murmur; no rub. Abdomen: Soft, non-tender, non-distended with normoactive bowel sounds. No hepatomegaly.  No rebound/guarding. No obvious abdominal masses. Extremities: No clubbing, cyanosis, or edema. Distal pedal pulses are 2+ bilaterally. Neuro: Alert and oriented X 3. Moves all extremities spontaneously. Psych: Normal affect.  Labs    Chemistry Recent Labs  Lab 04/19/20 0255 04/19/20 0447 04/20/20 0502 04/21/20 0457 04/22/20 0430 04/23/20 0801 04/24/20 0412  NA 141   < > 140   < > 140 139 138  K 2.5*   < > 3.5   < > 3.6 3.2* 4.0  CL 103   < > 103   < > 104 104 107  CO2 27   < > 29   < > 26 26 23   GLUCOSE 109*   < > 144*   < > 92 90 95  BUN 19   < > 22   < > 24* 18 17  CREATININE 0.67   < > 0.56   < > 0.70 0.60 0.65  CALCIUM 8.8*   < > 8.9   < > 8.9 8.6* 8.9  PROT 7.2  --  6.0*  --   --   --   --   ALBUMIN 3.3*  --  2.9*  --   --   --   --   AST 31  --  26  --   --   --   --   ALT 30  --  32  --   --   --   --   ALKPHOS 87  --  72  --   --   --   --   BILITOT 0.8  --  0.8  --   --   --   --   GFRNONAA >60   < > >60   < > >60 >60 >60  ANIONGAP 11   < > 8   < > 10 9 8    < > = values in this interval not displayed.     Hematology Recent Labs  Lab 04/20/20 0502 04/20/20 0502 04/20/20 1330 04/21/20 0457 04/22/20 0430  WBC 7.2  --   --  10.6* 9.3  RBC 2.59*  --  2.89* 2.71* 2.86*  HGB 7.5*   < > 8.6* 8.0* 8.4*  HCT 23.6*   < > 26.2* 25.4* 26.5*  MCV 91.1  --   --  93.7 92.7  MCH 29.0  --   --  29.5 29.4  MCHC 31.8  --   --  31.5 31.7  RDW 14.2  --   --  14.5 14.2  PLT 140*  --   --  150 165   < > = values in this interval not displayed.    BNP Recent Labs  Lab 04/19/20 0255  BNP 1,081.0*     Radiology    No results found.  Cardiac Studies   Echocardiogram: 04/19/2020 IMPRESSIONS    1. Left ventricular ejection fraction, by estimation, is 55 to 60%. The  left ventricle has normal function. The left ventricle has no regional  wall motion abnormalities. There is moderate left ventricular hypertrophy.  Left ventricular diastolic  parameters are  consistent with Grade III diastolic dysfunction  (restrictive). Elevated left atrial pressure.  2. Right ventricular systolic function is normal. The right ventricular  size is mildly enlarged. There is severely elevated pulmonary artery  systolic pressure.  3. Left atrial size was severely dilated.  4. Right atrial size was severely dilated.  5. The mitral valve is normal in structure. Mild mitral valve  regurgitation. No evidence of mitral stenosis.  6. The aortic valve is tricuspid. Aortic valve regurgitation is not  visualized. No aortic stenosis is present.  7. The inferior vena cava is dilated in size with <50% respiratory  variability, suggesting right atrial pressure of 15 mmHg.   Patient Profile     64 y.o. female w/PMH of HTN (not on medical therapy for 10+ years) and tobacco use who presented for evaluation of worsening dyspnea. Found to have hypertensive emergency with SBP > 230 on admission.  Assessment & Plan    1. Acute Diastolic CHF Exacerbation/Hypertensive Heart Disease -She was volume overloaded at the time of admission and BNP was elevated to 1081. She responded well to IV Lasix and has not required additional diuretic therapy.   2. Elevated Troponin - Troponin values peaked at 78 this admission and EKG was without acute ST changes. Echocardiogram showed a preserved EF as outlined above with no regional wall motion normalities. She did have hypertensive heart disease with moderate LVH and grade 3 diastolic dysfunction. No plans for further inpatient ischemic evaluation at this time.    3. Hypertensive Emergency - SBP > 230 on admission and she reported having not been on medical therapy for over 10+ years. Currently on Amlodipine 10mg  daily, Coreg 12.5mg   BID, Hydralazine 50mg  TID and Lisinopril 40mg  daily. BP recorded at 122/96 this AM so unsure if the patient's movement has been playing a role in her elevated readings overnight. Discussed with patient's nurse  and will continue to decrease IV NTG and can hopefully discontinue today. Will titrate Hydralazine to 75mg  TID. Can hopefully discharge home later today if IV NTG discontinued. If BP remains significantly elevated, would further titrate Coreg or add Spironolactone.   4. COPD Exacerbation - Treated with antibiotics and steroids this admission. Will need PFT's as an outpatient given newly diagnosed COPD and severely elevated PASP by echocardiogram.    For questions or updates, please contact [facility] Please consult [email or web address] for contact info under Cardiology/STEMI.   , PA-C 7:54 AM 04/24/2020 Pager: [phone]   Attending note:  Patient seen and examined.  I reviewed initial cardiology consultation and recent medication adjustments.  Blood pressure has remained elevated, she has remained on IV nitroglycerin although was in the process of having this weaned.  This morning's blood pressure looks better.  She does not report any chest pain or breathlessness.  She is afebrile, most recent blood pressure recorded at 122/96 although prior to that 200/84.  Lungs exhibit decreased breath sounds without wheezing.  Cardiac exam with RRR no gallop.  Pertinent lab work includes potassium 4.0, BUN 17, creatinine 0.65, hemoglobin 8.4, platelets 165.  Echocardiogram reveals LVEF 55 to 60% with moderate LVH and restrictive diastolic filling pattern, also severe pulmonary hypertension with normal RV contraction.  Current antihypertensive regimen includes Norvasc, Coreg, hydralazine, and lisinopril.  Plan to uptitrate hydralazine to 75 mg 3 times daily, next step would be adding spironolactone.  Would wean off IV nitroglycerin completely and aim for discharge home with continued medication adjustments as an outpatient.  Lorri Frederick, M.D., F.A.C.C.

## 2020-04-24 NOTE — Discharge Summary (Signed)
Physician Discharge Summary  Sarah Bonilla:[id number] DOB: July 06, 1955 DOA: 04/19/2020  PCP: Patient, No Pcp Per  Admit date: 04/19/2020 Discharge date: 04/24/2020  Time spent: 35 minutes  Recommendations for Outpatient Follow-up:  1. Repeat CBC to follow hemoglobin trend 2. Repeat basic metabolic panel to follow electrolytes renal function 3. Reassess blood pressure and further adjust antihypertensive regimen 4. Continue assisting patient with tobacco cessation 5. Arrange outpatient follow-up with pulmonologist for PFTs and further maintenance treatment of underlying COPD history.   Discharge Diagnoses:  Principal Problem:   Acute diastolic CHF (congestive heart failure) (HCC)   COPD (chronic obstructive pulmonary disease) (HCC)   Essential hypertension   Hypertensive emergency   Hypokalemia   Elevated brain natriuretic peptide (BNP) level   Elevated troponin I level   Tobacco abuse   Noncompliance with medication regimen   Acute pulmonary edema (HCC)   Leg swelling (chronic DVT)   Discharge Condition:   Diet recommendation: Heart healthy/low-sodium diet.  Filed Weights   04/21/20 0500 04/22/20 0500 04/23/20 0445  Weight: 42.4 kg 42.8 kg 40 kg    History of present illness:  As per H&P written by Dr. Thomes Dinning on 04/19/2020 Sarah Bonilla is a 64 y.o. female with medical history significant for hypertension (noncompliant with medication) and tobacco abuse who presents to the emergency department due to 3-4 day onset of shortness of breath and leg swelling (L > R).  Shortness of breath worsens with lying flat in bed and improves on sitting, is associated with dry cough.  Patient does not have a PCP and has not been to any doctor for medical checkup in several years.  She endorsed 45-pack-year smoking history.  Patient denies chest pain, fever, chills, nausea, vomiting or abdominal pain.  ED Course:  In the emergency department, rated at 230/107, she was tachycardic  and hypoxic on room air with an O2 sat of 89%.  This improved to 92-95% on supplemental oxygen via [place] at 2 LPM.  Work-up in the ED showed normal CBC and BMP except for mild thrombocytopenia and hypokalemia.  Albumin 3.3, BNP 1081.0, troponin x2-58 > 75.  Respiratory panel for influenza A, B and SARS coronavirus 2 was negative.  Chest x-ray showed COPD, right basilar focal pulmonary infiltrate, likely infectious in the appropriate clinical setting. She  was treated with IV hydralazine, magnesium was given, potassium was replenished.  While patient was still in the ED, she started to complain of worsening shortness of breath and she was seen tripoding on the side of the bed with O2 sats of 75% on 2 L via [place].  Oxygen was increased so 4 LPM, but O2 sat did not improve more than 85%, she was then placed on NRB at 10 L with O2 sat at 92%.  Patient was then placed on BiPAP by ED physician and IV nitroglycerin was started.  Hospitalist was asked to admit patient for further evaluation and management.  Hospital Course:  1-acute hypoxemic respiratory failure secondary to acute diastolic CHF exacerbation/pulmonary edema -Completely resolve at time of discharge -No requiring oxygen supplementation -2D echo demonstrated ejection fraction of 55 to 60%; moderate LVH and grade 3 diastolic dysfunction. -After discussing with cardiology service there was no further needs of Lasix at time of discharge. -Patient instructed to follow low-sodium diet and to check her weight on daily basis.  2-hypertensive emergency -In the setting of medication noncompliance and diet indiscretion -Patient required to be on nitroglycerin drip -At discharge we will continue the use  of carvedilol 25 mg twice a day, lisinopril 40 mg daily, amlodipine 10 mg daily and hydralazine 75mg  3 times a day. -Close monitoring of patient's blood pressure with further adjustment as needed; currently, next recommended option will be initiation of the  spironolactone. -Cardiology service will follow her up as an outpatient.  3-hypokalemia -Repleted and within normal limits at discharge.  4-elevated troponin -In the setting of hypertensive crisis -No chest pain and reassuring 2D echo without wall motion normalities. -Continue blood pressure management.  5-history of COPD with tobacco use -No acute exacerbation noted -Patient advised to stop tobacco use -Will need PFTs as an outpatient. -Continue as needed bronchodilators.  6-chronic left lower extremity DVT -No need for anticoagulation at this time -Would recommend outpatient follow-up with hematology secondary to chronic anemia.  7-chronic anemia -No overt bleeding noted -Outpatient follow-up with hematology recommended.   Procedures: See below for x-ray reports. 2D echo: 1. Left ventricular ejection fraction, by estimation, is 55 to 60%. The  left ventricle has normal function. The left ventricle has no regional  wall motion abnormalities. There is moderate left ventricular hypertrophy.  Left ventricular diastolic  parameters are consistent with Grade III diastolic dysfunction  (restrictive). Elevated left atrial pressure.  2. Right ventricular systolic function is normal. The right ventricular  size is mildly enlarged. There is severely elevated pulmonary artery  systolic pressure.  3. Left atrial size was severely dilated.  4. Right atrial size was severely dilated.  5. The mitral valve is normal in structure. Mild mitral valve  regurgitation. No evidence of mitral stenosis.  6. The aortic valve is tricuspid. Aortic valve regurgitation is not  visualized. No aortic stenosis is present.  7. The inferior vena cava is dilated in size with <50% respiratory  variability, suggesting right atrial pressure of 15 mmHg.  Consultations:  Neurology service  Discharge Exam: Vitals:   04/24/20 1430 04/24/20 1445  BP: (!) 162/84 (!) 175/63  Pulse: 87 85  Resp: (!)  22 18  Temp:    SpO2: 99% 96%    General: No chest pain, no nausea, no vomiting.  Patient reports no palpitations and expressing her breathing is at baseline currently. Cardiovascular: Rate controlled, no rubs, no gallops, no JVD on exam. Respiratory: Good air movement bilaterally; no using accessory muscle.  Normal respiratory effort.  Good saturation on room air. Abdomen: Soft, nontender, , Positive bowel sounds Extremities: No cyanosis or clubbing.  Discharge Instructions   Discharge Instructions    Diet - low sodium heart healthy   Complete by: As directed    Discharge instructions   Complete by: As directed    Follow heart healthy diet Maintain adequate hydration Follow-up with cardiology service (office will contact you with appointment details) Follow-up with PCP in the next 10 days Be compliant with your medications and take them as prescribed.     Allergies as of 04/24/2020   No Known Allergies     Medication List    STOP taking these medications   ibuprofen 200 MG tablet Commonly known as: ADVIL   potassium chloride SA 20 MEQ tablet Commonly known as: KLOR-CON     TAKE these medications   acetaminophen 325 MG tablet Commonly known as: TYLENOL Take 650 mg by mouth every 6 (six) hours as needed for moderate pain or headache.   amLODipine 10 MG tablet Commonly known as: NORVASC Take 1 tablet (10 mg total) by mouth daily. Start taking on: April 25, 2020   carvedilol 25 MG  tablet Commonly known as: COREG Take 1 tablet (25 mg total) by mouth 2 (two) times daily with a meal.   hydrALAZINE 25 MG tablet Commonly known as: APRESOLINE Take 3 tablets (75 mg total) by mouth every 8 (eight) hours.   lisinopril 40 MG tablet Commonly known as: ZESTRIL Take 1 tablet (40 mg total) by mouth daily. Start taking on: April 25, 2020 What changed:   medication strength  how much to take   pantoprazole 40 MG tablet Commonly known as: PROTONIX Take 1  tablet (40 mg total) by mouth daily. Start taking on: April 25, 2020      No Known Allergies  Follow-up Information    Ellsworth Lennox, PA-C Follow up on 05/07/2020.   Specialties: Physician Assistant, Cardiology Why: Cardiology Hospital Follow-up on 05/07/2020 at 3:30 PM.  Contact information: [address] [place] [address] [phone]        Antoine Poche, MD .   Specialty: Cardiology Contact information: [address] [place] [address] [phone]               The results of significant diagnostics from this hospitalization (including imaging, microbiology, ancillary and laboratory) are listed below for reference.    Significant Diagnostic Studies: CT ANGIO CHEST PE W OR WO CONTRAST  Result Date: 04/19/2020 CLINICAL DATA:  Shortness of breath beginning yesterday with nonproductive cough. Hypoxia. Evaluate for pulmonary embolism. EXAM: CT ANGIOGRAPHY CHEST WITH CONTRAST TECHNIQUE: Multidetector CT imaging of the chest was performed using the standard protocol during bolus administration of intravenous contrast. Multiplanar CT image reconstructions and MIPs were obtained to evaluate the vascular anatomy. CONTRAST:  31mL OMNIPAQUE IOHEXOL 350 MG/ML SOLN COMPARISON:  None. FINDINGS: Cardiovascular: Borderline cardiomegaly. Moderate calcified plaque over the left main and 3 vessel coronary arteries. Thoracic aorta is normal in caliber with minimal calcified plaque present. Pulmonary arterial system is well opacified and demonstrates no evidence of emboli. Mediastinum/Nodes: There is no significant mediastinal or hilar adenopathy. Remaining mediastinal structures are normal. Lungs/Pleura: Lungs are adequately inflated with minimal centrilobular emphysematous disease present. Examination demonstrates patchy bilateral mixed interstitial airspace opacification with nodularity worse over the posterior right upper lobe and right lower lobe likely due to atypical  infection or inflammatory disease. Small bilateral pleural effusions with associated bibasilar atelectasis. Airways are normal. Upper Abdomen: Calcified plaque over the abdominal aorta. Mild patchy low-attenuation over the left renal cortex which may be partly due to streak artifact that is present. Musculoskeletal: No focal abnormality. Review of the MIP images confirms the above findings. IMPRESSION: 1. No evidence of pulmonary embolism. 2. Patchy bilateral mixed interstitial airspace process with nodularity worse over the posterior right upper lobe and right lower lobe likely due to an atypical infectious or inflammatory process. Small bilateral pleural effusions with associated bibasilar atelectasis. 3. Emphysema and aortic atherosclerosis. Atherosclerotic coronary artery disease. 4. Mild patchy low-attenuation over the left renal cortex which is likely due to streak artifact and less likely pyelonephritis. Recommend clinical correlation. Aortic Atherosclerosis (ICD10-I70.0) and Emphysema (ICD10-J43.9). Electronically Signed   By: Elberta Fortis M.D.   On: 04/19/2020 15:07   US Venous Img Lower Bilateral (DVT)  Addendum Date: 04/19/2020   ADDENDUM REPORT: 04/19/2020 11:03 ADDENDUM: Study discussed by telephone with Dr. Maurilio Lovely on 04/19/2020 at 1059 hours. Electronically Signed   By: Odessa Fleming M.D.   On: 04/19/2020 11:03   Result Date: 04/19/2020 CLINICAL DATA:  64 year old female with lower extremity swelling. EXAM: BILATERAL LOWER EXTREMITY VENOUS  DOPPLER ULTRASOUND TECHNIQUE: Gray-scale sonography with graded compression, as well as color Doppler and duplex ultrasound were performed to evaluate the lower extremity deep venous systems from the level of the common femoral vein and including the common femoral, femoral, profunda femoral, popliteal and calf veins including the posterior tibial, peroneal and gastrocnemius veins when visible. The superficial great saphenous vein was also interrogated.  Spectral Doppler was utilized to evaluate flow at rest and with distal augmentation maneuvers in the common femoral, femoral and popliteal veins. COMPARISON:  Portable chest x-ray 04/19/2020. FINDINGS: RIGHT LOWER EXTREMITY Common Femoral Vein: No evidence of thrombus. Normal compressibility, respiratory phasicity and response to augmentation. Saphenofemoral Junction: No evidence of thrombus. Normal compressibility and flow on color Doppler imaging. Profunda Femoral Vein: No evidence of thrombus. Normal compressibility and flow on color Doppler imaging. Femoral Vein: No evidence of thrombus. Normal compressibility, respiratory phasicity and response to augmentation. Popliteal Vein: No evidence of thrombus. Normal compressibility, respiratory phasicity and response to augmentation. Calf Veins: No evidence of thrombus. Normal compressibility and flow on color Doppler imaging. Superficial Great Saphenous Vein: No evidence of thrombus. Normal compressibility. Other Findings:  None. LEFT LOWER EXTREMITY Common Femoral Vein: Incomplete compressibility of the left common femoral vein at the saphenous vein junction with echogenic thrombus visible within the lumen (image 45), but the vessel remains patent (image 48). Saphenofemoral Junction: Patent saphenous vein junction (image 53) with normal response to augmentation. Profunda Femoral Vein: No evidence of thrombus. Normal compressibility and flow on color Doppler imaging. Femoral Vein: No evidence of thrombus. Normal compressibility, respiratory phasicity and response to augmentation. Popliteal Vein: No evidence of thrombus. Normal compressibility, respiratory phasicity and response to augmentation. Calf Veins: No evidence of thrombus. Normal compressibility and flow on color Doppler imaging. Superficial Great Saphenous Vein: No evidence of thrombus. Normal compressibility. Other Findings:  Positive subcutaneous edema in the calf (image 83). IMPRESSION: 1. Positive for  nonocclusive deep venous thrombosis at the left common femoral vein. This might be chronic thrombus, uncertain. 2. The remainder of the left lower extremity deep venous system is normally patent. 3. Negative for right lower extremity DVT. Electronically Signed: By: Odessa Fleming M.D. On: 04/19/2020 10:48   DG Chest Portable 1 View  Result Date: 04/19/2020 CLINICAL DATA:  Dyspnea, nonproductive cough EXAM: PORTABLE CHEST 1 VIEW COMPARISON:  None. FINDINGS: The lungs are symmetrically hyperinflated in keeping with changes of underlying COPD. There is more focal infiltrate noted at the right lung base, possibly infectious or inflammatory in the acute setting. No pneumothorax or pleural effusion. Cardiac size within normal limits. Pulmonary vascularity is normal. IMPRESSION: COPD. Right basilar focal pulmonary infiltrate, likely infectious in the appropriate clinical setting. Electronically Signed   By: Helyn Numbers MD   On: 04/19/2020 04:06   ECHOCARDIOGRAM COMPLETE  Result Date: 04/19/2020    ECHOCARDIOGRAM REPORT   Patient Name:   KELSI BENHAM Date of Exam: 04/19/2020 Medical Rec #:  [id number]        Height:       62.0 in Accession #:    [id number]       Weight:       98.0 lb Date of Birth:  22-Oct-1955        BSA:          1.412 m Patient Age:    64 years         BP:           196/113 mmHg Patient Gender: F  HR:           102 bpm. [facility] Procedure: 2D Echo, Cardiac Doppler and Color Doppler Indications:    CHF  History:        Patient has no prior history of Echocardiogram examinations.                 Signs/Symptoms:Shortness of Breath; Risk Factors:Hypertension                 and Current Smoker. Elevated troponin.  Sonographer:    Lavenia Atlas RDCS Referring Phys: [id number] OLADAPO ADEFESO IMPRESSIONS  1. Left ventricular ejection fraction, by estimation, is 55 to 60%. The left ventricle has normal function. The left ventricle has no regional wall motion  abnormalities. There is moderate left ventricular hypertrophy. Left ventricular diastolic parameters are consistent with Grade III diastolic dysfunction (restrictive). Elevated left atrial pressure.  2. Right ventricular systolic function is normal. The right ventricular size is mildly enlarged. There is severely elevated pulmonary artery systolic pressure.  3. Left atrial size was severely dilated.  4. Right atrial size was severely dilated.  5. The mitral valve is normal in structure. Mild mitral valve regurgitation. No evidence of mitral stenosis.  6. The aortic valve is tricuspid. Aortic valve regurgitation is not visualized. No aortic stenosis is present.  7. The inferior vena cava is dilated in size with <50% respiratory variability, suggesting right atrial pressure of 15 mmHg. FINDINGS  Left Ventricle: Left ventricular ejection fraction, by estimation, is 55 to 60%. The left ventricle has normal function. The left ventricle has no regional wall motion abnormalities. The left ventricular internal cavity size was normal in size. There is  moderate left ventricular hypertrophy. Left ventricular diastolic parameters are consistent with Grade III diastolic dysfunction (restrictive). Elevated left atrial pressure. Right Ventricle: The right ventricular size is mildly enlarged. No increase in right ventricular wall thickness. Right ventricular systolic function is normal. There is severely elevated pulmonary artery systolic pressure. The tricuspid regurgitant velocity is 3.54 m/s, and with an assumed right atrial pressure of 10 mmHg, the estimated right ventricular systolic pressure is 60.1 mmHg. Left Atrium: Left atrial size was severely dilated. Right Atrium: Right atrial size was severely dilated. Pericardium: There is no evidence of pericardial effusion. Mitral Valve: The mitral valve is normal in structure. Mild mitral valve regurgitation. No evidence of mitral valve stenosis. Tricuspid Valve: The tricuspid  valve is normal in structure. Tricuspid valve regurgitation is not demonstrated. No evidence of tricuspid stenosis. Aortic Valve: The aortic valve is tricuspid. Aortic valve regurgitation is not visualized. No aortic stenosis is present. Aortic valve mean gradient measures 2.4 mmHg. Aortic valve peak gradient measures 4.8 mmHg. Aortic valve area, by VTI measures 1.97 cm. Pulmonic Valve: The pulmonic valve was not well visualized. Pulmonic valve regurgitation is not visualized. No evidence of pulmonic stenosis. Aorta: The aortic root is normal in size and structure. Pulmonary Artery: Indeterminant PASP, inadequate TR jet. Venous: The inferior vena cava is dilated in size with less than 50% respiratory variability, suggesting right atrial pressure of 15 mmHg. IAS/Shunts: No atrial level shunt detected by color flow Doppler.  LEFT VENTRICLE PLAX 2D LVIDd:         4.23 cm  Diastology LVIDs:         2.50 cm  LV e' medial:    4.24 cm/s LV PW:         1.20 cm  LV E/e' medial:  24.8 LV IVS:  1.21 cm  LV e' lateral:   5.55 cm/s LVOT diam:     1.80 cm  LV E/e' lateral: 18.9 LV SV:         34 LV SV Index:   24 LVOT Area:     2.54 cm  RIGHT VENTRICLE RV Basal diam:  2.99 cm RV S prime:     10.10 cm/s TAPSE (M-mode): 1.8 cm LEFT ATRIUM             Index       RIGHT ATRIUM           Index LA diam:        3.20 cm 2.27 cm/m  RA Area:     11.60 cm LA Vol (A2C):   23.3 ml 16.50 ml/m RA Volume:   24.30 ml  17.21 ml/m LA Vol (A4C):   31.4 ml 22.24 ml/m LA Biplane Vol: 29.3 ml 20.75 ml/m  AORTIC VALVE AV Area (Vmax):    1.79 cm AV Area (Vmean):   1.75 cm AV Area (VTI):     1.97 cm AV Vmax:           109.56 cm/s AV Vmean:          72.315 cm/s AV VTI:            0.171 m AV Peak Grad:      4.8 mmHg AV Mean Grad:      2.4 mmHg LVOT Vmax:         76.90 cm/s LVOT Vmean:        49.600 cm/s LVOT VTI:          0.132 m LVOT/AV VTI ratio: 0.77  AORTA Ao Root diam: 2.60 cm MITRAL VALVE                TRICUSPID VALVE MV Area (PHT):  6.43 cm     TR Peak grad:   50.1 mmHg MV Decel Time: 118 msec     TR Vmax:        354.00 cm/s MV E velocity: 105.00 cm/s MV A velocity: 50.90 cm/s   SHUNTS MV E/A ratio:  2.06         Systemic VTI:  0.13 m                             Systemic Diam: 1.80 cm Dina RichJonathan Branch MD Electronically signed by Dina RichJonathan Branch MD Signature Date/Time: 04/19/2020/11:11:52 AM    Final     Microbiology: Recent Results (from the past 240 hour(s))  Resp Panel by RT-PCR (Flu A&B, Covid) Nasopharyngeal Swab     Status: None   Collection Time: 04/19/20  2:55 AM   Specimen: Nasopharyngeal Swab; Nasopharyngeal(NP) swabs in vial transport medium  Result Value Ref Range Status   SARS Coronavirus 2 by RT PCR NEGATIVE NEGATIVE Final    Comment: (NOTE) SARS-CoV-2 target nucleic acids are NOT DETECTED.  The SARS-CoV-2 RNA is generally detectable in upper respiratory specimens during the acute phase of infection. The lowest concentration of SARS-CoV-2 viral copies this assay can detect is 138 copies/mL. A negative result does not preclude SARS-Cov-2 infection and should not be used as the sole basis for treatment or other patient management decisions. A negative result may occur with  improper specimen collection/handling, submission of specimen other than nasopharyngeal swab, presence of viral mutation(s) within the areas targeted by this assay, and inadequate number of viral copies(<138 copies/mL). A negative  result must be combined with clinical observations, patient history, and epidemiological information. The expected result is Negative.  Fact Sheet for Patients:  BloggerCourse.com  Fact Sheet for Healthcare Providers:  SeriousBroker.it  This test is no t yet approved or cleared by the [place] FDA and  has been authorized for detection and/or diagnosis of SARS-CoV-2 by FDA under an Emergency Use Authorization (EUA). This EUA will remain  in effect  (meaning this test can be used) for the duration of the COVID-19 declaration under Section 564(b)(1) of the Act, 21 U.S.C.section 360bbb-3(b)(1), unless the authorization is terminated  or revoked sooner.       Influenza A by PCR NEGATIVE NEGATIVE Final   Influenza B by PCR NEGATIVE NEGATIVE Final    Comment: (NOTE) The Xpert Xpress SARS-CoV-2/FLU/RSV plus assay is intended as an aid in the diagnosis of influenza from Nasopharyngeal swab specimens and should not be used as a sole basis for treatment. Nasal washings and aspirates are unacceptable for Xpert Xpress SARS-CoV-2/FLU/RSV testing.  Fact Sheet for Patients: BloggerCourse.com  Fact Sheet for Healthcare Providers: SeriousBroker.it  This test is not yet approved or cleared by the [place] FDA and has been authorized for detection and/or diagnosis of SARS-CoV-2 by FDA under an Emergency Use Authorization (EUA). This EUA will remain in effect (meaning this test can be used) for the duration of the COVID-19 declaration under Section 564(b)(1) of the Act, 21 U.S.C. section 360bbb-3(b)(1), unless the authorization is terminated or revoked.  Performed at [facility], [address], [place], [place] [address]   MRSA PCR Screening     Status: None   Collection Time: 04/19/20  4:05 PM   Specimen: Nasal Mucosa; Nasopharyngeal  Result Value Ref Range Status   MRSA by PCR NEGATIVE NEGATIVE Final    Comment:        The GeneXpert MRSA Assay (FDA approved for NASAL specimens only), is one component of a comprehensive MRSA colonization surveillance program. It is not intended to diagnose MRSA infection nor to guide or monitor treatment for MRSA infections. Performed at [facility], [address], [place], [place] [address]      Labs: Basic Metabolic Panel: Recent Labs  Lab 04/20/20 0502 04/20/20 0502 04/21/20 0457 04/21/20 1348 04/22/20 0430 04/23/20 0801  04/24/20 0412  NA 140   < > 141 139 140 139 138  K 3.5   < > 2.7* 3.9 3.6 3.2* 4.0  CL 103   < > 101 104 104 104 107  CO2 29   < > GLUCOSE 144*   < > 102* 113* 92 90 95  BUN 22   < > 25* 25* 24* 18 17  CREATININE 0.56   < > 0.63 0.65 0.70 0.60 0.65  CALCIUM 8.9   < > 8.9 8.6* 8.9 8.6* 8.9  MG 2.0  --  1.9  --  1.9 1.8 1.9  PHOS 3.5  --   --   --   --   --   --    < > = values in this interval not displayed.   Liver Function Tests: Recent Labs  Lab 04/19/20 0255 04/20/20 0502  AST 31 26  ALT 30 32  ALKPHOS 87 72  BILITOT 0.8 0.8  PROT 7.2 6.0*  ALBUMIN 3.3* 2.9*   CBC: Recent Labs  Lab 04/19/20 0255 04/20/20 0502 04/20/20 1330 04/21/20 0457 04/22/20 0430  WBC 9.4 7.2  --  10.6* 9.3  NEUTROABS 7.0  --   --   --   --  HGB 8.8* 7.5* 8.6* 8.0* 8.4*  HCT 27.1* 23.6* 26.2* 25.4* 26.5*  MCV 91.9 91.1  --  93.7 92.7  PLT 146* 140*  --  150 165   BNP (last 3 results) Recent Labs    04/19/20 0255  BNP 1,081.0*     Signed:  Vassie Loll MD.  [facility] 04/24/2020, 3:41 PM

## 2020-05-07 ENCOUNTER — Ambulatory Visit: Payer: Self-pay | Admitting: Student

## 2020-05-07 NOTE — Progress Notes (Deleted)
Cardiology Office Note    Date:  05/07/2020   ID:  Sarah Bonilla, DOB 1955/07/17, MRN [id number]  PCP:  Patient, No Pcp Per  Cardiologist: Dina Rich, MD    No chief complaint on file.   History of Present Illness:    Sarah Bonilla is a 64 y.o. female with past medical history of HTN and tobacco use who presents to the office today for hospital follow-up.  She was recently admitted to [facility] last month for evaluation of worsening dyspnea and a nonproductive cough.  She reported having been diagnosed with hypertension in the past but not been on medications for 10+ years. She was found to have hypertensive emergency as BP was elevated to 230/107 upon admission and also had a CHF exacerbation with BNP elevated to 1081.  She responded well to IV Lasix as volume status quickly improved.  Her echocardiogram showed a preserved EF of 55 to 60% with no regional wall motion abnormalities.  She did have moderate LVH and grade 3 diastolic dysfunction and severely elevated PASP. She was discharged on Amlodipine 10 mg daily, Coreg 25 mg twice daily, Hydralazine 75 mg TID and Lisinopril 40 mg daily  -PFTs as an outpatient  Past Medical History:  Diagnosis Date  . Hypertension     Past Surgical History:  Procedure Laterality Date  . CHOLECYSTECTOMY      Current Medications: Outpatient Medications Prior to Visit  Medication Sig Dispense Refill  . acetaminophen (TYLENOL) 325 MG tablet Take 650 mg by mouth every 6 (six) hours as needed for moderate pain or headache.    Marland Kitchen amLODipine (NORVASC) 10 MG tablet Take 1 tablet (10 mg total) by mouth daily. 30 tablet 2  . carvedilol (COREG) 25 MG tablet Take 1 tablet (25 mg total) by mouth 2 (two) times daily with a meal. 60 tablet 2  . hydrALAZINE (APRESOLINE) 25 MG tablet Take 3 tablets (75 mg total) by mouth every 8 (eight) hours. 90 tablet 2  . lisinopril (ZESTRIL) 40 MG tablet Take 1 tablet (40 mg total) by mouth daily. 30 tablet 2   . pantoprazole (PROTONIX) 40 MG tablet Take 1 tablet (40 mg total) by mouth daily. 30 tablet 1   No facility-administered medications prior to visit.     Allergies:   Patient has no known allergies.   Social History   Socioeconomic History  . Marital status: Widowed    Spouse name: Not on file  . Number of children: Not on file  . Years of education: Not on file  . Highest education level: Not on file  Occupational History  . Not on file  Tobacco Use  . Smoking status: Current Every Day Smoker    Packs/day: 1.00    Types: Cigarettes  . Smokeless tobacco: Never Used  Vaping Use  . Vaping Use: Never used  Substance and Sexual Activity  . Alcohol use: Yes    Comment: occasionally   . Drug use: Never  . Sexual activity: Not on file  Other Topics Concern  . Not on file  Social History Narrative  . Not on file   Social Determinants of Health   Financial Resource Strain:   . Difficulty of Paying Living Expenses: Not on file  Food Insecurity:   . Worried About Programme researcher, broadcasting/film/video in the Last Year: Not on file  . Ran Out of Food in the Last Year: Not on file  Transportation Needs:   . Lack of Transportation (  Medical): Not on file  . Lack of Transportation (Non-Medical): Not on file  Physical Activity:   . Days of Exercise per Week: Not on file  . Minutes of Exercise per Session: Not on file  Stress:   . Feeling of Stress : Not on file  Social Connections:   . Frequency of Communication with Friends and Family: Not on file  . Frequency of Social Gatherings with Friends and Family: Not on file  . Attends Religious Services: Not on file  . Active Member of Clubs or Organizations: Not on file  . Attends Banker Meetings: Not on file  . Marital Status: Not on file     Family History:  The patient's ***family history includes CVA in her mother; Hypertension in her father.   Review of Systems:   Please see the history of present illness.     General:  No  chills, fever, night sweats or weight changes.  Cardiovascular:  No chest pain, dyspnea on exertion, edema, orthopnea, palpitations, paroxysmal nocturnal dyspnea. Dermatological: No rash, lesions/masses Respiratory: No cough, dyspnea Urologic: No hematuria, dysuria Abdominal:   No nausea, vomiting, diarrhea, bright red blood per rectum, melena, or hematemesis Neurologic:  No visual changes, wkns, changes in mental status. All other systems reviewed and are otherwise negative except as noted above.   Physical Exam:    VS:  There were no vitals taken for this visit.   General: Well developed, well nourished,female appearing in no acute distress. Head: Normocephalic, atraumatic. Neck: No carotid bruits. JVD not elevated.  Lungs: Respirations regular and unlabored, without wheezes or rales.  Heart: ***Regular rate and rhythm. No S3 or S4.  No murmur, no rubs, or gallops appreciated. Abdomen: Appears non-distended. No obvious abdominal masses. Msk:  Strength and tone appear normal for age. No obvious joint deformities or effusions. Extremities: No clubbing or cyanosis. No edema.  Distal pedal pulses are 2+ bilaterally. Neuro: Alert and oriented X 3. Moves all extremities spontaneously. No focal deficits noted. Psych:  Responds to questions appropriately with a normal affect. Skin: No rashes or lesions noted  Wt Readings from Last 3 Encounters:  04/23/20 88 lb 2.9 oz (40 kg)  12/20/17 107 lb (48.5 kg)        Studies/Labs Reviewed:   EKG:  EKG is*** ordered today.  The ekg ordered today demonstrates ***  Recent Labs: 04/19/2020: B Natriuretic Peptide 1,081.0 04/20/2020: ALT 32 04/22/2020: Hemoglobin 8.4; Platelets 165 04/24/2020: BUN 17; Creatinine, Ser 0.65; Magnesium 1.9; Potassium 4.0; Sodium 138   Lipid Panel No results found for: CHOL, TRIG, HDL, CHOLHDL, VLDL, LDLCALC, LDLDIRECT  Additional studies/ records that were reviewed today include:   Echocardiogram:  04/19/2020 IMPRESSIONS    1. Left ventricular ejection fraction, by estimation, is 55 to 60%. The  left ventricle has normal function. The left ventricle has no regional  wall motion abnormalities. There is moderate left ventricular hypertrophy.  Left ventricular diastolic  parameters are consistent with Grade III diastolic dysfunction  (restrictive). Elevated left atrial pressure.  2. Right ventricular systolic function is normal. The right ventricular  size is mildly enlarged. There is severely elevated pulmonary artery  systolic pressure.  3. Left atrial size was severely dilated.  4. Right atrial size was severely dilated.  5. The mitral valve is normal in structure. Mild mitral valve  regurgitation. No evidence of mitral stenosis.  6. The aortic valve is tricuspid. Aortic valve regurgitation is not  visualized. No aortic stenosis is present.  7.  The inferior vena cava is dilated in size with <50% respiratory  variability, suggesting right atrial pressure of 15 mmHg.   Assessment:    No diagnosis found.   Plan:   In order of problems listed above:  1. ***    Shared Decision Making/Informed Consent:   {Are you ordering a CV Procedure (e.g. stress test, cath, DCCV, TEE, etc)?   Press F2        :[id number]}    Medication Adjustments/Labs and Tests Ordered: Current medicines are reviewed at length with the patient today.  Concerns regarding medicines are outlined above.  Medication changes, Labs and Tests ordered today are listed in the Patient Instructions below. There are no Patient Instructions on file for this visit.   Signed, Ellsworth Lennox, PA-C  05/07/2020 12:11 PM    [facility] [address] [place], [place] [address] Phone: [phone] Fax: [phone]

## 2020-08-10 ENCOUNTER — Emergency Department (HOSPITAL_COMMUNITY): Payer: Self-pay

## 2020-08-10 ENCOUNTER — Other Ambulatory Visit: Payer: Self-pay

## 2020-08-10 ENCOUNTER — Encounter (HOSPITAL_COMMUNITY): Payer: Self-pay | Admitting: Emergency Medicine

## 2020-08-10 ENCOUNTER — Inpatient Hospital Stay (HOSPITAL_COMMUNITY)
Admission: EM | Admit: 2020-08-10 | Discharge: 2020-08-21 | DRG: 252 | Disposition: A | Discharge status: Home / Self Care | Payer: Self-pay | Attending: Family Medicine | Admitting: Family Medicine

## 2020-08-10 DIAGNOSIS — Z8249 Family history of ischemic heart disease and other diseases of the circulatory system: Secondary | ICD-10-CM

## 2020-08-10 DIAGNOSIS — I4589 Other specified conduction disorders: Secondary | ICD-10-CM | POA: Diagnosis present

## 2020-08-10 DIAGNOSIS — M8580 Other specified disorders of bone density and structure, unspecified site: Secondary | ICD-10-CM | POA: Diagnosis present

## 2020-08-10 DIAGNOSIS — I110 Hypertensive heart disease with heart failure: Secondary | ICD-10-CM | POA: Diagnosis present

## 2020-08-10 DIAGNOSIS — Z20822 Contact with and (suspected) exposure to covid-19: Secondary | ICD-10-CM | POA: Diagnosis present

## 2020-08-10 DIAGNOSIS — D638 Anemia in other chronic diseases classified elsewhere: Secondary | ICD-10-CM | POA: Diagnosis present

## 2020-08-10 DIAGNOSIS — I4892 Unspecified atrial flutter: Secondary | ICD-10-CM | POA: Diagnosis present

## 2020-08-10 DIAGNOSIS — Z01810 Encounter for preprocedural cardiovascular examination: Secondary | ICD-10-CM

## 2020-08-10 DIAGNOSIS — I70262 Atherosclerosis of native arteries of extremities with gangrene, left leg: Secondary | ICD-10-CM | POA: Diagnosis present

## 2020-08-10 DIAGNOSIS — Z9111 Patient's noncompliance with dietary regimen: Secondary | ICD-10-CM

## 2020-08-10 DIAGNOSIS — I82502 Chronic embolism and thrombosis of unspecified deep veins of left lower extremity: Secondary | ICD-10-CM | POA: Diagnosis present

## 2020-08-10 DIAGNOSIS — I10 Essential (primary) hypertension: Secondary | ICD-10-CM

## 2020-08-10 DIAGNOSIS — E1152 Type 2 diabetes mellitus with diabetic peripheral angiopathy with gangrene: Principal | ICD-10-CM | POA: Diagnosis present

## 2020-08-10 DIAGNOSIS — L97529 Non-pressure chronic ulcer of other part of left foot with unspecified severity: Secondary | ICD-10-CM

## 2020-08-10 DIAGNOSIS — E1169 Type 2 diabetes mellitus with other specified complication: Secondary | ICD-10-CM | POA: Diagnosis present

## 2020-08-10 DIAGNOSIS — I2510 Atherosclerotic heart disease of native coronary artery without angina pectoris: Secondary | ICD-10-CM | POA: Diagnosis present

## 2020-08-10 DIAGNOSIS — Z9114 Patient's other noncompliance with medication regimen: Secondary | ICD-10-CM

## 2020-08-10 DIAGNOSIS — E2609 Other primary hyperaldosteronism: Secondary | ICD-10-CM | POA: Diagnosis present

## 2020-08-10 DIAGNOSIS — Z79899 Other long term (current) drug therapy: Secondary | ICD-10-CM

## 2020-08-10 DIAGNOSIS — F17210 Nicotine dependence, cigarettes, uncomplicated: Secondary | ICD-10-CM | POA: Diagnosis present

## 2020-08-10 DIAGNOSIS — I4891 Unspecified atrial fibrillation: Secondary | ICD-10-CM | POA: Diagnosis present

## 2020-08-10 DIAGNOSIS — E876 Hypokalemia: Secondary | ICD-10-CM | POA: Diagnosis present

## 2020-08-10 DIAGNOSIS — I96 Gangrene, not elsewhere classified: Secondary | ICD-10-CM | POA: Diagnosis present

## 2020-08-10 DIAGNOSIS — L03032 Cellulitis of left toe: Secondary | ICD-10-CM | POA: Diagnosis present

## 2020-08-10 DIAGNOSIS — J449 Chronic obstructive pulmonary disease, unspecified: Secondary | ICD-10-CM | POA: Diagnosis present

## 2020-08-10 DIAGNOSIS — I161 Hypertensive emergency: Secondary | ICD-10-CM | POA: Diagnosis present

## 2020-08-10 DIAGNOSIS — I5033 Acute on chronic diastolic (congestive) heart failure: Secondary | ICD-10-CM | POA: Diagnosis present

## 2020-08-10 DIAGNOSIS — Z681 Body mass index (BMI) 19 or less, adult: Secondary | ICD-10-CM

## 2020-08-10 DIAGNOSIS — R636 Underweight: Secondary | ICD-10-CM | POA: Diagnosis present

## 2020-08-10 LAB — BASIC METABOLIC PANEL
Anion gap: 11 (ref 5–15)
BUN: 23 mg/dL (ref 8–23)
CO2: 25 mmol/L (ref 22–32)
Calcium: 9.2 mg/dL (ref 8.9–10.3)
Chloride: 106 mmol/L (ref 98–111)
Creatinine, Ser: 0.7 mg/dL (ref 0.44–1.00)
GFR, Estimated: >60 mL/min (ref >60)
Glucose, Bld: 105 mg/dL — ABNORMAL HIGH (ref 70–99)
Potassium: 2.4 mmol/L — CL (ref 3.5–5.1)
Sodium: 142 mmol/L (ref 135–145)

## 2020-08-10 LAB — LACTIC ACID, PLASMA: Lactic Acid, Venous: 1.1 mmol/L (ref 0.5–1.9)

## 2020-08-10 LAB — CBC
HCT: 30.5 % — ABNORMAL LOW (ref 36.0–46.0)
Hemoglobin: 10 g/dL — ABNORMAL LOW (ref 12.0–15.0)
MCH: 30.1 pg (ref 26.0–34.0)
MCHC: 32.8 g/dL (ref 30.0–36.0)
MCV: 91.9 fL (ref 80.0–100.0)
Platelets: 183 10*3/uL (ref 150–400)
RBC: 3.32 MIL/uL — ABNORMAL LOW (ref 3.87–5.11)
RDW: 15.5 % (ref 11.5–15.5)
WBC: 7.3 10*3/uL (ref 4.0–10.5)
nRBC: 0 % (ref 0.0–0.2)

## 2020-08-10 LAB — SEDIMENTATION RATE: Sed Rate: 25 mm/hr — ABNORMAL HIGH (ref 0–22)

## 2020-08-10 LAB — C-REACTIVE PROTEIN: CRP: 1 mg/dL — ABNORMAL HIGH (ref <1.0)

## 2020-08-10 MED ORDER — HYDRALAZINE HCL 20 MG/ML IJ SOLN
10.0000 mg | Freq: Once | INTRAMUSCULAR | Status: AC
  Administered 2020-08-11: 10 mg via INTRAVENOUS
  Filled 2020-08-10: qty 1

## 2020-08-10 MED ORDER — NITROGLYCERIN IN D5W 200-5 MCG/ML-% IV SOLN
5.0000 ug/min | INTRAVENOUS | Status: DC
  Administered 2020-08-11: 15 ug/min via INTRAVENOUS
  Filled 2020-08-10: qty 250

## 2020-08-10 MED ORDER — POTASSIUM CHLORIDE 10 MEQ/100ML IV SOLN
10.0000 meq | INTRAVENOUS | Status: AC
  Administered 2020-08-11 (×3): 10 meq via INTRAVENOUS
  Filled 2020-08-10 (×3): qty 100

## 2020-08-10 MED ORDER — FUROSEMIDE 10 MG/ML IJ SOLN
40.0000 mg | Freq: Once | INTRAMUSCULAR | Status: AC
  Administered 2020-08-11: 40 mg via INTRAVENOUS
  Filled 2020-08-10: qty 4

## 2020-08-10 MED ORDER — POTASSIUM CHLORIDE CRYS ER 20 MEQ PO TBCR
40.0000 meq | EXTENDED_RELEASE_TABLET | Freq: Once | ORAL | Status: AC
  Administered 2020-08-11: 40 meq via ORAL
  Filled 2020-08-10: qty 2

## 2020-08-10 MED ORDER — SODIUM CHLORIDE 0.9 % IV SOLN
1.0000 g | Freq: Once | INTRAVENOUS | Status: AC
  Administered 2020-08-11: 1 g via INTRAVENOUS
  Filled 2020-08-10: qty 10

## 2020-08-10 NOTE — ED Notes (Signed)
Pt states she took her BP medication today, however she is still hypertensive.

## 2020-08-10 NOTE — ED Provider Notes (Addendum)
[facility]ENCY [facility]PART[facility] Provider Note   CSN: [id number] Arrival date & time: 08/10/20  1924     History No chief complaint on file.   Sarah Bonilla is a 65 y.o. female.  Patient is a poor historian.  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  She states she injured her left great toe about 1 month ago when she stubbed it.  Since then she has had increasing pain and swelling to her left toe and left leg.  Over the past 1 day she has had pain of swelling of the right leg as well.  She is had increasing shortness of breath with some dry cough.  Denies any fever.  Denies any drainage from her toe.  No one has seen it before today.  She is not diabetic. She comes in today because of worsening leg pain or leg swelling and increased shortness of breath. States she missed her blood pressure medication today but has otherwise been taking it. She does not seem to know what her medications are or what her medical problems are.  Does not think that she is diabetic  The history is provided by the patient.  States     Past Medical History:  Diagnosis Date  . Hypertension     Patient Active Problem List   Diagnosis Date Noted  . Acute pulmonary edema (HCC)   . Leg swelling   . Acute CHF (congestive heart failure) (HCC) 04/19/2020  . COPD (chronic obstructive pulmonary disease) (HCC) 04/19/2020  . Essential hypertension 04/19/2020  . Hypertensive emergency 04/19/2020  . Hypokalemia 04/19/2020  . Localized swelling of left lower extremity 04/19/2020  . Hypoalbuminemia 04/19/2020  . Elevated brain natriuretic peptide (BNP) level 04/19/2020  . Elevated troponin I level 04/19/2020  . Thrombocytopenia (HCC) 04/19/2020  . Tobacco abuse 04/19/2020  . Noncompliance with medication regimen 04/19/2020    Past Surgical History:  Procedure Laterality Date  . CHOLECYSTECTOMY       OB History   No  obstetric history on file.     Family History  Problem Relation Age of Onset  . CVA Mother   . Hypertension Father     Social History   Tobacco Use  . Smoking status: Current Every Day Smoker    Packs/day: 1.00    Types: Cigarettes  . Smokeless tobacco: Never Used  Vaping Use  . Vaping Use: Never used  Substance Use Topics  . Alcohol use: Yes    Comment: occasionally   . Drug use: Never    Home Medications Prior to Admission medications   Medication Sig Start Date End Date Taking? Authorizing Provider  acetaminophen (TYLENOL) 325 MG tablet Take 650 mg by mouth every 6 (six) hours as needed for moderate pain or headache.    [provider]  amLODipine (NORVASC) 10 MG tablet Take 1 tablet (10 mg total) by mouth daily. 04/25/20   Vassie LollMadera, Carlos, MD  carvedilol (COREG) 25 MG tablet Take 1 tablet (25 mg total) by mouth 2 (two) times daily with a meal. 04/24/20   Vassie LollMadera, Carlos, MD  hydrALAZINE (APRESOLINE) 25 MG tablet Take 3 tablets (75 mg total) by mouth every 8 (eight) hours. 04/24/20   Vassie LollMadera, Carlos, MD  lisinopril (ZESTRIL) 40 MG tablet Take 1 tablet (40 mg total) by mouth daily. 04/25/20   Vassie LollMadera, Carlos, MD  pantoprazole (PROTONIX) 40 MG tablet Take 1 tablet (40 mg total) by  mouth daily. 04/25/20   Vassie Loll, MD    Allergies    Patient has no known allergies.  Review of Systems   Review of Systems  Constitutional: Negative for activity change, appetite change and fatigue.  HENT: Negative for congestion and rhinorrhea.   Respiratory: Positive for cough and shortness of breath.   Cardiovascular: Positive for leg swelling. Negative for chest pain.  Gastrointestinal: Negative for abdominal pain, nausea and vomiting.  Genitourinary: Negative for dysuria and hematuria.  Musculoskeletal: Positive for arthralgias and myalgias.  Neurological: Negative for headaches.   all other systems are negative except as noted in the HPI and PMH.   Physical Exam Updated  Vital Signs BP (!) 228/123   Pulse 83   Temp 98.3 F (36.8 C) (Oral)   Resp 16   Ht 5\' 2"  (1.575 m)   Wt 44.7 kg   SpO2 94%   BMI 18.02 kg/m   Physical Exam Vitals and nursing note reviewed.  Constitutional:      General: She is not in acute distress.    Appearance: She is well-developed. She is not ill-appearing.     Comments: Speaking in full sentences, no respiratory distress.  HENT:     Head: Normocephalic and atraumatic.     Mouth/Throat:     Pharynx: No oropharyngeal exudate.  Eyes:     Conjunctiva/sclera: Conjunctivae normal.     Pupils: Pupils are equal, round, and reactive to light.  Neck:     Comments: No meningismus. Cardiovascular:     Rate and Rhythm: Normal rate and regular rhythm.     Heart sounds: Normal heart sounds. No murmur heard.   Pulmonary:     Effort: Pulmonary effort is normal. No respiratory distress.     Breath sounds: Rales present.  Abdominal:     Palpations: Abdomen is soft.     Tenderness: There is no abdominal tenderness. There is no guarding or rebound.  Musculoskeletal:        General: Swelling and tenderness present. Normal range of motion.     Cervical back: Normal range of motion and neck supple.     Right lower leg: Edema present.     Left lower leg: Edema present.     Comments: +2 edema bilaterally.  +1 pulses present with Doppler.  DP and PT.  Necrotic foul-smelling wound involving left greater toe as depicted.  Skin:    General: Skin is warm.  Neurological:     Mental Status: She is alert and oriented to person, place, and time.     Cranial Nerves: No cranial nerve deficit.     Motor: No abnormal muscle tone.     Coordination: Coordination normal.     Comments:  5/5 strength throughout. CN 2-12 intact.Equal grip strength.   Psychiatric:        Behavior: Behavior normal.       ED Results / Procedures / Treatments   Labs (all labs ordered are listed, but only abnormal results are displayed) Labs Reviewed  BASIC  METABOLIC PANEL - Abnormal; Notable for the following components:      Result Value   Potassium 2.4 (*)    Glucose, Bld 105 (*)    All other components within normal limits  CBC - Abnormal; Notable for the following components:   RBC 3.32 (*)    Hemoglobin 10.0 (*)    HCT 30.5 (*)    All other components within normal limits  SEDIMENTATION RATE - Abnormal; Notable for the  following components:   Sed Rate 25 (*)    All other components within normal limits  C-REACTIVE PROTEIN - Abnormal; Notable for the following components:   CRP 1.0 (*)    All other components within normal limits  D-DIMER, QUANTITATIVE - Abnormal; Notable for the following components:   D-Dimer, Quant 0.70 (*)    All other components within normal limits  BRAIN NATRIURETIC PEPTIDE - Abnormal; Notable for the following components:   B Natriuretic Peptide 2,259.0 (*)    All other components within normal limits  CULTURE, BLOOD (ROUTINE X 2)  RESP PANEL BY RT-PCR (FLU A&B, COVID) ARPGX2  CULTURE, BLOOD (ROUTINE X 2)  LACTIC ACID, PLASMA  LACTIC ACID, PLASMA  MAGNESIUM    EKG EKG Interpretation  Date/Time:  Sunday August 11 2020 00:05:17 EST Ventricular Rate:  89 PR Interval:    QRS Duration: 77 QT Interval:  456 QTC Calculation: 555 R Axis:   58 Text Interpretation: Sinus rhythm Borderline short PR interval LVH with secondary repolarization abnormality Prolonged QT interval Interpretation limited secondary to artifact Confirmed by Glynn Octave [phone]) on 08/11/2020 12:21:05 AM   Radiology CT Angio Chest PE W and/or Wo Contrast  Result Date: 08/11/2020 CLINICAL DATA:  Pulmonary embolus suspected. Swelling left lower extremity. Injured for months. EXAM: CT ANGIOGRAPHY CHEST WITH CONTRAST TECHNIQUE: Multidetector CT imaging of the chest was performed using the standard protocol during bolus administration of intravenous contrast. Multiplanar CT image reconstructions and MIPs were obtained to evaluate the  vascular anatomy. CONTRAST:  OMNIPAQUE IOHEXOL 350 MG/ML SOLN COMPARISON:  Chest x-ray 08/10/2020 FINDINGS: Cardiovascular: Satisfactory opacification of the pulmonary arteries to the segmental level. No evidence of pulmonary embolism. The main pulmonary artery is normal in caliber. Mild atherosclerotic plaque of the thoracic aorta. severe left anterior descending left circumflex as well as at least mild right main coronary artery calcifications. Mediastinum/Nodes: No enlarged mediastinal, hilar, or axillary lymph nodes. Thyroid gland, trachea, and esophagus demonstrate no significant findings. Lungs/Pleura: Centrilobular emphysematous changes. Bilateral lower lobe subsegmental atelectasis. Limited evaluation for pulmonary nodularity due to motion artifact. No pulmonary mass. No focal consolidation. No pleural effusion. No pneumothorax. Upper Abdomen: Status post cholecystectomy. Atherosclerotic plaque. No acute abnormality. Musculoskeletal: No chest wall abnormality. Likely bone island of the left posterior eighth rib. No suspicious lytic or blastic osseous lesions. No acute displaced fracture. Review of the MIP images confirms the above findings. IMPRESSION: 1. No central or segmental pulmonary embolus. Limited evaluation at the subsegmental level due to motion artifact. 2. No acute intrapulmonary abnormality. 3. Aortic Atherosclerosis (ICD10-I70.0) including 3 vessel mild to severe coronary artery calcification. 4.  Emphysema (ICD10-J43.9). Electronically Signed   By: Tish Frederickson M.D.   On: 08/11/2020 04:06   DG Chest Portable 1 View  Result Date: 08/10/2020 CLINICAL DATA:  Hypoxia EXAM: PORTABLE CHEST 1 VIEW COMPARISON:  04/19/2020 FINDINGS: The lungs are hyperinflated with diffuse interstitial prominence. No focal airspace consolidation or pulmonary edema. No pleural effusion or pneumothorax. Normal cardiomediastinal contours. IMPRESSION: COPD without acute airspace disease. Electronically Signed    By: Deatra Robinson M.D.   On: 08/10/2020 23:58   DG Foot Complete Left  Result Date: 08/10/2020 CLINICAL DATA:  Left great toe swelling. EXAM: LEFT FOOT - COMPLETE 3+ VIEW COMPARISON:  None. FINDINGS: There is no evidence of fracture or dislocation. There is diffuse osteopenia with mild degenerative changes seen along the dorsal aspect of the proximal to mid left foot. Marked severity soft tissue swelling is seen throughout the  left great toe. IMPRESSION: Marked severity soft tissue swelling throughout the left great toe without evidence of an acute osseous abnormality. Electronically Signed   By: Aram Candela M.D.   On: 08/10/2020 22:10    Procedures .Critical Care Performed by: Glynn Octave, MD Authorized by: Glynn Octave, MD   Critical care provider statement:    Critical care time (minutes):  60   Critical care was necessary to treat or prevent imminent or life-threatening deterioration of the following conditions:  Cardiac failure and metabolic crisis (hypertensive emergency)   Critical care was time spent personally by me on the following activities:  Discussions with consultants, evaluation of patient's response to treatment, examination of patient, ordering and performing treatments and interventions, ordering and review of laboratory studies, ordering and review of radiographic studies, pulse oximetry, re-evaluation of patient's condition, obtaining history from patient or surrogate and review of old charts     Medications Ordered in ED Medications  cefTRIAXone (ROCEPHIN) 1 g in sodium chloride 0.9 % 100 mL IVPB (has no administration in time range)  hydrALAZINE (APRESOLINE) injection 10 mg (has no administration in time range)  potassium chloride SA (KLOR-CON) CR tablet 40 mEq (has no administration in time range)  potassium chloride 10 mEq in 100 mL IVPB (has no administration in time range)  furosemide (LASIX) injection 40 mg (has no administration in time range)   nitroGLYCERIN 50 mg in dextrose 5 % 250 mL (0.2 mg/mL) infusion (has no administration in time range)    ED Course  I have reviewed the triage vital signs and the nursing notes.  Pertinent labs & imaging results that were available during my care of the patient were reviewed by me and considered in my medical decision making (see chart for details) . -2D echo demonstrated ejection fraction of 55 to 60%; moderate LVH and grade 3 diastolic dysfunction   MDM Rules/Calculators/A&P                         Patient here with leg swelling as well as wound to her left great toe for the past 1 month.  She appears to be in acute CHF with hypertensive emergency.  She is hypoxic and hypertensive with crackles and leg swelling and edema.  Necrotic wound to her toe as depicted.  Will start on antibiotics.  She does have pulses dopplerable bilaterally. X-ray shows no evidence of osteomyelitis.  Labs show hypokalemia which is replaced.  Anticipate this will worsen with Lasix.  Appears to have hypertensive emergency with likely diastolic CHF causing leg swelling.  She has no obvious edema on her chest x-ray.  Patient given IV Lasix, IV hydralazine and IV nitroglycerin drip. Given antibiotics for toe infection.  She will need admission given her uncontrolled blood pressure and hypoxia.  She is stable on 6 L nasal cannula.  Patient transition to high flow nasal cannula.  She started on a nitroglycerin drip for her hypertensive emergency.  Her chest x-ray is suspiciously clear so it is unclear why her oxygen requirement is so high. She does have a history of chronic DVT but is not anticoagulated.  We will obtain CT angiogram to rule out pulmonary embolism.  Admission discussed with Dr. Thomes Dinning.  CTA is negative for pulmonary embolism. Shows coronary calcifications but no significant pulmonary edema.  Blood pressure remains uncontrolled in the 220 systolic range despite 100 mcg of  nitroglycerin. Transition to nicardipine. Dr/ Thomes Dinning updated.  Addendum: Around 7 AM, patient  went into atrial flutter rates 140s to 150s.  Blood pressure is improved to the 160 systolic on nicardipine drip.  No history of atrial flutter.  Will initiate IV Cardizem bolus and drip. Eliquis initiated.  Hypokalemia of 2.5 persists on recheck and additional potassium ordered.      This patients CHA2DS2-VASc Score and unadjusted Ischemic Stroke Rate (% per year) is equal to 3.2 % stroke rate/year from a score of 3  Above score calculated as 1 point each if present [CHF, HTN, DM, Vascular=MI/PAD/Aortic Plaque, Age if 65-74, or Female] Above score calculated as 2 points each if present [Age > 75, or Stroke/TIA/TE]   Final Clinical Impression(s) / ED Diagnoses Final diagnoses:  Hypertensive emergency  Acute on chronic diastolic congestive heart failure [facility])    Rx / DC Orders ED Discharge Orders    None       Karlene Southard, Jeannett Senior, MD 08/11/20 0541    Glynn Octave, MD 08/11/20 [phone]

## 2020-08-10 NOTE — ED Triage Notes (Signed)
Pt to the Ed with swelling to her Left greater toe. The toe appears to be infected and the patient is c/o left leg swelling.  Pt states the toe has been injured for a month.

## 2020-08-10 NOTE — ED Notes (Addendum)
Date and time results received: 08/10/20 2310   Test: K+ Critical Value: 2.4  Name of Provider Notified: Bernette Mayers, MD

## 2020-08-11 ENCOUNTER — Inpatient Hospital Stay (HOSPITAL_COMMUNITY): Payer: Self-pay

## 2020-08-11 ENCOUNTER — Other Ambulatory Visit: Payer: Self-pay

## 2020-08-11 ENCOUNTER — Emergency Department (HOSPITAL_COMMUNITY): Payer: Self-pay

## 2020-08-11 DIAGNOSIS — I96 Gangrene, not elsewhere classified: Secondary | ICD-10-CM

## 2020-08-11 DIAGNOSIS — L97529 Non-pressure chronic ulcer of other part of left foot with unspecified severity: Secondary | ICD-10-CM

## 2020-08-11 LAB — D-DIMER, QUANTITATIVE: D-Dimer, Quant: 0.7 ug/mL-FEU — ABNORMAL HIGH (ref 0.00–0.50)

## 2020-08-11 LAB — BASIC METABOLIC PANEL
Anion gap: 12 (ref 5–15)
BUN: 18 mg/dL (ref 8–23)
CO2: 29 mmol/L (ref 22–32)
Calcium: 9 mg/dL (ref 8.9–10.3)
Chloride: 101 mmol/L (ref 98–111)
Creatinine, Ser: 0.51 mg/dL (ref 0.44–1.00)
GFR, Estimated: >60 mL/min (ref >60)
Glucose, Bld: 102 mg/dL — ABNORMAL HIGH (ref 70–99)
Potassium: 2.5 mmol/L — CL (ref 3.5–5.1)
Sodium: 142 mmol/L (ref 135–145)

## 2020-08-11 LAB — RAPID URINE DRUG SCREEN, HOSP PERFORMED
Amphetamines: NOT DETECTED
Barbiturates: NOT DETECTED
Benzodiazepines: NOT DETECTED
Cocaine: NOT DETECTED
Opiates: NOT DETECTED
Tetrahydrocannabinol: NOT DETECTED

## 2020-08-11 LAB — RESP PANEL BY RT-PCR (FLU A&B, COVID) ARPGX2
Influenza A by PCR: NEGATIVE
Influenza B by PCR: NEGATIVE
SARS Coronavirus 2 by RT PCR: NEGATIVE

## 2020-08-11 LAB — BRAIN NATRIURETIC PEPTIDE: B Natriuretic Peptide: 2259 pg/mL — ABNORMAL HIGH (ref 0.0–100.0)

## 2020-08-11 LAB — HEPARIN LEVEL (UNFRACTIONATED)
Heparin Unfractionated: <0.1 IU/mL — ABNORMAL LOW (ref 0.30–0.70)
Heparin Unfractionated: 0.21 IU/mL — ABNORMAL LOW (ref 0.30–0.70)

## 2020-08-11 LAB — MRSA PCR SCREENING: MRSA by PCR: NEGATIVE

## 2020-08-11 LAB — LACTIC ACID, PLASMA: Lactic Acid, Venous: 1.5 mmol/L (ref 0.5–1.9)

## 2020-08-11 LAB — HEMOGLOBIN A1C
Hgb A1c MFr Bld: 5.1 % (ref 4.8–5.6)
Mean Plasma Glucose: 99.67 mg/dL

## 2020-08-11 LAB — MAGNESIUM: Magnesium: 1.7 mg/dL (ref 1.7–2.4)

## 2020-08-11 LAB — TSH: TSH: <0.01 u[IU]/mL — ABNORMAL LOW (ref 0.350–4.500)

## 2020-08-11 MED ORDER — HEPARIN (PORCINE) 25000 UT/250ML-% IV SOLN
1400.0000 [IU]/h | INTRAVENOUS | Status: DC
  Administered 2020-08-11: 700 [IU]/h via INTRAVENOUS
  Administered 2020-08-12 – 2020-08-13 (×2): 1100 [IU]/h via INTRAVENOUS
  Administered 2020-08-14: 1250 [IU]/h via INTRAVENOUS
  Administered 2020-08-14 – 2020-08-15 (×2): 1300 [IU]/h via INTRAVENOUS
  Filled 2020-08-11 (×8): qty 250

## 2020-08-11 MED ORDER — POTASSIUM CHLORIDE CRYS ER 20 MEQ PO TBCR
40.0000 meq | EXTENDED_RELEASE_TABLET | Freq: Once | ORAL | Status: AC
  Administered 2020-08-11: 40 meq via ORAL
  Filled 2020-08-11: qty 2

## 2020-08-11 MED ORDER — SODIUM CHLORIDE 0.9% FLUSH
3.0000 mL | INTRAVENOUS | Status: DC | PRN
Start: 2020-08-11 — End: 2020-08-21
  Administered 2020-08-14: 3 mL via INTRAVENOUS

## 2020-08-11 MED ORDER — APIXABAN 5 MG PO TABS: 5.0000 mg | ORAL_TABLET | Freq: Two times a day (BID) | ORAL | Status: DC

## 2020-08-11 MED ORDER — ONDANSETRON HCL 4 MG/2ML IJ SOLN: 4.0000 mg | Freq: Four times a day (QID) | INTRAMUSCULAR | Status: DC | PRN

## 2020-08-11 MED ORDER — METOPROLOL TARTRATE 5 MG/5ML IV SOLN
5.0000 mg | Freq: Four times a day (QID) | INTRAVENOUS | Status: DC
  Administered 2020-08-11: 5 mg via INTRAVENOUS
  Filled 2020-08-11: qty 5

## 2020-08-11 MED ORDER — ACETAMINOPHEN 325 MG PO TABS
650.0000 mg | ORAL_TABLET | Freq: Four times a day (QID) | ORAL | Status: DC | PRN
  Administered 2020-08-11 – 2020-08-21 (×5): 650 mg via ORAL
  Filled 2020-08-11 (×6): qty 2

## 2020-08-11 MED ORDER — DILTIAZEM HCL-DEXTROSE 125-5 MG/125ML-% IV SOLN (PREMIX)
5.0000 mg/h | INTRAVENOUS | Status: DC
  Administered 2020-08-11: 5 mg/h via INTRAVENOUS
  Filled 2020-08-11: qty 125

## 2020-08-11 MED ORDER — ENOXAPARIN SODIUM 40 MG/0.4ML ~~LOC~~ SOLN: 40.0000 mg | SUBCUTANEOUS | Status: DC

## 2020-08-11 MED ORDER — DILTIAZEM HCL 50 MG/10ML IV SOLN
10.0000 mg | Freq: Once | INTRAVENOUS | Status: AC
  Administered 2020-08-11: 10 mg via INTRAVENOUS

## 2020-08-11 MED ORDER — HYDRALAZINE HCL 25 MG PO TABS
75.0000 mg | ORAL_TABLET | Freq: Three times a day (TID) | ORAL | Status: DC
  Administered 2020-08-11 – 2020-08-12 (×4): 75 mg via ORAL
  Filled 2020-08-11 (×4): qty 3

## 2020-08-11 MED ORDER — PANTOPRAZOLE SODIUM 40 MG PO TBEC
40.0000 mg | DELAYED_RELEASE_TABLET | Freq: Every day | ORAL | Status: DC
  Administered 2020-08-11 – 2020-08-21 (×10): 40 mg via ORAL
  Filled 2020-08-11 (×10): qty 1

## 2020-08-11 MED ORDER — IOHEXOL 350 MG/ML SOLN
100.0000 mL | Freq: Once | INTRAVENOUS | Status: AC | PRN
  Administered 2020-08-11: 100 mL via INTRAVENOUS

## 2020-08-11 MED ORDER — POTASSIUM CHLORIDE 10 MEQ/100ML IV SOLN
10.0000 meq | INTRAVENOUS | Status: AC
  Administered 2020-08-11 (×3): 10 meq via INTRAVENOUS
  Filled 2020-08-11 (×3): qty 100

## 2020-08-11 MED ORDER — NICARDIPINE HCL IN NACL 20-0.86 MG/200ML-% IV SOLN
3.0000 mg/h | INTRAVENOUS | Status: DC
  Administered 2020-08-11: 15 mg/h via INTRAVENOUS
  Administered 2020-08-11: 3 mg/h via INTRAVENOUS
  Filled 2020-08-11 (×2): qty 200

## 2020-08-11 MED ORDER — ONDANSETRON HCL 4 MG PO TABS: 4.0000 mg | ORAL_TABLET | Freq: Four times a day (QID) | ORAL | Status: DC | PRN

## 2020-08-11 MED ORDER — CARVEDILOL 25 MG PO TABS
25.0000 mg | ORAL_TABLET | Freq: Two times a day (BID) | ORAL | Status: DC
Start: 2020-08-11 — End: 2020-08-21
  Administered 2020-08-11 – 2020-08-21 (×20): 25 mg via ORAL
  Filled 2020-08-11: qty 2
  Filled 2020-08-11 (×8): qty 1
  Filled 2020-08-11: qty 2
  Filled 2020-08-11 (×3): qty 1
  Filled 2020-08-11 (×2): qty 2
  Filled 2020-08-11 (×5): qty 1

## 2020-08-11 MED ORDER — AMLODIPINE BESYLATE 10 MG PO TABS
10.0000 mg | ORAL_TABLET | Freq: Every day | ORAL | Status: DC
  Administered 2020-08-11 – 2020-08-14 (×4): 10 mg via ORAL
  Filled 2020-08-11: qty 2
  Filled 2020-08-11: qty 1
  Filled 2020-08-11: qty 2
  Filled 2020-08-11: qty 1

## 2020-08-11 MED ORDER — VANCOMYCIN HCL IN DEXTROSE 1-5 GM/200ML-% IV SOLN
1000.0000 mg | Freq: Once | INTRAVENOUS | Status: AC
  Administered 2020-08-11: 1000 mg via INTRAVENOUS
  Filled 2020-08-11: qty 200

## 2020-08-11 MED ORDER — LISINOPRIL 40 MG PO TABS
40.0000 mg | ORAL_TABLET | Freq: Every day | ORAL | Status: DC
  Administered 2020-08-11 – 2020-08-21 (×10): 40 mg via ORAL
  Filled 2020-08-11: qty 1
  Filled 2020-08-11: qty 4
  Filled 2020-08-11 (×6): qty 1
  Filled 2020-08-11: qty 4
  Filled 2020-08-11: qty 1

## 2020-08-11 MED ORDER — POTASSIUM CHLORIDE CRYS ER 20 MEQ PO TBCR
40.0000 meq | EXTENDED_RELEASE_TABLET | Freq: Two times a day (BID) | ORAL | Status: AC
  Administered 2020-08-11 (×2): 40 meq via ORAL
  Filled 2020-08-11 (×2): qty 2

## 2020-08-11 MED ORDER — HEPARIN BOLUS VIA INFUSION
3000.0000 [IU] | Freq: Once | INTRAVENOUS | Status: AC
  Administered 2020-08-11: 3000 [IU] via INTRAVENOUS

## 2020-08-11 MED ORDER — SODIUM CHLORIDE 0.9 % IV SOLN
250.0000 mL | INTRAVENOUS | Status: DC | PRN
  Administered 2020-08-11: 250 mL via INTRAVENOUS

## 2020-08-11 MED ORDER — ACETAMINOPHEN 650 MG RE SUPP: 650.0000 mg | Freq: Four times a day (QID) | RECTAL | Status: DC | PRN

## 2020-08-11 MED ORDER — HEPARIN BOLUS VIA INFUSION
3000.0000 [IU] | Freq: Once | INTRAVENOUS | Status: AC
  Administered 2020-08-11: 3000 [IU] via INTRAVENOUS
  Filled 2020-08-11: qty 3000

## 2020-08-11 MED ORDER — VANCOMYCIN HCL 750 MG/150ML IV SOLN
750.0000 mg | INTRAVENOUS | Status: DC
  Administered 2020-08-11 – 2020-08-16 (×6): 750 mg via INTRAVENOUS
  Filled 2020-08-11 (×8): qty 150

## 2020-08-11 MED ORDER — SODIUM CHLORIDE 0.9% FLUSH
3.0000 mL | Freq: Two times a day (BID) | INTRAVENOUS | Status: DC
  Administered 2020-08-11 – 2020-08-17 (×9): 3 mL via INTRAVENOUS

## 2020-08-11 MED ORDER — CHLORHEXIDINE GLUCONATE CLOTH 2 % EX PADS
6.0000 | MEDICATED_PAD | Freq: Every day | CUTANEOUS | Status: DC
  Administered 2020-08-11 – 2020-08-12 (×2): 6 via TOPICAL

## 2020-08-11 MED ORDER — SODIUM CHLORIDE 0.9 % IV SOLN: INTRAVENOUS | Status: DC | PRN

## 2020-08-11 NOTE — Progress Notes (Signed)
RN called for placement of arterial line to monitor patient elevated blood pressure. RT set up line and pressure bag to monitor.RT's attempted several times without success. Pressure dressing was applied to left wrist after procedure.Patient is resting comfortably and blood pressure is being monitored by RN .

## 2020-08-11 NOTE — Progress Notes (Signed)
ANTICOAGULATION CONSULT NOTE - Initial Consult  Pharmacy Consult for heparin gtt  Indication: atrial fibrillation  No Known Allergies  Patient Measurements: Height: 5\' 2"  (157.5 cm) Weight: 44.7 kg (98 lb 8 oz) IBW/kg (Calculated) : 50.1 Heparin Dosing Weight: HEPARIN DW (KG): 44.7   Vital Signs: BP: 159/105 (03/13 0830) Pulse Rate: 143 (03/13 0830)  Labs: Recent Labs    08/10/20 2207 08/11/20 0552  HGB 10.0*  --   HCT 30.5*  --   PLT 183  --   CREATININE 0.70 0.51    Estimated Creatinine Clearance: 50.1 mL/min (by C-G formula based on SCr of 0.51 mg/dL).   Medical History: Past Medical History:  Diagnosis Date  . Hypertension     Medications:  (Not in a hospital admission)  Scheduled:  . amLODipine  10 mg Oral Daily  . carvedilol  25 mg Oral BID WC  . heparin  3,000 Units Intravenous Once  . hydrALAZINE  75 mg Oral Q8H  . lisinopril  40 mg Oral Daily  . pantoprazole  40 mg Oral Daily  . potassium chloride  40 mEq Oral BID  . sodium chloride flush  3 mL Intravenous Q12H   Infusions:  . sodium chloride    . sodium chloride    . diltiazem (CARDIZEM) infusion 10 mg/hr (08/11/20 0753)  . heparin    . potassium chloride 10 mEq (08/11/20 0849)  . vancomycin     PRN: Place/Maintain arterial line **AND** sodium chloride, sodium chloride, acetaminophen **OR** acetaminophen, ondansetron **OR** ondansetron (ZOFRAN) IV, sodium chloride flush Anti-infectives (From admission, onward)   Start     Dose/Rate Route Frequency Ordered Stop   08/11/20 2200  vancomycin (VANCOREADY) IVPB 750 mg/150 mL        750 mg 150 mL/hr over 60 Minutes Intravenous Every 24 hours 08/11/20 0019     08/11/20 0015  vancomycin (VANCOCIN) IVPB 1000 mg/200 mL premix        1,000 mg 200 mL/hr over 60 Minutes Intravenous  Once 08/11/20 0012 08/11/20 0306   08/10/20 2345  cefTRIAXone (ROCEPHIN) 1 g in sodium chloride 0.9 % 100 mL IVPB        1 g 200 mL/hr over 30 Minutes Intravenous  Once  08/10/20 2340 08/11/20 0100      Assessment: Sarah Bonilla a 65 y.o. female requires anticoagulation with a heparin iv infusion for the indication of  atrial fibrillation. Heparin gtt will be started following pharmacy protocol per pharmacy consult. Patient is not on previous oral anticoagulant that will require aPTT/HL correlation before transitioning to only HL monitoring.   Goal of Therapy:  Heparin level 0.3-0.7 units/ml Monitor platelets by anticoagulation protocol: Yes   Plan:  Give 3000 units bolus x 1 Start heparin infusion at 700 units/hr Check anti-Xa level in 6 hours and daily while on heparin Continue to monitor H&H and platelets  Heparin level to be drawn in 6 hours  Reynolds Kittel 08/11/2020,8:59 AM

## 2020-08-11 NOTE — Consult Note (Addendum)
[facility] Consult  Reason for Consult: Left great toe dry gangrene  Referring Physician:  Dr. Sherryll BurgerShah     HPI: Sarah Bonilla is a 65 y.o. female with a left great toe with a dry eschar on the tip of the left great toe and tissue loss around a partially missing nail. No active drainage. She was admitted with HTN emergency and says she has had this issue with the toe for months. She thinks she stumped it several times dating back to Thanksgiving/ Xmas timeframe. She denies any drainage.    She has had an Xray that demonstrated soft tissue swelling osteopenia but no overt osteomyelitis.    Past Medical History:  Diagnosis Date  . Hypertension     Past Surgical History:  Procedure Laterality Date  . CHOLECYSTECTOMY      Family History  Problem Relation Age of Onset  . CVA Mother   . Hypertension Father     Social History   Tobacco Use  . Smoking status: Current Every Day Smoker    Packs/day: 1.00    Types: Cigarettes  . Smokeless tobacco: Never Used  Vaping Use  . Vaping Use: Never used  Substance Use Topics  . Alcohol use: Yes    Comment: occasionally   . Drug use: Never    Medications:  I have reviewed the patient's current medications. Prior to Admission:  Medications Prior to Admission  Medication Sig Dispense Refill Last Dose  . acetaminophen (TYLENOL) 325 MG tablet Take 650 mg by mouth every 6 (six) hours as needed for moderate pain or headache.   Past Week at Unknown time  . amLODipine (NORVASC) 10 MG tablet Take 1 tablet (10 mg total) by mouth daily. 30 tablet 2 08/09/2020  . carvedilol (COREG) 25 MG tablet Take 1 tablet (25 mg total) by mouth 2 (two) times daily with a meal. 60 tablet 2 08/10/2020 at 0830  . hydrALAZINE (APRESOLINE) 25 MG tablet Take 3 tablets (75 mg total) by mouth every 8 (eight) hours. 90 tablet 2 08/10/2020 at Unknown time  . ibuprofen (ADVIL) 200 MG tablet Take 400 mg by mouth every 6 (six) hours as needed for mild pain.    Past Week at Unknown time  . lisinopril (ZESTRIL) 40 MG tablet Take 1 tablet (40 mg total) by mouth daily. 30 tablet 2 08/10/2020 at Unknown time  . pantoprazole (PROTONIX) 40 MG tablet Take 1 tablet (40 mg total) by mouth daily. 30 tablet 1 08/09/2020   Scheduled: . amLODipine  10 mg Oral Daily  . carvedilol  25 mg Oral BID WC  . Chlorhexidine Gluconate Cloth  6 each Topical Daily  . hydrALAZINE  75 mg Oral Q8H  . lisinopril  40 mg Oral Daily  . metoprolol tartrate  5 mg Intravenous Q6H  . pantoprazole  40 mg Oral Daily  . potassium chloride  40 mEq Oral BID  . sodium chloride flush  3 mL Intravenous Q12H   Continuous: . sodium chloride    . sodium chloride    . diltiazem (CARDIZEM) infusion 10 mg/hr (08/11/20 0753)  . heparin 700 Units/hr (08/11/20 0927)  . vancomycin     WUJ:WJXBJ/YNWGNFAOPRN:Place/Maintain arterial line **AND** sodium chloride, sodium chloride, acetaminophen **OR** acetaminophen, ondansetron **OR** ondansetron (ZOFRAN) IV, sodium chloride flush  No Known Allergies   ROS:  A comprehensive review of systems was negative except for: Cardiovascular: positive for HTN Musculoskeletal: positive for left great toe pain  Blood pressure 104/68, pulse (!) 143, temperature  98.3 F (36.8 C), temperature source Oral, resp. rate (!) 27, height 5\' 2"  (1.575 m), weight 44.7 kg, SpO2 97 %. Physical Exam Vitals reviewed.  HENT:     Head: Normocephalic.     Nose: Nose normal.  Eyes:     Extraocular Movements: Extraocular movements intact.  Cardiovascular:     Rate and Rhythm: Normal rate.     Pulses:          Dorsalis pedis pulses are 0 on the right side and 0 on the left side.       Posterior tibial pulses are 0 on the right side and 0 on the left side.     Comments: 1+ Anterior tibial on the left and right  Pulmonary:     Effort: Pulmonary effort is normal.  Abdominal:     General: There is no distension.     Palpations: Abdomen is soft.     Tenderness: There is no abdominal  tenderness.  Skin:    General: Skin is warm.  Neurological:     General: No focal deficit present.     Mental Status: She is alert and oriented to person, place, and time.  Psychiatric:        Mood and Affect: Mood normal.        Behavior: Behavior normal.        Thought Content: Thought content normal.        Judgment: Judgment normal.       Results: Results for orders placed or performed during the hospital encounter of 08/10/20 (from the past 48 hour(s))  Basic metabolic panel     Status: Abnormal   Collection Time: 08/10/20 10:07 PM  Result Value Ref Range   Sodium 142 135 - 145 mmol/L   Potassium 2.4 (LL) 3.5 - 5.1 mmol/L    Comment: CRITICAL RESULT CALLED TO, READ BACK BY AND VERIFIED WITH: J SAPPELT AT 2309 ON 08/10/2020 BY JPM     Chloride 106 98 - 111 mmol/L   CO2 25 22 - 32 mmol/L   Glucose, Bld 105 (H) 70 - 99 mg/dL    Comment: Glucose reference range applies only to samples taken after fasting for at least 8 hours.   BUN 23 8 - 23 mg/dL   Creatinine, Ser 10/10/2020 0.44 - 1.00 mg/dL   Calcium 9.2 8.9 - 2.42 mg/dL   GFR, Estimated 68.3 >41 mL/min    Comment: (NOTE) Calculated using the CKD-EPI Creatinine Equation (2021)    Anion gap 11 5 - 15    Comment: Performed at [facility], [address], [place], [place]  CBC     Status: Abnormal   Collection Time: 08/10/20 10:07 PM  Result Value Ref Range   WBC 7.3 4.0 - 10.5 K/uL   RBC 3.32 (L) 3.87 - 5.11 MIL/uL   Hemoglobin 10.0 (L) 12.0 - 15.0 g/dL   HCT 10/10/20 (L) 98.9 - 21.1 %   MCV 91.9 80.0 - 100.0 fL   MCH 30.1 26.0 - 34.0 pg   MCHC 32.8 30.0 - 36.0 g/dL   RDW 94.1 74.0 - 81.4 %   Platelets 183 150 - 400 K/uL   nRBC 0.0 0.0 - 0.2 %    Comment: Performed at [facility], [address] [place], [place]  Sedimentation rate     Status: Abnormal   Collection Time: 08/10/20 10:08 PM  Result Value Ref Range   Sed Rate 25 (H) 0 - 22 mm/hr  Comment: Performed at [facility], [address] [place], [place] [address]  C-reactive protein     Status: Abnormal   Collection Time: 08/10/20 10:08 PM  Result Value Ref Range   CRP 1.0 (H) <1.0 mg/dL    Comment: Performed at [facility], [address] [place], [place] [address]  Lactic acid, plasma     Status: None   Collection Time: 08/10/20 10:08 PM  Result Value Ref Range   Lactic Acid, Venous 1.1 0.5 - 1.9 mmol/L    Comment: Performed at [facility], [address] [place], [place] [address]  Hemoglobin A1c     Status: None   Collection Time: 08/10/20 10:08 PM  Result Value Ref Range   Hgb A1c MFr Bld 5.1 4.8 - 5.6 %    Comment: (NOTE) Pre diabetes:          5.7%-6.4%  Diabetes:              >6.4%  Glycemic control for   <7.0% adults with diabetes    Mean Plasma Glucose 99.67 mg/dL    Comment: Performed at [facility], [address], [place], [place] [address]  Lactic acid, plasma     Status: None   Collection Time: 08/10/20 11:52 PM  Result Value Ref Range   Lactic Acid, Venous 1.5 0.5 - 1.9 mmol/L    Comment: Performed at [facility], [address] [place], [place] [address]  D-dimer, quantitative     Status: Abnormal   Collection Time: 08/10/20 11:52 PM  Result Value Ref Range   D-Dimer, Quant 0.70 (H) 0.00 - 0.50 ug/mL-FEU    Comment: (NOTE) At the manufacturer cut-off value of 0.5 g/mL FEU, this assay has a negative predictive value of 95-100%.This assay is intended for use in conjunction with a clinical pretest probability (PTP) assessment model to exclude pulmonary embolism (PE) and deep venous thrombosis (DVT) in outpatients suspected of PE or DVT. Results should be correlated with clinical presentation. Performed at [facility], [address], [place], [place] [address]   Brain natriuretic peptide     Status: Abnormal   Collection Time: 08/10/20 11:52 PM  Result Value Ref Range   B Natriuretic Peptide 2,259.0 (H) 0.0 - 100.0 pg/mL    Comment: Performed at [facility], [address] [place], [place] [address]  Magnesium     Status: None   Collection Time: 08/10/20 11:52 PM  Result Value Ref Range   Magnesium 1.7 1.7 - 2.4 mg/dL    Comment: Performed at [facility], [address] [place], [place], [place] [address]  Blood culture (routine x 2)     Status: None (Preliminary result)   Collection Time: 08/10/20 11:53 PM   Specimen: BLOOD RIGHT FOREARM  Result Value Ref Range   Specimen Description BLOOD RIGHT FOREARM    Special Requests      BOTTLES DRAWN AEROBIC AND ANAEROBIC Blood Culture adequate volume   Culture      NO GROWTH < 12 HOURS Performed at [facility], [address], [place], [place] [address]    Report Status PENDING   Blood culture (routine x 2)     Status: None (Preliminary result)   Collection Time: 08/11/20 12:00 AM   Specimen: BLOOD RIGHT ARM  Result Value Ref Range   Specimen Description BLOOD RIGHT ARM    Special Requests      BOTTLES DRAWN AEROBIC AND ANAEROBIC Blood Culture results may not be optimal due to an  excessive volume of blood received in culture bottles   Culture      NO GROWTH < 12 HOURS Performed at [facility], [address], [place], [place] [address]    Report Status PENDING   Resp Panel by RT-PCR (Flu A&B, Covid) Nasopharyngeal Swab     Status: None   Collection Time: 08/11/20  1:07 AM   Specimen: Nasopharyngeal Swab; Nasopharyngeal(NP) swabs in vial transport medium  Result Value Ref Range   SARS Coronavirus 2 by RT PCR NEGATIVE NEGATIVE    Comment: (NOTE) SARS-CoV-2 target nucleic acids are NOT DETECTED.  The SARS-CoV-2 RNA is generally detectable in upper respiratory specimens during the acute phase of infection. The lowest concentration of SARS-CoV-2 viral copies this assay can detect is 138 copies/mL. A negative result does not preclude SARS-Cov-2 infection and should not be used as the sole basis for treatment or other patient management decisions. A negative result may occur with  improper specimen  collection/handling, submission of specimen other than nasopharyngeal swab, presence of viral mutation(s) within the areas targeted by this assay, and inadequate number of viral copies(<138 copies/mL). A negative result must be combined with clinical observations, patient history, and epidemiological information. The expected result is Negative.  Fact Sheet for Patients:  BloggerCourse.com  Fact Sheet for Healthcare Providers:  SeriousBroker.it  This test is no t yet approved or cleared by the [place] FDA and  has been authorized for detection and/or diagnosis of SARS-CoV-2 by FDA under an Emergency Use Authorization (EUA). This EUA will remain  in effect (meaning this test can be used) for the duration of the COVID-19 declaration under Section 564(b)(1) of the Act, 21 U.S.C.section 360bbb-3(b)(1), unless the authorization is terminated  or revoked sooner.       Influenza A by PCR NEGATIVE NEGATIVE   Influenza B by PCR NEGATIVE NEGATIVE    Comment: (NOTE) The Xpert Xpress SARS-CoV-2/FLU/RSV plus assay is intended as an aid in the diagnosis of influenza from Nasopharyngeal swab specimens and should not be used as a sole basis for treatment. Nasal washings and aspirates are unacceptable for Xpert Xpress SARS-CoV-2/FLU/RSV testing.  Fact Sheet for Patients: BloggerCourse.com  Fact Sheet for Healthcare Providers: SeriousBroker.it  This test is not yet approved or cleared by the [place] FDA and has been authorized for detection and/or diagnosis of SARS-CoV-2 by FDA under an Emergency Use Authorization (EUA). This EUA will remain in effect (meaning this test can be used) for the duration of the COVID-19 declaration under Section 564(b)(1) of the Act, 21 U.S.C. section 360bbb-3(b)(1), unless the authorization is terminated or revoked.  Performed at [facility],  [address], [place], [place] [address]   Basic metabolic panel     Status: Abnormal   Collection Time: 08/11/20  5:52 AM  Result Value Ref Range   Sodium 142 135 - 145 mmol/L   Potassium 2.5 (LL) 3.5 - 5.1 mmol/L    Comment: CRITICAL RESULT CALLED TO, READ BACK BY AND VERIFIED WITH: J SAPPELT @6051  by [email or web address], b 3.13.22    Chloride 101 98 - 111 mmol/L   CO2 29 22 - 32 mmol/L   Glucose, Bld 102 (H) 70 - 99 mg/dL    Comment: Glucose reference range applies only to samples taken after fasting for at least 8 hours.   BUN 18 8 - 23 mg/dL   Creatinine, Ser 06-06-1981 0.44 - 1.00 mg/dL   Calcium 9.0 8.9 - 4.09 mg/dL   GFR, Estimated 73.5 >32 mL/min  Comment: (NOTE) Calculated using the CKD-EPI Creatinine Equation (2021)    Anion gap 12 5 - 15    Comment: Performed at [facility], [address], [place], [place] [address]   Personally reviewed - Xray with soft tissue loss around the great toe, swelling  CT Angio Chest PE W and/or Wo Contrast  Result Date: 08/11/2020 CLINICAL DATA:  Pulmonary embolus suspected. Swelling left lower extremity. Injured for months. EXAM: CT ANGIOGRAPHY CHEST WITH CONTRAST TECHNIQUE: Multidetector CT imaging of the chest was performed using the standard protocol during bolus administration of intravenous contrast. Multiplanar CT image reconstructions and MIPs were obtained to evaluate the vascular anatomy. CONTRAST:  OMNIPAQUE IOHEXOL 350 MG/ML SOLN COMPARISON:  Chest x-ray 08/10/2020 FINDINGS: Cardiovascular: Satisfactory opacification of the pulmonary arteries to the segmental level. No evidence of pulmonary embolism. The main pulmonary artery is normal in caliber. Mild atherosclerotic plaque of the thoracic aorta. severe left anterior descending left circumflex as well as at least mild right main coronary artery calcifications. Mediastinum/Nodes: No enlarged mediastinal, hilar, or axillary lymph nodes. Thyroid gland, trachea, and esophagus demonstrate no significant  findings. Lungs/Pleura: Centrilobular emphysematous changes. Bilateral lower lobe subsegmental atelectasis. Limited evaluation for pulmonary nodularity due to motion artifact. No pulmonary mass. No focal consolidation. No pleural effusion. No pneumothorax. Upper Abdomen: Status post cholecystectomy. Atherosclerotic plaque. No acute abnormality. Musculoskeletal: No chest wall abnormality. Likely bone island of the left posterior eighth rib. No suspicious lytic or blastic osseous lesions. No acute displaced fracture. Review of the MIP images confirms the above findings. IMPRESSION: 1. No central or segmental pulmonary embolus. Limited evaluation at the subsegmental level due to motion artifact. 2. No acute intrapulmonary abnormality. 3. Aortic Atherosclerosis (ICD10-I70.0) including 3 vessel mild to severe coronary artery calcification. 4.  Emphysema (ICD10-J43.9). Electronically Signed   By: Tish Frederickson M.D.   On: 08/11/2020 04:06   DG Chest Portable 1 View  Result Date: 08/10/2020 CLINICAL DATA:  Hypoxia EXAM: PORTABLE CHEST 1 VIEW COMPARISON:  04/19/2020 FINDINGS: The lungs are hyperinflated with diffuse interstitial prominence. No focal airspace consolidation or pulmonary edema. No pleural effusion or pneumothorax. Normal cardiomediastinal contours. IMPRESSION: COPD without acute airspace disease. Electronically Signed   By: Deatra Robinson M.D.   On: 08/10/2020 23:58   DG Foot Complete Left  Result Date: 08/10/2020 CLINICAL DATA:  Left great toe swelling. EXAM: LEFT FOOT - COMPLETE 3+ VIEW COMPARISON:  None. FINDINGS: There is no evidence of fracture or dislocation. There is diffuse osteopenia with mild degenerative changes seen along the dorsal aspect of the proximal to mid left foot. Marked severity soft tissue swelling is seen throughout the left great toe. IMPRESSION: Marked severity soft tissue swelling throughout the left great toe without evidence of an acute osseous abnormality. Electronically  Signed   By: Aram Candela M.D.   On: 08/10/2020 22:10     Assessment & Plan:  CHIHIRO FREY is a 65 y.o. female with left great toe soft tissue loss and some evidence of dry gangrene at the tip, partially missing toe. Unable to tell if part of the bone near exposure or not but with the soft tissue loss it is a potential. Xray without osteo but can almost make out that tissue may not be fully covering the bone.  She does not have a palpable PT or DP that I can appreciate but I can fill a 1+ AT at the base of the anterior ankle / start of foot.    Betadine swab to  left great toe and wrap with dry gauze cling  Keep Dry ABI Pending May need MRI  May need Vascular   All questions were answered to the satisfaction of the patient and family.  Sarah Bonilla 08/11/2020, 12:47 PM

## 2020-08-11 NOTE — ED Notes (Signed)
Date and time results received: 08/11/20 0650   Test: K+ Critical Value: 2.5  Name of Provider Notified: Thomes Dinning, DO

## 2020-08-11 NOTE — Progress Notes (Signed)
Pharmacy Antibiotic Note  Sarah Bonilla is a 65 y.o. female admitted on 08/10/2020 with cellulitis.  Pharmacy has been consulted for Vancomycin dosing. WBC WNL. Renal function good.   Plan: Vancomycin 1000 mg IV x 1, then 750 mg IV q24h >>Estimated AUC: 457 Ceftriaxone 1g IV x 1 in the ED, f/u additional gram negative coverage if warranted Trend WBC, temp, renal function  F/U infectious work-up Drug levels as indicated   Height: 5\' 2"  (157.5 cm) Weight: 44.7 kg (98 lb 8 oz) IBW/kg (Calculated) : 50.1  Temp (24hrs), Avg:98.3 F (36.8 C), Min:98.3 F (36.8 C), Max:98.3 F (36.8 C)  Recent Labs  Lab 08/10/20 2207 08/10/20 2208  WBC 7.3  --   CREATININE 0.70  --   LATICACIDVEN  --  1.1    Estimated Creatinine Clearance: 50.1 mL/min (by C-G formula based on SCr of 0.7 mg/dL).    No Known Allergies  2209, PharmD, BCPS Clinical Pharmacist Phone: [phone]

## 2020-08-11 NOTE — Progress Notes (Signed)
ANTICOAGULATION CONSULT NOTE  Pharmacy Consult for heparin  Indication: atrial fibrillation  No Known Allergies  Patient Measurements: Height: 5\' 2"  (157.5 cm) Weight: 44.7 kg (98 lb 8 oz) IBW/kg (Calculated) : 50.1 Heparin Dosing Weight: HEPARIN DW (KG): 44.7   Vital Signs: Temp: 98.2 F (36.8 C) (03/13 2000) Temp Source: Oral (03/13 2000) BP: 153/95 (03/13 2103) Pulse Rate: 91 (03/13 1900)  Labs: Recent Labs    08/10/20 2207 08/11/20 0552 08/11/20 1446 08/11/20 2220  HGB 10.0*  --   --   --   HCT 30.5*  --   --   --   PLT 183  --   --   --   HEPARINUNFRC  --   --  <0.10* 0.21*  CREATININE 0.70 0.51  --   --     Estimated Creatinine Clearance: 50.1 mL/min (by C-G formula based on SCr of 0.51 mg/dL).   Assessment: 65 y.o. female with Afib for heparin  Goal of Therapy:  Heparin level 0.3-0.7 units/ml Monitor platelets by anticoagulation protocol: Yes   Plan:  Increase Heparin 1100 units/hr Follow-up am labs.  77 08/11/2020,11:48 PM

## 2020-08-11 NOTE — ED Notes (Signed)
Wound noted to left great toe.  No drainage noted.

## 2020-08-11 NOTE — Progress Notes (Signed)
ANTICOAGULATION CONSULT NOTE - Initial Consult  Pharmacy Consult for heparin gtt  Indication: atrial fibrillation  No Known Allergies  Patient Measurements: Height: 5\' 2"  (157.5 cm) Weight: 44.7 kg (98 lb 8 oz) IBW/kg (Calculated) : 50.1 Heparin Dosing Weight: HEPARIN DW (KG): 44.7   Vital Signs: Temp: 98.3 F (36.8 C) (03/13 1120) Temp Source: Oral (03/13 1120) BP: 104/68 (03/13 1000) Pulse Rate: 143 (03/13 0830)  Labs: Recent Labs    08/10/20 2207 08/11/20 0552 08/11/20 1446  HGB 10.0*  --   --   HCT 30.5*  --   --   PLT 183  --   --   HEPARINUNFRC  --   --  <0.10*  CREATININE 0.70 0.51  --     Estimated Creatinine Clearance: 50.1 mL/min (by C-G formula based on SCr of 0.51 mg/dL).   Medical History: Past Medical History:  Diagnosis Date  . Hypertension     Medications:  Medications Prior to Admission  Medication Sig Dispense Refill Last Dose  . acetaminophen (TYLENOL) 325 MG tablet Take 650 mg by mouth every 6 (six) hours as needed for moderate pain or headache.   Past Week at Unknown time  . amLODipine (NORVASC) 10 MG tablet Take 1 tablet (10 mg total) by mouth daily. 30 tablet 2 08/09/2020  . carvedilol (COREG) 25 MG tablet Take 1 tablet (25 mg total) by mouth 2 (two) times daily with a meal. 60 tablet 2 08/10/2020 at 0830  . hydrALAZINE (APRESOLINE) 25 MG tablet Take 3 tablets (75 mg total) by mouth every 8 (eight) hours. 90 tablet 2 08/10/2020 at Unknown time  . ibuprofen (ADVIL) 200 MG tablet Take 400 mg by mouth every 6 (six) hours as needed for mild pain.   Past Week at Unknown time  . lisinopril (ZESTRIL) 40 MG tablet Take 1 tablet (40 mg total) by mouth daily. 30 tablet 2 08/10/2020 at Unknown time  . pantoprazole (PROTONIX) 40 MG tablet Take 1 tablet (40 mg total) by mouth daily. 30 tablet 1 08/09/2020   Scheduled:  . amLODipine  10 mg Oral Daily  . carvedilol  25 mg Oral BID WC  . Chlorhexidine Gluconate Cloth  6 each Topical Daily  . heparin  3,000  Units Intravenous Once  . hydrALAZINE  75 mg Oral Q8H  . lisinopril  40 mg Oral Daily  . pantoprazole  40 mg Oral Daily  . potassium chloride  40 mEq Oral BID  . sodium chloride flush  3 mL Intravenous Q12H   Infusions:  . sodium chloride    . sodium chloride    . diltiazem (CARDIZEM) infusion 10 mg/hr (08/11/20 0753)  . heparin 700 Units/hr (08/11/20 0927)  . vancomycin     PRN: Place/Maintain arterial line **AND** sodium chloride, sodium chloride, acetaminophen **OR** acetaminophen, ondansetron **OR** ondansetron (ZOFRAN) IV, sodium chloride flush Anti-infectives (From admission, onward)   Start     Dose/Rate Route Frequency Ordered Stop   08/11/20 2200  vancomycin (VANCOREADY) IVPB 750 mg/150 mL        750 mg 150 mL/hr over 60 Minutes Intravenous Every 24 hours 08/11/20 0019     08/11/20 0015  vancomycin (VANCOCIN) IVPB 1000 mg/200 mL premix        1,000 mg 200 mL/hr over 60 Minutes Intravenous  Once 08/11/20 0012 08/11/20 0306   08/10/20 2345  cefTRIAXone (ROCEPHIN) 1 g in sodium chloride 0.9 % 100 mL IVPB        1 g 200 mL/hr over  30 Minutes Intravenous  Once 08/10/20 2340 08/11/20 0100      Assessment: Sarah Bonilla a 65 y.o. female requires anticoagulation with a heparin iv infusion for the indication of  atrial fibrillation. Heparin gtt will be started following pharmacy protocol per pharmacy consult. Patient is not on previous oral anticoagulant that will require aPTT/HL correlation before transitioning to only HL monitoring.   3/13 1500 HL: <0.10, subtherapeutic   Goal of Therapy:  Heparin level 0.3-0.7 units/ml Monitor platelets by anticoagulation protocol: Yes   Plan: Give 3000 units bolus x 1 Increase heparin infusion to 950 units/hr Check anti-Xa level in 6 hours and daily while on heparin Continue to monitor H&H and platelets  Heparin level to be drawn in 6 hours  Sarah Bonilla 08/11/2020,3:39 PM

## 2020-08-11 NOTE — H&P (Addendum)
History and Physical    Sarah Bonilla:[id number] DOB: 11-12-1955 DOA: 08/10/2020  PCP: Patient, No Pcp Per   Patient coming from: Home  Chief Complaint: Dyspnea/L toe pain  HPI: Sarah Bonilla is a 65 y.o. female with medical history significant for poorly controlled hypertension due to medication and dietary noncompliance, tobacco abuse with COPD, chronic diastolic congestive heart failure, and chronic left lower extremity DVT who presented to the ED with complaints of some leg swelling over the last 1 month which started after she stubbed her left great toe.  She has had increased pain since then, but then developed increasing shortness of breath and dry cough which prompted her to come to the ED.  She denies any fevers or chills or drainage from the site of her toe.  She has been otherwise able to ambulate and states that she takes her medications regularly aside from not having taking them all day yesterday.  She states that she does the best she can with taking her medications, but does not always take them when she is supposed to.  She also states that she tries to do her best with her diet, but does eat canned foods and eats out occasionally.   ED Course: Vital signs with elevated blood pressure readings and tachyarrhythmia noted.  EKG with atrial flutter.  Patient was started initially on nitroglycerin drip and then switched over to Cardene with better improvement in blood pressure readings.  She was also started on diltiazem drip for her atrial flutter which appears to be new onset.  BNP is 2259, but she does not appear to be in any respiratory distress, nor does she appear volume overloaded.  Potassium is 2.5 and hemoglobin is 10.  Potassium repletion has been ordered.  CT angiogram for PE study was performed and is negative for PE.  She has also been started on vancomycin for left great toe pain with possible cellulitis.  Left foot x-ray with soft tissue swelling, but no signs of bony  abnormalities to include osteomyelitis.  Review of Systems: Reviewed as noted above, otherwise negative.  Past Medical History:  Diagnosis Date  . Hypertension     Past Surgical History:  Procedure Laterality Date  . CHOLECYSTECTOMY       reports that she has been smoking cigarettes. She has been smoking about 1.00 pack per day. She has never used smokeless tobacco. She reports current alcohol use. She reports that she does not use drugs.  No Known Allergies  Family History  Problem Relation Age of Onset  . CVA Mother   . Hypertension Father     Prior to Admission medications   Medication Sig Start Date End Date Taking? Authorizing Provider  acetaminophen (TYLENOL) 325 MG tablet Take 650 mg by mouth every 6 (six) hours as needed for moderate pain or headache.   Yes [provider]  amLODipine (NORVASC) 10 MG tablet Take 1 tablet (10 mg total) by mouth daily. 04/25/20  Yes Vassie Loll, MD  carvedilol (COREG) 25 MG tablet Take 1 tablet (25 mg total) by mouth 2 (two) times daily with a meal. 04/24/20  Yes Vassie Loll, MD  hydrALAZINE (APRESOLINE) 25 MG tablet Take 3 tablets (75 mg total) by mouth every 8 (eight) hours. 04/24/20  Yes Vassie Loll, MD  ibuprofen (ADVIL) 200 MG tablet Take 400 mg by mouth every 6 (six) hours as needed for mild pain.   Yes [provider]  lisinopril (ZESTRIL) 40 MG tablet Take 1  tablet (40 mg total) by mouth daily. 04/25/20  Yes Vassie LollMadera, Carlos, MD  pantoprazole (PROTONIX) 40 MG tablet Take 1 tablet (40 mg total) by mouth daily. 04/25/20  Yes Vassie LollMadera, Carlos, MD    Physical Exam: Vitals:   08/11/20 0620 08/11/20 0640 08/11/20 0645 08/11/20 0650  BP: (!) 166/70 (!) 162/92 (!) 155/82 (!) 167/86  Pulse: 95 (!) 143 (!) 151 (!) 145  Resp: (!) 26 (!) 25 (!) 26 19  Temp:      TempSrc:      SpO2: 95% 95% 94% 95%  Weight:      Height:        Constitutional: NAD, calm, comfortable Vitals:   08/11/20 0620 08/11/20 0640  08/11/20 0645 08/11/20 0650  BP: (!) 166/70 (!) 162/92 (!) 155/82 (!) 167/86  Pulse: 95 (!) 143 (!) 151 (!) 145  Resp: (!) 26 (!) 25 (!) 26 19  Temp:      TempSrc:      SpO2: 95% 95% 94% 95%  Weight:      Height:       Eyes: lids and conjunctivae normal Neck: normal, supple Respiratory: clear to auscultation bilaterally. Normal respiratory effort. No accessory muscle use.  Cardiovascular: Tachycardic and irregular, no murmurs. Abdomen: no tenderness, no distention. Bowel sounds positive.  Musculoskeletal:  No edema. Skin:    Psychiatric: Flat affect  Labs on Admission: I have personally reviewed following labs and imaging studies  CBC: Recent Labs  Lab 08/10/20 2207  WBC 7.3  HGB 10.0*  HCT 30.5*  MCV 91.9  PLT 183   Basic Metabolic Panel: Recent Labs  Lab 08/10/20 2207 08/10/20 2352 08/11/20 0552  NA 142  --  142  K 2.4*  --  2.5*  CL 106  --  101  CO2 25  --  29  GLUCOSE 105*  --  102*  BUN 23  --  18  CREATININE 0.70  --  0.51  CALCIUM 9.2  --  9.0  MG  --  1.7  --    GFR: Estimated Creatinine Clearance: 50.1 mL/min (by C-G formula based on SCr of 0.51 mg/dL). Liver Function Tests: No results for input(s): AST, ALT, ALKPHOS, BILITOT, PROT, ALBUMIN in the last 168 hours. No results for input(s): LIPASE, AMYLASE in the last 168 hours. No results for input(s): AMMONIA in the last 168 hours. Coagulation Profile: No results for input(s): INR, PROTIME in the last 168 hours. Cardiac Enzymes: No results for input(s): CKTOTAL, CKMB, CKMBINDEX, TROPONINI in the last 168 hours. BNP (last 3 results) No results for input(s): PROBNP in the last 8760 hours. HbA1C: No results for input(s): HGBA1C in the last 72 hours. CBG: No results for input(s): GLUCAP in the last 168 hours. Lipid Profile: No results for input(s): CHOL, HDL, LDLCALC, TRIG, CHOLHDL, LDLDIRECT in the last 72 hours. Thyroid Function Tests: No results for input(s): TSH, T4TOTAL, FREET4, T3FREE,  THYROIDAB in the last 72 hours. Anemia Panel: No results for input(s): VITAMINB12, FOLATE, FERRITIN, TIBC, IRON, RETICCTPCT in the last 72 hours. Urine analysis: No results found for: COLORURINE, APPEARANCEUR, LABSPEC, PHURINE, GLUCOSEU, HGBUR, BILIRUBINUR, KETONESUR, PROTEINUR, UROBILINOGEN, NITRITE, LEUKOCYTESUR  Radiological Exams on Admission: CT Angio Chest PE W and/or Wo Contrast  Result Date: 08/11/2020 CLINICAL DATA:  Pulmonary embolus suspected. Swelling left lower extremity. Injured for months. EXAM: CT ANGIOGRAPHY CHEST WITH CONTRAST TECHNIQUE: Multidetector CT imaging of the chest was performed using the standard protocol during bolus administration of intravenous contrast. Multiplanar CT image reconstructions and  MIPs were obtained to evaluate the vascular anatomy. CONTRAST:  OMNIPAQUE IOHEXOL 350 MG/ML SOLN COMPARISON:  Chest x-ray 08/10/2020 FINDINGS: Cardiovascular: Satisfactory opacification of the pulmonary arteries to the segmental level. No evidence of pulmonary embolism. The main pulmonary artery is normal in caliber. Mild atherosclerotic plaque of the thoracic aorta. severe left anterior descending left circumflex as well as at least mild right main coronary artery calcifications. Mediastinum/Nodes: No enlarged mediastinal, hilar, or axillary lymph nodes. Thyroid gland, trachea, and esophagus demonstrate no significant findings. Lungs/Pleura: Centrilobular emphysematous changes. Bilateral lower lobe subsegmental atelectasis. Limited evaluation for pulmonary nodularity due to motion artifact. No pulmonary mass. No focal consolidation. No pleural effusion. No pneumothorax. Upper Abdomen: Status post cholecystectomy. Atherosclerotic plaque. No acute abnormality. Musculoskeletal: No chest wall abnormality. Likely bone island of the left posterior eighth rib. No suspicious lytic or blastic osseous lesions. No acute displaced fracture. Review of the MIP images confirms the above  findings. IMPRESSION: 1. No central or segmental pulmonary embolus. Limited evaluation at the subsegmental level due to motion artifact. 2. No acute intrapulmonary abnormality. 3. Aortic Atherosclerosis (ICD10-I70.0) including 3 vessel mild to severe coronary artery calcification. 4.  Emphysema (ICD10-J43.9). Electronically Signed   By: Tish Frederickson M.D.   On: 08/11/2020 04:06   DG Chest Portable 1 View  Result Date: 08/10/2020 CLINICAL DATA:  Hypoxia EXAM: PORTABLE CHEST 1 VIEW COMPARISON:  04/19/2020 FINDINGS: The lungs are hyperinflated with diffuse interstitial prominence. No focal airspace consolidation or pulmonary edema. No pleural effusion or pneumothorax. Normal cardiomediastinal contours. IMPRESSION: COPD without acute airspace disease. Electronically Signed   By: Deatra Robinson M.D.   On: 08/10/2020 23:58   DG Foot Complete Left  Result Date: 08/10/2020 CLINICAL DATA:  Left great toe swelling. EXAM: LEFT FOOT - COMPLETE 3+ VIEW COMPARISON:  None. FINDINGS: There is no evidence of fracture or dislocation. There is diffuse osteopenia with mild degenerative changes seen along the dorsal aspect of the proximal to mid left foot. Marked severity soft tissue swelling is seen throughout the left great toe. IMPRESSION: Marked severity soft tissue swelling throughout the left great toe without evidence of an acute osseous abnormality. Electronically Signed   By: Aram Candela M.D.   On: 08/10/2020 22:10    EKG: Independently reviewed. Aflutter with 2:1 block 149bpm.  Assessment/Plan Active Problems:   Hypertensive emergency    Hypertensive crisis -Likely secondary to dietary/medication noncompliance -Patient was initially started on nitroglycerin drip and then transitioned to cardene which appears to have brought her blood pressure down -Plan to discontinue Cardene at this point and resume home oral medications  Atrial flutter -Appears to be of new onset -Plan to obtain limited 2D  echocardiogram with prior on 04/2020 with LVEF 55-60% and grade 3 diastolic dysfunction -CHADs2VASc of 3 and will be started on heparin drip -Check TSH -Follow EKG with Cardiology eval in am  Hypokalemia -Replete and reevaluate  Left great toe pain likely secondary to cellulitis/dry gangrene -Maintain on vancomycin as ordered -Appreciate general surgery consultation -Obtain ABI  History of COPD with tobacco abuse -No acute exacerbation noted -Counseled on tobacco cessation -As needed bronchodilators -PFTs outpatient  Chronic left lower extremity DVT -Chronic anticoagulation not started due to issues with chronic anemia -Also not started on medication due to noncompliance issues -Will need hematology follow-up outpatient -CT angiogram negative for PE  Chronic anemia -Hematology follow-up outpatient as noted above -Follow CBC -No overt bleeding noted   DVT prophylaxis: Heparin drip Code Status: Full Family Communication: None  at bedside, patient will call Disposition Plan: Admit for treatment of hypertension and evaluation of toe pain Consults called:General Surgery Admission status: Inpatient, SDU  Teon Hudnall D Mayu Ronk DO [facility]  If 7PM-7AM, please contact night-coverage [email or web address]  08/11/2020, 8:21 AM

## 2020-08-11 NOTE — ED Notes (Signed)
Patient transported to CT 

## 2020-08-12 ENCOUNTER — Inpatient Hospital Stay (HOSPITAL_COMMUNITY): Payer: Self-pay

## 2020-08-12 DIAGNOSIS — R9431 Abnormal electrocardiogram [ECG] [EKG]: Secondary | ICD-10-CM

## 2020-08-12 DIAGNOSIS — I361 Nonrheumatic tricuspid (valve) insufficiency: Secondary | ICD-10-CM

## 2020-08-12 DIAGNOSIS — E876 Hypokalemia: Secondary | ICD-10-CM

## 2020-08-12 DIAGNOSIS — J449 Chronic obstructive pulmonary disease, unspecified: Secondary | ICD-10-CM

## 2020-08-12 DIAGNOSIS — Z720 Tobacco use: Secondary | ICD-10-CM

## 2020-08-12 DIAGNOSIS — I340 Nonrheumatic mitral (valve) insufficiency: Secondary | ICD-10-CM

## 2020-08-12 LAB — ECHOCARDIOGRAM COMPLETE
Area-P 1/2: 3.46 cm2
Height: 62 in
MV M vel: 5.84 m/s
MV Peak grad: 136.4 mmHg
Radius: 0.6 cm
S' Lateral: 2.6 cm
Weight: 1576 oz

## 2020-08-12 LAB — HEPARIN LEVEL (UNFRACTIONATED)
Heparin Unfractionated: 0.38 IU/mL (ref 0.30–0.70)
Heparin Unfractionated: 0.32 IU/mL (ref 0.30–0.70)

## 2020-08-12 LAB — BASIC METABOLIC PANEL
Anion gap: 12 (ref 5–15)
BUN: 18 mg/dL (ref 8–23)
CO2: 23 mmol/L (ref 22–32)
Calcium: 9.2 mg/dL (ref 8.9–10.3)
Chloride: 109 mmol/L (ref 98–111)
Creatinine, Ser: 0.47 mg/dL (ref 0.44–1.00)
GFR, Estimated: >60 mL/min (ref >60)
Glucose, Bld: 110 mg/dL — ABNORMAL HIGH (ref 70–99)
Potassium: 3.2 mmol/L — ABNORMAL LOW (ref 3.5–5.1)
Sodium: 144 mmol/L (ref 135–145)

## 2020-08-12 LAB — CBC
HCT: 29.5 % — ABNORMAL LOW (ref 36.0–46.0)
Hemoglobin: 9.3 g/dL — ABNORMAL LOW (ref 12.0–15.0)
MCH: 29.5 pg (ref 26.0–34.0)
MCHC: 31.5 g/dL (ref 30.0–36.0)
MCV: 93.7 fL (ref 80.0–100.0)
Platelets: 176 10*3/uL (ref 150–400)
RBC: 3.15 MIL/uL — ABNORMAL LOW (ref 3.87–5.11)
RDW: 15.9 % — ABNORMAL HIGH (ref 11.5–15.5)
WBC: 12 10*3/uL — ABNORMAL HIGH (ref 4.0–10.5)
nRBC: 0 % (ref 0.0–0.2)

## 2020-08-12 LAB — MAGNESIUM: Magnesium: 1.7 mg/dL (ref 1.7–2.4)

## 2020-08-12 MED ORDER — POTASSIUM CHLORIDE CRYS ER 20 MEQ PO TBCR
40.0000 meq | EXTENDED_RELEASE_TABLET | Freq: Two times a day (BID) | ORAL | Status: AC
  Administered 2020-08-12 (×2): 40 meq via ORAL
  Filled 2020-08-12 (×2): qty 2

## 2020-08-12 MED ORDER — SPIRONOLACTONE 25 MG PO TABS
25.0000 mg | ORAL_TABLET | Freq: Every day | ORAL | Status: DC
  Administered 2020-08-12 – 2020-08-18 (×7): 25 mg via ORAL
  Filled 2020-08-12 (×7): qty 1

## 2020-08-12 MED ORDER — FUROSEMIDE 10 MG/ML IJ SOLN
40.0000 mg | Freq: Once | INTRAMUSCULAR | Status: AC
  Administered 2020-08-12: 40 mg via INTRAVENOUS
  Filled 2020-08-12: qty 4

## 2020-08-12 MED ORDER — IPRATROPIUM BROMIDE 0.02 % IN SOLN
RESPIRATORY_TRACT | Status: AC
  Administered 2020-08-12: 0.5 mg
  Filled 2020-08-12: qty 2.5

## 2020-08-12 MED ORDER — HYDRALAZINE HCL 50 MG PO TABS
100.0000 mg | ORAL_TABLET | Freq: Three times a day (TID) | ORAL | Status: DC
  Administered 2020-08-12 – 2020-08-21 (×26): 100 mg via ORAL
  Filled 2020-08-12 (×6): qty 2
  Filled 2020-08-12: qty 4
  Filled 2020-08-12 (×18): qty 2
  Filled 2020-08-12: qty 4
  Filled 2020-08-12: qty 2

## 2020-08-12 MED ORDER — LEVALBUTEROL HCL 0.63 MG/3ML IN NEBU: 0.6300 mg | INHALATION_SOLUTION | Freq: Four times a day (QID) | RESPIRATORY_TRACT | Status: DC | PRN

## 2020-08-12 MED ORDER — LABETALOL HCL 5 MG/ML IV SOLN
10.0000 mg | Freq: Four times a day (QID) | INTRAVENOUS | Status: DC | PRN
  Administered 2020-08-12: 10 mg via INTRAVENOUS
  Filled 2020-08-12: qty 4

## 2020-08-12 MED ORDER — ALBUTEROL SULFATE (2.5 MG/3ML) 0.083% IN NEBU
INHALATION_SOLUTION | RESPIRATORY_TRACT | Status: AC
  Administered 2020-08-12: 5 mg
  Filled 2020-08-12: qty 6

## 2020-08-12 MED ORDER — ALBUTEROL SULFATE (2.5 MG/3ML) 0.083% IN NEBU: 2.5000 mg | INHALATION_SOLUTION | RESPIRATORY_TRACT | Status: DC | PRN

## 2020-08-12 MED ORDER — LABETALOL HCL 5 MG/ML IV SOLN
5.0000 mg | Freq: Four times a day (QID) | INTRAVENOUS | Status: DC | PRN
  Administered 2020-08-12: 5 mg via INTRAVENOUS
  Filled 2020-08-12: qty 4

## 2020-08-12 MED ORDER — MORPHINE SULFATE (PF) 2 MG/ML IV SOLN
2.0000 mg | INTRAVENOUS | Status: DC | PRN
  Administered 2020-08-12 – 2020-08-15 (×9): 2 mg via INTRAVENOUS
  Filled 2020-08-12 (×9): qty 1

## 2020-08-12 MED ORDER — HYDRALAZINE HCL 20 MG/ML IJ SOLN
10.0000 mg | Freq: Once | INTRAMUSCULAR | Status: AC
  Administered 2020-08-12: 10 mg via INTRAVENOUS
  Filled 2020-08-12: qty 1

## 2020-08-12 MED ORDER — IPRATROPIUM-ALBUTEROL 0.5-2.5 (3) MG/3ML IN SOLN: 3.0000 mL | Freq: Three times a day (TID) | RESPIRATORY_TRACT | Status: DC

## 2020-08-12 NOTE — Progress Notes (Signed)
ANTICOAGULATION CONSULT NOTE  Pharmacy Consult for heparin  Indication: atrial fibrillation  No Known Allergies  Patient Measurements: Height: 5\' 2"  (157.5 cm) Weight: 44.7 kg (98 lb 8 oz) IBW/kg (Calculated) : 50.1 Heparin Dosing Weight: HEPARIN DW (KG): 44.7   Vital Signs: Temp: 99.3 F (37.4 C) (03/14 0740) Temp Source: Oral (03/14 0740) BP: 175/91 (03/14 0800) Pulse Rate: 99 (03/14 0800)  Labs: Recent Labs    08/10/20 2207 08/11/20 0552 08/11/20 1446 08/11/20 2220 08/12/20 0802  HGB 10.0*  --   --   --  9.3*  HCT 30.5*  --   --   --  29.5*  PLT 183  --   --   --  176  HEPARINUNFRC  --   --  <0.10* 0.21* 0.38  CREATININE 0.70 0.51  --   --   --     Estimated Creatinine Clearance: 50.1 mL/min (by C-G formula based on SCr of 0.51 mg/dL).   Assessment: 65 y.o. female with Afib for heparin  Heparin level 0.38: therapeutic   Goal of Therapy:  Heparin level 0.3-0.7 units/ml Monitor platelets by anticoagulation protocol: Yes   Plan:  Continue heparin infusion at 1,100 units/hr. Heparin level in 6 hours and daily. Monitor H&H and s/s of bleeding.   77 08/12/2020,8:47 AM

## 2020-08-12 NOTE — Progress Notes (Addendum)
PROGRESS NOTE    Sarah Bonilla  QHU:[id number] DOB: 1955/06/03 DOA: 08/10/2020 PCP: Patient, No Pcp Per   Brief Narrative:   Sarah Bonilla is a 65 y.o. female with medical history significant for poorly controlled hypertension due to medication and dietary noncompliance, tobacco abuse with COPD, chronic diastolic congestive heart failure, and chronic left lower extremity DVT who presented to the ED with complaints of some leg swelling over the last 1 month which started after she stubbed her left great toe.   Patient was admitted with hypertensive crisis and new onset atrial flutter which appeared to be improved this morning.  She is also noted to have left great toe gangrene with ABI demonstrating moderate PAD.  General surgery feels that patient likely has osteomyelitis and recommends transfer to [facility] for vascular evaluation.  Discussed case with Dr. Darrick Penna who will see in transfer.  Assessment & Plan:   Active Problems:   Hypertensive emergency   Ulcer of great toe, left, with unspecified severity (HCC)   Dry gangrene (HCC)   Hypertensive crisis-resolved -Continues to have high bp readings -Likely secondary to dietary/medication noncompliance -Patient resumed on home medications and Cardene has been weaned off -IV labetalol as needed -Appreciate Cardiology management with addition of aldactone, increase of hydralazine and Lasix today  Atrial flutter-currently in sinus rhythm -Appears to be of new onset -Plan to obtain limited 2D echocardiogram with prior on 04/2020 with LVEF 55-60% and grade 3 diastolic dysfunction -CHADs2VASc of 4 and will continue heparin drip for now -Check TSH -Appreciate cardiology recommendations  Hypokalemia-improving -Replete and reevaluate  Left great toe pain likely secondary to cellulitis/dry gangrene in the setting of moderate PAD -Maintain on vancomycin as ordered -Appreciate general surgery consultation -ABI with moderate  PAD -Vascular surgery aware and will see on transfer  History of COPD with tobacco abuse -No acute exacerbation noted -Counseled on tobacco cessation -As needed bronchodilators -PFTs outpatient  Chronic left lower extremity DVT -Chronic anticoagulation not started due to issues with chronic anemia -Also not started on medication due to noncompliance issues -Will need hematology follow-up outpatient -Currently on heparin drip, plan to discharge on Eliquis -CT angiogram negative for PE  Chronic anemia-stable -Hematology follow-up outpatient as noted above -Follow CBC -No overt bleeding noted   DVT prophylaxis:Heparin drip Code Status: Full Family Communication: Discussed with daughter on phone 3/14 Disposition Plan:  Status is: Inpatient  Remains inpatient appropriate because:IV treatments appropriate due to intensity of illness or inability to take PO and Inpatient level of care appropriate due to severity of illness   Dispo: The patient is from: Home              Anticipated d/c is to: Home              Patient currently is not medically stable to d/c.   Difficult to place patient No   Consultants:   Cardiology  General surgery  Discussed with Dr. Darrick Penna with vascular surgery  Procedures:   See below  Antimicrobials:  Anti-infectives (From admission, onward)   Start     Dose/Rate Route Frequency Ordered Stop   08/11/20 2200  vancomycin (VANCOREADY) IVPB 750 mg/150 mL        750 mg 150 mL/hr over 60 Minutes Intravenous Every 24 hours 08/11/20 0019     08/11/20 0015  vancomycin (VANCOCIN) IVPB 1000 mg/200 mL premix        1,000 mg 200 mL/hr over 60 Minutes Intravenous  Once 08/11/20 0012  08/11/20 0306   08/10/20 2345  cefTRIAXone (ROCEPHIN) 1 g in sodium chloride 0.9 % 100 mL IVPB        1 g 200 mL/hr over 30 Minutes Intravenous  Once 08/10/20 2340 08/11/20 0100       Subjective: Patient seen and evaluated today with no new acute complaints or  concerns. No acute concerns or events noted overnight. She states her toe is feeling better today and she can move it better today. Bp is slightly improved.  Objective: Vitals:   08/12/20 0900 08/12/20 0930 08/12/20 1000 08/12/20 1030  BP: (!) 170/76 (!) 154/84 (!) 175/75 (!) 170/95  Pulse: 94 88 89 94  Resp: (!) 30 (!) 23 (!) 28 (!) 29  Temp:      TempSrc:      SpO2: 99% 100% 98% 100%  Weight:      Height:        Intake/Output Summary (Last 24 hours) at 08/12/2020 1051 Last data filed at 08/12/2020 0726 Gross per 24 hour  Intake 1221.07 ml  Output --  Net 1221.07 ml   Filed Weights   08/10/20 1945  Weight: 44.7 kg    Examination:  General exam: Appears calm and comfortable  Respiratory system: Clear to auscultation. Respiratory effort normal. Cardiovascular system: S1 & S2 heard, RRR. Tachycardia  Gastrointestinal system: Abdomen is soft Central nervous system: Alert and awake Extremities: No edema Skin: L toe with gangrene as noted previously Psychiatry: Flat affect.    Data Reviewed: I have personally reviewed following labs and imaging studies  CBC: Recent Labs  Lab 08/10/20 2207 08/12/20 0802  WBC 7.3 12.0*  HGB 10.0* 9.3*  HCT 30.5* 29.5*  MCV 91.9 93.7  PLT 183 176   Basic Metabolic Panel: Recent Labs  Lab 08/10/20 2207 08/10/20 2352 08/11/20 0552 08/12/20 0802  NA 142  --  142 144  K 2.4*  --  2.5* 3.2*  CL 106  --  101 109  CO2 25  --  29 23  GLUCOSE 105*  --  102* 110*  BUN 23  --  18 18  CREATININE 0.70  --  0.51 0.47  CALCIUM 9.2  --  9.0 9.2  MG  --  1.7  --  1.7   GFR: Estimated Creatinine Clearance: 50.1 mL/min (by C-G formula based on SCr of 0.47 mg/dL). Liver Function Tests: No results for input(s): AST, ALT, ALKPHOS, BILITOT, PROT, ALBUMIN in the last 168 hours. No results for input(s): LIPASE, AMYLASE in the last 168 hours. No results for input(s): AMMONIA in the last 168 hours. Coagulation Profile: No results for input(s):  INR, PROTIME in the last 168 hours. Cardiac Enzymes: No results for input(s): CKTOTAL, CKMB, CKMBINDEX, TROPONINI in the last 168 hours. BNP (last 3 results) No results for input(s): PROBNP in the last 8760 hours. HbA1C: Recent Labs    08/10/20 2208  HGBA1C 5.1   CBG: No results for input(s): GLUCAP in the last 168 hours. Lipid Profile: No results for input(s): CHOL, HDL, LDLCALC, TRIG, CHOLHDL, LDLDIRECT in the last 72 hours. Thyroid Function Tests: No results for input(s): TSH, T4TOTAL, FREET4, T3FREE, THYROIDAB in the last 72 hours. Anemia Panel: No results for input(s): VITAMINB12, FOLATE, FERRITIN, TIBC, IRON, RETICCTPCT in the last 72 hours. Sepsis Labs: Recent Labs  Lab 08/10/20 2208 08/10/20 2352  LATICACIDVEN 1.1 1.5    Recent Results (from the past 240 hour(s))  Blood culture (routine x 2)     Status: None (Preliminary result)  Collection Time: 08/10/20 11:53 PM   Specimen: BLOOD RIGHT FOREARM  Result Value Ref Range Status   Specimen Description BLOOD RIGHT FOREARM  Final   Special Requests   Final    BOTTLES DRAWN AEROBIC AND ANAEROBIC Blood Culture adequate volume   Culture   Final    NO GROWTH 1 DAY Performed at [facility], [address], [place], [place] [address]    Report Status PENDING  Incomplete  Blood culture (routine x 2)     Status: None (Preliminary result)   Collection Time: 08/11/20 12:00 AM   Specimen: BLOOD RIGHT ARM  Result Value Ref Range Status   Specimen Description BLOOD RIGHT ARM  Final   Special Requests   Final    BOTTLES DRAWN AEROBIC AND ANAEROBIC Blood Culture results may not be optimal due to an excessive volume of blood received in culture bottles   Culture   Final    NO GROWTH 1 DAY Performed at [facility], [address], [place], [place] [address]    Report Status PENDING  Incomplete  Resp Panel by RT-PCR (Flu A&B, Covid) Nasopharyngeal Swab     Status: None   Collection Time: 08/11/20  1:07 AM   Specimen:  Nasopharyngeal Swab; Nasopharyngeal(NP) swabs in vial transport medium  Result Value Ref Range Status   SARS Coronavirus 2 by RT PCR NEGATIVE NEGATIVE Final    Comment: (NOTE) SARS-CoV-2 target nucleic acids are NOT DETECTED.  The SARS-CoV-2 RNA is generally detectable in upper respiratory specimens during the acute phase of infection. The lowest concentration of SARS-CoV-2 viral copies this assay can detect is 138 copies/mL. A negative result does not preclude SARS-Cov-2 infection and should not be used as the sole basis for treatment or other patient management decisions. A negative result may occur with  improper specimen collection/handling, submission of specimen other than nasopharyngeal swab, presence of viral mutation(s) within the areas targeted by this assay, and inadequate number of viral copies(<138 copies/mL). A negative result must be combined with clinical observations, patient history, and epidemiological information. The expected result is Negative.  Fact Sheet for Patients:  BloggerCourse.com  Fact Sheet for Healthcare Providers:  SeriousBroker.it  This test is no t yet approved or cleared by the [place] FDA and  has been authorized for detection and/or diagnosis of SARS-CoV-2 by FDA under an Emergency Use Authorization (EUA). This EUA will remain  in effect (meaning this test can be used) for the duration of the COVID-19 declaration under Section 564(b)(1) of the Act, 21 U.S.C.section 360bbb-3(b)(1), unless the authorization is terminated  or revoked sooner.       Influenza A by PCR NEGATIVE NEGATIVE Final   Influenza B by PCR NEGATIVE NEGATIVE Final    Comment: (NOTE) The Xpert Xpress SARS-CoV-2/FLU/RSV plus assay is intended as an aid in the diagnosis of influenza from Nasopharyngeal swab specimens and should not be used as a sole basis for treatment. Nasal washings and aspirates are unacceptable for  Xpert Xpress SARS-CoV-2/FLU/RSV testing.  Fact Sheet for Patients: BloggerCourse.com  Fact Sheet for Healthcare Providers: SeriousBroker.it  This test is not yet approved or cleared by the [place] FDA and has been authorized for detection and/or diagnosis of SARS-CoV-2 by FDA under an Emergency Use Authorization (EUA). This EUA will remain in effect (meaning this test can be used) for the duration of the COVID-19 declaration under Section 564(b)(1) of the Act, 21 U.S.C. section 360bbb-3(b)(1), unless the authorization is terminated or revoked.  Performed at [facility]  [facility], [address], [place], [place] [address]   MRSA PCR Screening     Status: None   Collection Time: 08/11/20  9:12 AM   Specimen: Nasal Mucosa; Nasopharyngeal  Result Value Ref Range Status   MRSA by PCR NEGATIVE NEGATIVE Final    Comment:        The GeneXpert MRSA Assay (FDA approved for NASAL specimens only), is one component of a comprehensive MRSA colonization surveillance program. It is not intended to diagnose MRSA infection nor to guide or monitor treatment for MRSA infections. Performed at [facility], [address] [place], [place] [address]          Radiology Studies: CT Angio Chest PE W and/or Wo Contrast  Result Date: 08/11/2020 CLINICAL DATA:  Pulmonary embolus suspected. Swelling left lower extremity. Injured for months. EXAM: CT ANGIOGRAPHY CHEST WITH CONTRAST TECHNIQUE: Multidetector CT imaging of the chest was performed using the standard protocol during bolus administration of intravenous contrast. Multiplanar CT image reconstructions and MIPs were obtained to evaluate the vascular anatomy. CONTRAST:  OMNIPAQUE IOHEXOL 350 MG/ML SOLN COMPARISON:  Chest x-ray 08/10/2020 FINDINGS: Cardiovascular: Satisfactory opacification of the pulmonary arteries to the segmental level. No evidence of pulmonary embolism. The main pulmonary  artery is normal in caliber. Mild atherosclerotic plaque of the thoracic aorta. severe left anterior descending left circumflex as well as at least mild right main coronary artery calcifications. Mediastinum/Nodes: No enlarged mediastinal, hilar, or axillary lymph nodes. Thyroid gland, trachea, and esophagus demonstrate no significant findings. Lungs/Pleura: Centrilobular emphysematous changes. Bilateral lower lobe subsegmental atelectasis. Limited evaluation for pulmonary nodularity due to motion artifact. No pulmonary mass. No focal consolidation. No pleural effusion. No pneumothorax. Upper Abdomen: Status post cholecystectomy. Atherosclerotic plaque. No acute abnormality. Musculoskeletal: No chest wall abnormality. Likely bone island of the left posterior eighth rib. No suspicious lytic or blastic osseous lesions. No acute displaced fracture. Review of the MIP images confirms the above findings. IMPRESSION: 1. No central or segmental pulmonary embolus. Limited evaluation at the subsegmental level due to motion artifact. 2. No acute intrapulmonary abnormality. 3. Aortic Atherosclerosis (ICD10-I70.0) including 3 vessel mild to severe coronary artery calcification. 4.  Emphysema (ICD10-J43.9). Electronically Signed   By: Tish Frederickson M.D.   On: 08/11/2020 04:06   DG Chest Portable 1 View  Result Date: 08/10/2020 CLINICAL DATA:  Hypoxia EXAM: PORTABLE CHEST 1 VIEW COMPARISON:  04/19/2020 FINDINGS: The lungs are hyperinflated with diffuse interstitial prominence. No focal airspace consolidation or pulmonary edema. No pleural effusion or pneumothorax. Normal cardiomediastinal contours. IMPRESSION: COPD without acute airspace disease. Electronically Signed   By: Deatra Robinson M.D.   On: 08/10/2020 23:58   DG Foot Complete Left  Result Date: 08/10/2020 CLINICAL DATA:  Left great toe swelling. EXAM: LEFT FOOT - COMPLETE 3+ VIEW COMPARISON:  None. FINDINGS: There is no evidence of fracture or dislocation. There  is diffuse osteopenia with mild degenerative changes seen along the dorsal aspect of the proximal to mid left foot. Marked severity soft tissue swelling is seen throughout the left great toe. IMPRESSION: Marked severity soft tissue swelling throughout the left great toe without evidence of an acute osseous abnormality. Electronically Signed   By: Aram Candela M.D.   On: 08/10/2020 22:10        Scheduled Meds:  amLODipine  10 mg Oral Daily   carvedilol  25 mg Oral BID WC   Chlorhexidine Gluconate Cloth  6 each Topical Daily   hydrALAZINE  75 mg Oral Q8H  lisinopril  40 mg Oral Daily   pantoprazole  40 mg Oral Daily   potassium chloride  40 mEq Oral BID   sodium chloride flush  3 mL Intravenous Q12H   Continuous Infusions:  sodium chloride Stopped (08/11/20 2249)   diltiazem (CARDIZEM) infusion Stopped (08/11/20 2100)   heparin 1,100 Units/hr (08/12/20 0726)   vancomycin Stopped (08/11/20 2203)     LOS: 1 day    Time spent: 35 minutes    Ellwyn Ergle Hoover Brunette Lavonya Hoerner, DO [facility]  If 7PM-7AM, please contact night-coverage [email or web address] 08/12/2020, 10:51 AM

## 2020-08-12 NOTE — Progress Notes (Signed)
*  PRELIMINARY RESULTS* Echocardiogram 2D Echocardiogram has been performed.  Stacey Drain 08/12/2020, 2:24 PM

## 2020-08-12 NOTE — Consult Note (Addendum)
Cardiology Consultation:   Patient ID: [name] MRN: [id number]; DOB: 1955-10-20  Admit date: 08/10/2020 Date of Consult: 08/12/2020  PCP:  Patient, No Pcp Per   [facility]  Cardiologist:  Dina Rich, MD  Advanced Practice Provider:  No care team member to display Electrophysiologist:  None  [phone]    Patient Profile:   Sarah Bonilla is a 65 y.o. female with a hx of uncontrolled HTN who is being seen today for the evaluation of HTN emergency and atrial flutter at the request of Dr. Sherryll Burger.  History of Present Illness:   Sarah Bonilla is a 65 yo female who we saw in consult for HTN emergency and diastolic CHF,  04/2020. BP had been untreated at that time. troponins were elevated but no EKG changes and echo normal LVEF but mod LVH grade 3 DD. Was treated with amlodipine, coreg, hydralazine and lisinopril. Also has chronic LLE DVT.  Patient returns now with HTN emergency and left great toe gangrene with symptoms of SOB,  leg swelling and toe pain. Doesn't always take her meds. EKG showed Aflutter, K 2.5, Hbg 10. She says she only notices her heart race when she lays down and gets upset. Not very active and no symptoms with moving around. Smokes 1ppd. Misses days of meds. Has lost 25 lbs in past 5 yrs since her son died. Eats out mostly, also canned meats/foods. Doesn't check her BP-says she knows when it's high. Knows she can't live alone anymore.   Past Medical History:  Diagnosis Date  . Hypertension     Past Surgical History:  Procedure Laterality Date  . CHOLECYSTECTOMY       Home Medications:  Prior to Admission medications   Medication Sig Start Date End Date Taking? Authorizing Provider  acetaminophen (TYLENOL) 325 MG tablet Take 650 mg by mouth every 6 (six) hours as needed for moderate pain or headache.   Yes [provider]  amLODipine (NORVASC) 10 MG tablet Take 1 tablet (10 mg total) by mouth daily. 04/25/20  Yes  Vassie Loll, MD  carvedilol (COREG) 25 MG tablet Take 1 tablet (25 mg total) by mouth 2 (two) times daily with a meal. 04/24/20  Yes Vassie Loll, MD  hydrALAZINE (APRESOLINE) 25 MG tablet Take 3 tablets (75 mg total) by mouth every 8 (eight) hours. 04/24/20  Yes Vassie Loll, MD  ibuprofen (ADVIL) 200 MG tablet Take 400 mg by mouth every 6 (six) hours as needed for mild pain.   Yes [provider]  lisinopril (ZESTRIL) 40 MG tablet Take 1 tablet (40 mg total) by mouth daily. 04/25/20  Yes Vassie Loll, MD  pantoprazole (PROTONIX) 40 MG tablet Take 1 tablet (40 mg total) by mouth daily. 04/25/20  Yes Vassie Loll, MD    Inpatient Medications: Scheduled Meds: . amLODipine  10 mg Oral Daily  . carvedilol  25 mg Oral BID WC  . Chlorhexidine Gluconate Cloth  6 each Topical Daily  . hydrALAZINE  75 mg Oral Q8H  . lisinopril  40 mg Oral Daily  . pantoprazole  40 mg Oral Daily  . sodium chloride flush  3 mL Intravenous Q12H   Continuous Infusions: . sodium chloride Stopped (08/11/20 2249)  . diltiazem (CARDIZEM) infusion Stopped (08/11/20 2100)  . heparin 1,100 Units/hr (08/12/20 0726)  . vancomycin Stopped (08/11/20 2203)   PRN Meds: sodium chloride, acetaminophen **OR** acetaminophen, labetalol, levalbuterol, ondansetron **OR** ondansetron (ZOFRAN) IV, sodium chloride flush  Allergies:   No Known  Allergies  Social History:   Social History   Socioeconomic History  . Marital status: Widowed    Spouse name: Not on file  . Number of children: Not on file  . Years of education: Not on file  . Highest education level: Not on file  Occupational History  . Not on file  Tobacco Use  . Smoking status: Current Every Day Smoker    Packs/day: 1.00    Types: Cigarettes  . Smokeless tobacco: Never Used  Vaping Use  . Vaping Use: Never used  Substance and Sexual Activity  . Alcohol use: Yes    Comment: occasionally   . Drug use: Never  . Sexual activity: Not on file   Other Topics Concern  . Not on file  Social History Narrative  . Not on file   Social Determinants of Health   Financial Resource Strain: Not on file  Food Insecurity: Not on file  Transportation Needs: Not on file  Physical Activity: Not on file  Stress: Not on file  Social Connections: Not on file  Intimate Partner Violence: Not on file    Family History:     Family History  Problem Relation Age of Onset  . CVA Mother   . Hypertension Father      ROS:  Please see the history of present illness.  Review of Systems  Constitutional: Positive for weight loss.  HENT: Negative.   Eyes: Negative.   Cardiovascular: Positive for irregular heartbeat, leg swelling and palpitations.  Respiratory: Positive for shortness of breath.   Hematologic/Lymphatic: Negative.   Musculoskeletal: Positive for joint pain.  Gastrointestinal: Negative.   Genitourinary: Negative.   Neurological: Negative.    All other ROS reviewed and negative.     Physical Exam/Data:   Vitals:   08/12/20 0639 08/12/20 0700 08/12/20 0702 08/12/20 0740  BP: (!) 210/90 (!) 208/91 (!) 208/91   Pulse: (!) 103 97    Resp: (!) 29 (!) 23    Temp:    99.3 F (37.4 C)  TempSrc:    Oral  SpO2: 100% 99%    Weight:      Height:        Intake/Output Summary (Last 24 hours) at 08/12/2020 0834 Last data filed at 08/12/2020 0726 Gross per 24 hour  Intake 1561.07 ml  Output 450 ml  Net 1111.07 ml   Last 3 Weights 08/10/2020 04/23/2020 04/22/2020  Weight (lbs) 98 lb 8 oz 88 lb 2.9 oz 94 lb 5.7 oz  Weight (kg) 44.679 kg 40 kg 42.8 kg     Body mass index is 18.02 kg/m.  General:  Thin, looks older than stated age, in no acute distress HEENT: normal Lymph: no adenopathy Neck: no JVD Endocrine:  No thryomegaly Vascular: No carotid bruits; FA pulses 2+ bilaterally without bruits  Cardiac:  normal S1, S2; RRR; no murmur   Lungs:  Decreased breath sounds but clear to auscultation bilaterally, no wheezing, rhonchi  or rales  Abd: soft, nontender, no hepatomegaly  Ext: no edema Musculoskeletal:  LLE great toe gangrene, BUE and BLE strength normal and equal Skin: warm and dry  Neuro:  CNs 2-12 intact, no focal abnormalities noted Psych:  Normal affect   EKG:  The EKG was personally reviewed and demonstrates:  Initial EKG NSR with LVH then repeat Aflutter 149/m with LVH and STT changes Telemetry:  Telemetry was personally reviewed and demonstrates:  Aflutter converted to Afib and now NSR with PVC's  Relevant CV Studies:  Echocardiogram: 04/19/2020 IMPRESSIONS     1. Left ventricular ejection fraction, by estimation, is 55 to 60%. The  left ventricle has normal function. The left ventricle has no regional  wall motion abnormalities. There is moderate left ventricular hypertrophy.  Left ventricular diastolic  parameters are consistent with Grade III diastolic dysfunction  (restrictive). Elevated left atrial pressure.   2. Right ventricular systolic function is normal. The right ventricular  size is mildly enlarged. There is severely elevated pulmonary artery  systolic pressure.   3. Left atrial size was severely dilated.   4. Right atrial size was severely dilated.   5. The mitral valve is normal in structure. Mild mitral valve  regurgitation. No evidence of mitral stenosis.   6. The aortic valve is tricuspid. Aortic valve regurgitation is not  visualized. No aortic stenosis is present.   7. The inferior vena cava is dilated in size with <50% respiratory  variability, suggesting right atrial pressure of 15 mmHg.   Laboratory Data:  High Sensitivity Troponin:  No results for input(s): TROPONINIHS in the last 720 hours.   Chemistry Recent Labs  Lab 08/10/20 2207 08/11/20 0552  NA 142 142  K 2.4* 2.5*  CL 106 101  CO2 25 29  GLUCOSE 105* 102*  BUN 23 18  CREATININE 0.70 0.51  CALCIUM 9.2 9.0  GFRNONAA >60 >60  ANIONGAP 11 12    No results for input(s): PROT, ALBUMIN, AST, ALT,  ALKPHOS, BILITOT in the last 168 hours. Hematology Recent Labs  Lab 08/10/20 2207 08/12/20 0802  WBC 7.3 12.0*  RBC 3.32* 3.15*  HGB 10.0* 9.3*  HCT 30.5* 29.5*  MCV 91.9 93.7  MCH 30.1 29.5  MCHC 32.8 31.5  RDW 15.5 15.9*  PLT 183 176   BNP Recent Labs  Lab 08/10/20 2352  BNP 2,259.0*    DDimer  Recent Labs  Lab 08/10/20 2352  DDIMER 0.70*     Radiology/Studies:  CT Angio Chest PE W and/or Wo Contrast  Result Date: 08/11/2020 CLINICAL DATA:  Pulmonary embolus suspected. Swelling left lower extremity. Injured for months. EXAM: CT ANGIOGRAPHY CHEST WITH CONTRAST TECHNIQUE: Multidetector CT imaging of the chest was performed using the standard protocol during bolus administration of intravenous contrast. Multiplanar CT image reconstructions and MIPs were obtained to evaluate the vascular anatomy. CONTRAST:  100mL OMNIPAQUE IOHEXOL 350 MG/ML SOLN COMPARISON:  Chest x-ray 08/10/2020 FINDINGS: Cardiovascular: Satisfactory opacification of the pulmonary arteries to the segmental level. No evidence of pulmonary embolism. The main pulmonary artery is normal in caliber. Mild atherosclerotic plaque of the thoracic aorta. severe left anterior descending left circumflex as well as at least mild right main coronary artery calcifications. Mediastinum/Nodes: No enlarged mediastinal, hilar, or axillary lymph nodes. Thyroid gland, trachea, and esophagus demonstrate no significant findings. Lungs/Pleura: Centrilobular emphysematous changes. Bilateral lower lobe subsegmental atelectasis. Limited evaluation for pulmonary nodularity due to motion artifact. No pulmonary mass. No focal consolidation. No pleural effusion. No pneumothorax. Upper Abdomen: Status post cholecystectomy. Atherosclerotic plaque. No acute abnormality. Musculoskeletal: No chest wall abnormality. Likely bone island of the left posterior eighth rib. No suspicious lytic or blastic osseous lesions. No acute displaced fracture. Review of  the MIP images confirms the above findings. IMPRESSION: 1. No central or segmental pulmonary embolus. Limited evaluation at the subsegmental level due to motion artifact. 2. No acute intrapulmonary abnormality. 3. Aortic Atherosclerosis (ICD10-I70.0) including 3 vessel mild to severe coronary artery calcification. 4.  Emphysema (ICD10-J43.9). Electronically Signed   By: Normajean GlasgowMorgane  Naveau M.D.  On: 08/11/2020 04:06   DG Chest Portable 1 View  Result Date: 08/10/2020 CLINICAL DATA:  Hypoxia EXAM: PORTABLE CHEST 1 VIEW COMPARISON:  04/19/2020 FINDINGS: The lungs are hyperinflated with diffuse interstitial prominence. No focal airspace consolidation or pulmonary edema. No pleural effusion or pneumothorax. Normal cardiomediastinal contours. IMPRESSION: COPD without acute airspace disease. Electronically Signed   By: Deatra Robinson M.D.   On: 08/10/2020 23:58   DG Foot Complete Left  Result Date: 08/10/2020 CLINICAL DATA:  Left great toe swelling. EXAM: LEFT FOOT - COMPLETE 3+ VIEW COMPARISON:  None. FINDINGS: There is no evidence of fracture or dislocation. There is diffuse osteopenia with mild degenerative changes seen along the dorsal aspect of the proximal to mid left foot. Marked severity soft tissue swelling is seen throughout the left great toe. IMPRESSION: Marked severity soft tissue swelling throughout the left great toe without evidence of an acute osseous abnormality. Electronically Signed   By: Aram Candela M.D.   On: 08/10/2020 22:10     Assessment and Plan:   Hypertensive Emergency due to medication and dietary noncompliance-BP still up on amlodipine 10 mg coreg 25 bid, hydralazine 75 q8, zestril 40 mg AND diltiazem      Aflutter/Afib new onset in setting of above and gangrene. CHADS2VASc=4. Initial EKG on arrival NSR then went into rapid Aflutter followed by Afib and now NSR. She has palpitations at home so suspect she's going in/out of it. On IV heparin now. Will need Eliquis. Says she's  going to live with her daughter so hopefully compliance will be better. Now off IV diltiazem  Acute on chronic diastolic CHF BNP 6606 on admission and I/O's -226 cc since admit.  LImited echo ordered for today   Chronic LLE DVT chronic anticoagulation not started previously due to chronic anemia and noncompliance. 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 ABI's today  COPD with ongoing tobacco abuse. Cessation discussed  Hypokalemia K 2.5 being replaced   Risk Assessment/Risk Scores:          CHA2DS2-VASc Score = 4  This indicates a 4.8% annual risk of stroke. The patient's score is based upon: CHF History: Yes HTN History: Yes Diabetes History: No Stroke History: No Vascular Disease History: Yes Age Score: 0 Gender Score: 1         For questions or updates, please contact [facility] Please consult [email or web address] for contact info under    Signed, Sarah Reedy, PA-C  08/12/2020 8:34 AM   Patient seen and examined    I have reviewed findings as noted above by Sarah Bonilla  Pt is a 65 yo with hx of HTN and noncompliance     Admitted yesterday with SOB, LE edema and toe pain. The patient denied    Being evaluated for gangrene of L toe   ON admit, BP exptremely high, upt to 270/120     Pt also developed atrial flutter (atypical)  Converted back on into SR    Currently the pt deneis CP   Says her breathing is OK (on 4L)  Neck:  JVP is increased to below jaw   No bruits Lungs with rales at bases Cardiac exam:   RRR  No signif murmurs   NO S3 Abd is supple   No hepatomegaly or masses Ext are with trace edeam    Pt with sock on , having USN study done (dopplers)  Deferred exam of feet.  EKG on admit:   SR   LVH  with repol abnormality   89 bpm # 2   Atrial flutter with 2:1 conduction (atypical)   LVH with repol abnormality   1.   Atrial flutter.   She is back in SR now    WOuld place on rate controlling meds and  IV heparin    Would continue  telemetry    Agree with echo  2  HTN   Pt's BP remains very high  ( a few low readings in chart, ? Accuracy).   She is on lisinopril, amlodipine and hydralazine, carvedilol AND diltiazem IV   With K low ? Primary Aldosteronism    Has not had scan for eval of adrenals    For now would expand Rx:   I would recomm aldactone 25 mg for now    Would also increase hydralazine to 100 tid      Continue other medicines for now   Follow K    3   SOB/Edema  Pt's JVP is elevated   With that and mild rales and trib edema  I would give IV lasix x 1   Follow K     REplete as neede  Echo pending    4  Hx gangrene   Being eval by surgery   ABIs in progress  5  Hx LE DVT   On heparin  Will continue to follow Pt critically ill.  Dietrich Pates MD

## 2020-08-12 NOTE — Progress Notes (Signed)
ANTICOAGULATION CONSULT NOTE  Pharmacy Consult for heparin  Indication: atrial fibrillation  No Known Allergies  Patient Measurements: Height: 5\' 2"  (157.5 cm) Weight: 44.7 kg (98 lb 8 oz) IBW/kg (Calculated) : 50.1 Heparin Dosing Weight: HEPARIN DW (KG): 44.7   Vital Signs: Temp: 98.3 F (36.8 C) (03/14 1208) Temp Source: Oral (03/14 1208) BP: 178/77 (03/14 1600) Pulse Rate: 91 (03/14 1600)  Labs: Recent Labs    08/10/20 2207 08/11/20 0552 08/11/20 1446 08/11/20 2220 08/12/20 0802 08/12/20 1527  HGB 10.0*  --   --   --  9.3*  --   HCT 30.5*  --   --   --  29.5*  --   PLT 183  --   --   --  176  --   HEPARINUNFRC  --   --    < > 0.21* 0.38 0.32  CREATININE 0.70 0.51  --   --  0.47  --    < > = values in this interval not displayed.    Estimated Creatinine Clearance: 50.1 mL/min (by C-G formula based on SCr of 0.47 mg/dL).   Assessment: 65 y.o. female with Afib for heparin  Heparin level 0.32: therapeutic   Goal of Therapy:  Heparin level 0.3-0.7 units/ml Monitor platelets by anticoagulation protocol: Yes   Plan:  Continue heparin infusion at 1,100 units/hr. Heparin level daily. Monitor H&H and s/s of bleeding.   77 08/12/2020,4:59 PM

## 2020-08-12 NOTE — TOC Progression Note (Signed)
Transition of Care [facility]) - Progression Note    Patient Details  Name: Sarah Bonilla MRN: [id number] Date of Birth: 06/12/1955  Transition of Care [facility]) CM/SW Contact  Karn Cassis, [place] Phone Number: 08/12/2020, 8:22 AM  Clinical Narrative:  LCSW noted no insurance/no PCP. Discussed with pt who is agreeable to refer to [facility] for PCP and financial counselor to see if eligible for Medicaid.  Referrals made. Per notes, pt was referred to [facility] in November, but pt states she didn't follow up at the time. She indicates she can afford her current medications. No other needs reported at this time. TOC to follow.          Expected Discharge Plan and Services                                                 Social Determinants of Health (SDOH) Interventions    Readmission Risk Interventions No flowsheet data found.

## 2020-08-12 NOTE — Progress Notes (Signed)
Called by nurse requesting neb tx for patient. Patient sitting on side of bed on 4 liters complaining she could not breath. Breath sounds are very decreased with little air movement. Saturation 88. Patient given neb treatment with albuterol 5.0mg . At end of neb patient is wheezing. Patient given additional neb with 5.o mg albuterol / atrovent 0.5mg . Oxygen is increased to 6 liters. Patient stating she feels better. Patient  still smokes and has family hx of asthma ( father). Duoneb has been order tid scheduled. Albuterol prn

## 2020-08-12 NOTE — Progress Notes (Signed)
[facility]  ABI abnormal, tissue loss /dry gangrene around left great toe with potential exposed distal phalanx on exam.  Dr. Sherryll Burger has discussed with Vascular and they will transfer for further evaluation and management.  Appreciate assistance. Updated patient.   Algis Greenhouse, MD [facility] [address] Vella Raring [place], [place] [address] [phone] (office)

## 2020-08-13 ENCOUNTER — Other Ambulatory Visit: Payer: Self-pay

## 2020-08-13 DIAGNOSIS — I96 Gangrene, not elsewhere classified: Secondary | ICD-10-CM

## 2020-08-13 DIAGNOSIS — I739 Peripheral vascular disease, unspecified: Secondary | ICD-10-CM

## 2020-08-13 DIAGNOSIS — I4892 Unspecified atrial flutter: Secondary | ICD-10-CM

## 2020-08-13 DIAGNOSIS — I5033 Acute on chronic diastolic (congestive) heart failure: Secondary | ICD-10-CM

## 2020-08-13 DIAGNOSIS — I161 Hypertensive emergency: Secondary | ICD-10-CM

## 2020-08-13 LAB — CBC
HCT: 29 % — ABNORMAL LOW (ref 36.0–46.0)
Hemoglobin: 9.3 g/dL — ABNORMAL LOW (ref 12.0–15.0)
MCH: 29.6 pg (ref 26.0–34.0)
MCHC: 32.1 g/dL (ref 30.0–36.0)
MCV: 92.4 fL (ref 80.0–100.0)
Platelets: 170 10*3/uL (ref 150–400)
RBC: 3.14 MIL/uL — ABNORMAL LOW (ref 3.87–5.11)
RDW: 15.9 % — ABNORMAL HIGH (ref 11.5–15.5)
WBC: 10.5 10*3/uL (ref 4.0–10.5)
nRBC: 0 % (ref 0.0–0.2)

## 2020-08-13 LAB — BASIC METABOLIC PANEL
Anion gap: 10 (ref 5–15)
BUN: 19 mg/dL (ref 8–23)
CO2: 25 mmol/L (ref 22–32)
Calcium: 9.4 mg/dL (ref 8.9–10.3)
Chloride: 108 mmol/L (ref 98–111)
Creatinine, Ser: 0.61 mg/dL (ref 0.44–1.00)
GFR, Estimated: >60 mL/min (ref >60)
Glucose, Bld: 115 mg/dL — ABNORMAL HIGH (ref 70–99)
Potassium: 3.4 mmol/L — ABNORMAL LOW (ref 3.5–5.1)
Sodium: 143 mmol/L (ref 135–145)

## 2020-08-13 LAB — MAGNESIUM: Magnesium: 1.7 mg/dL (ref 1.7–2.4)

## 2020-08-13 LAB — HEPARIN LEVEL (UNFRACTIONATED)
Heparin Unfractionated: 0.23 IU/mL — ABNORMAL LOW (ref 0.30–0.70)
Heparin Unfractionated: 0.32 IU/mL (ref 0.30–0.70)

## 2020-08-13 MED ORDER — POTASSIUM CHLORIDE CRYS ER 20 MEQ PO TBCR
40.0000 meq | EXTENDED_RELEASE_TABLET | Freq: Once | ORAL | Status: AC
  Administered 2020-08-13: 40 meq via ORAL
  Filled 2020-08-13: qty 2

## 2020-08-13 MED ORDER — ISOSORBIDE MONONITRATE ER 30 MG PO TB24
30.0000 mg | ORAL_TABLET | Freq: Every day | ORAL | Status: DC
  Administered 2020-08-13 – 2020-08-14 (×2): 30 mg via ORAL
  Filled 2020-08-13 (×2): qty 1

## 2020-08-13 MED ORDER — SODIUM CHLORIDE 0.9 % IV SOLN
1.0000 g | INTRAVENOUS | Status: DC
  Administered 2020-08-13 – 2020-08-19 (×7): 1 g via INTRAVENOUS
  Filled 2020-08-13 (×9): qty 10

## 2020-08-13 MED ORDER — MAGNESIUM SULFATE 2 GM/50ML IV SOLN
2.0000 g | Freq: Once | INTRAVENOUS | Status: AC
  Administered 2020-08-13: 2 g via INTRAVENOUS
  Filled 2020-08-13: qty 50

## 2020-08-13 MED ORDER — METRONIDAZOLE 500 MG PO TABS
500.0000 mg | ORAL_TABLET | Freq: Three times a day (TID) | ORAL | Status: DC
  Administered 2020-08-13 – 2020-08-21 (×23): 500 mg via ORAL
  Filled 2020-08-13 (×23): qty 1

## 2020-08-13 NOTE — Consult Note (Signed)
Referring Physician: [facility]  Patient name: Sarah Bonilla MRN: [id number] DOB: February 20, 1956 Sex: female  REASON FOR CONSULT: Gangrene left first toe  HPI: Sarah Bonilla is a 65 y.o. female transferred from [facility] where she was admitted with hypertensive urgency.  Patient states her left first toe had been turning dark over the last several weeks.  She does not really complain of claudication symptoms in either leg.  She has never had any nonhealing wounds.  She does smoke about a pack of cigarettes per day.  She does not know she has a history of diabetes.  She does not think she has ever been tested.  She thinks she may have had some intermittent fever or chills.  She has had no significant drainage from the toe.   Past Medical History:  Diagnosis Date  . Hypertension    Past Surgical History:  Procedure Laterality Date  . CHOLECYSTECTOMY      Family History  Problem Relation Age of Onset  . CVA Mother   . Hypertension Father     SOCIAL HISTORY: Social History   Socioeconomic History  . Marital status: Widowed    Spouse name: Not on file  . Number of children: Not on file  . Years of education: Not on file  . Highest education level: Not on file  Occupational History  . Not on file  Tobacco Use  . Smoking status: Current Every Day Smoker    Packs/day: 1.00    Types: Cigarettes  . Smokeless tobacco: Never Used  Vaping Use  . Vaping Use: Never used  Substance and Sexual Activity  . Alcohol use: Yes    Comment: occasionally   . Drug use: Never  . Sexual activity: Not on file  Other Topics Concern  . Not on file  Social History Narrative  . Not on file   Social Determinants of Health   Financial Resource Strain: Not on file  Food Insecurity: Not on file  Transportation Needs: Not on file  Physical Activity: Not on file  Stress: Not on file  Social Connections: Not on file  Intimate Partner Violence: Not on file    No Known  Allergies  Current Facility-Administered Medications  Medication Dose Route Frequency Provider Last Rate Last Admin  . 0.9 %  sodium chloride infusion  250 mL Intravenous PRN Sherryll Burger, Pratik D, DO   Stopped at 08/11/20 2249  . acetaminophen (TYLENOL) tablet 650 mg  650 mg Oral Q6H PRN Maurilio Lovely D, DO   650 mg at 08/12/20 1740   Or  . acetaminophen (TYLENOL) suppository 650 mg  650 mg Rectal Q6H PRN Sherryll Burger, Pratik D, DO      . amLODipine (NORVASC) tablet 10 mg  10 mg Oral Daily Sherryll Burger, Pratik D, DO   10 mg at 08/12/20 0947  . carvedilol (COREG) tablet 25 mg  25 mg Oral BID WC Shah, Pratik D, DO   25 mg at 08/12/20 1634  . Chlorhexidine Gluconate Cloth 2 % PADS 6 each  6 each Topical Daily Adefeso, Oladapo, DO   6 each at 08/12/20 0948  . heparin ADULT infusion 100 units/mL (25000 units/250mL)  1,200 Units/hr Intravenous Continuous Maurilio Lovely D, DO 12 mL/hr at 08/13/20 0439 1,200 Units/hr at 08/13/20 0439  . hydrALAZINE (APRESOLINE) tablet 100 mg  100 mg Oral Q8H Pricilla Riffle, MD   100 mg at 08/13/20 0545  . labetalol (NORMODYNE) injection 10 mg  10 mg Intravenous Q6H  PRN Shah, Pratik D, DO   10 mg at 08/12/20 0701  . levalbuterol (XOPENEX) nebulizer solution 0.63 mg  0.63 mg Nebulization Q6H PRN Shah, Pratik D, DO      . lisinopril (ZESTRIL) tablet 40 mg  40 mg Oral Daily Shah, Pratik D, DO   40 mg at 08/12/20 0948  . morphine 2 MG/ML injection 2 mg  2 mg Intravenous Q4H PRN Bridges, Lindsay C, MD   2 mg at 08/13/20 0131  . ondansetron (ZOFRAN) tablet 4 mg  4 mg Oral Q6H PRN Shah, Pratik D, DO       Or  . ondansetron (ZOFRAN) injection 4 mg  4 mg Intravenous Q6H PRN Shah, Pratik D, DO      . pantoprazole (PROTONIX) EC tablet 40 mg  40 mg Oral Daily Shah, Pratik D, DO   40 mg at 08/12/20 0947  . potassium chloride SA (KLOR-CON) CR tablet 40 mEq  40 mEq Oral Once Alekh, Kshitiz, MD      . sodium chloride flush (NS) 0.9 % injection 3 mL  3 mL Intravenous Q12H Shah, Pratik D, DO   3 mL at 08/12/20  2151  . sodium chloride flush (NS) 0.9 % injection 3 mL  3 mL Intravenous PRN Shah, Pratik D, DO      . spironolactone (ALDACTONE) tablet 25 mg  25 mg Oral Daily Ross, Paula V, MD   25 mg at 08/12/20 1125  . vancomycin (VANCOREADY) IVPB 750 mg/150 mL  750 mg Intravenous Q24H Shah, Pratik D, DO 150 mL/hr at 08/12/20 2141 750 mg at 08/12/20 2141    ROS:   General:  No weight loss, Fever, chills  HEENT: No recent headaches, no nasal bleeding, no visual changes, no sore throat  Neurologic: No dizziness, blackouts, seizures. No recent symptoms of stroke or mini- stroke. No recent episodes of slurred speech, or temporary blindness.  Cardiac: No recent episodes of chest pain/pressure, no shortness of breath at rest.  No shortness of breath with exertion.  Denies history of atrial fibrillation or irregular heartbeat  Vascular: No history of rest pain in feet.  No history of claudication.  No history of non-healing ulcer, No history of DVT   Pulmonary: No home oxygen, no productive cough, no hemoptysis,  No asthma or wheezing  Musculoskeletal:  [ ] Arthritis, [ ] Low back pain,  [ ] Joint pain  Hematologic:No history of hypercoagulable state.  No history of easy bleeding.  No history of anemia  Gastrointestinal: No hematochezia or melena,  No gastroesophageal reflux, no trouble swallowing  Urinary: [ ] chronic Kidney disease, [ ] on HD - [ ] MWF or [ ] TTHS, [ ] Burning with urination, [ ] Frequent urination, [ ] Difficulty urinating;   Skin: No rashes  Psychological: No history of anxiety,  No history of depression   Physical Examination  Vitals:   08/13/20 0117 08/13/20 0544 08/13/20 0545 08/13/20 0825  BP: (!) 153/84 (!) 164/84 (!) 164/84 (!) 168/94  Pulse: 97 89  97  Resp:  14  16  Temp:    (!) 97.4 F (36.3 C)  TempSrc:    Oral  SpO2: 93% 92%  94%  Weight:  44.7 kg    Height:        Body mass index is 18.02 kg/m.  General:  Alert and oriented, no acute distress HEENT:  Normal Neck: No JVD Cardiac: Regular Rate and Rhythm Skin: No rash, left first toe macerated with ulceration   mild erythema no fluctuance or significant drainage Extremity Pulses:  2+ radial, brachial, femoral, 1+ right popliteal absent left popliteal absent dorsalis pedis, posterior tibial pulses bilaterally Musculoskeletal: No deformity or edema  Neurologic: Upper and lower extremity motor 5/5 and symmetric  DATA:  CBC    Component Value Date/Time   WBC 10.5 08/13/2020 0219   RBC 3.14 (L) 08/13/2020 0219   HGB 9.3 (L) 08/13/2020 0219   HCT 29.0 (L) 08/13/2020 0219   PLT 170 08/13/2020 0219   MCV 92.4 08/13/2020 0219   MCH 29.6 08/13/2020 0219   MCHC 32.1 08/13/2020 0219   RDW 15.9 (H) 08/13/2020 0219   LYMPHSABS 1.8 04/19/2020 0255   MONOABS 0.6 04/19/2020 0255   EOSABS 0.0 04/19/2020 0255   BASOSABS 0.0 04/19/2020 0255    BMET    Component Value Date/Time   NA 143 08/13/2020 0219   K 3.4 (L) 08/13/2020 0219   CL 108 08/13/2020 0219   CO2 25 08/13/2020 0219   GLUCOSE 115 (H) 08/13/2020 0219   BUN 19 08/13/2020 0219   CREATININE 0.61 08/13/2020 0219   CALCIUM 9.4 08/13/2020 0219   GFRNONAA >60 08/13/2020 0219   GFRAA >60 12/20/2017 0915   Patient had bilateral ABIs performed at [facility] on August 12, 2020.  Right side was 0.65, left side was 0.51  ASSESSMENT: Early gangrenous changes left first toe, peripheral arterial disease bilaterally left leg symptomatic   PLAN: Abdominal aortogram with bilateral lower extremity runoff possible intervention scheduled for Friday, August 16, 2020.  After revascularization she will need amputation of the left first toe.  At the patient is medically stable prior to 18 March she can be discharged and brought back on Friday.  Fabienne Bruns, MD [facility] [place] Office: [phone]

## 2020-08-13 NOTE — Progress Notes (Signed)
Pt was awake and oriented when [facility] arrived to transport pt to [facility]. Time out 0017.   Telephone report called to Gillermina Phy, RN [phone].

## 2020-08-13 NOTE — Consult Note (Signed)
Pt seen and examined.  Full consult note to follow.  Briefly, gangrene left first toe for about 2 weeks.  Has ABI 0.5.  Will plan for aortogram with bilat runoff possible intervention on Friday.  If pt other medical issues resolved prior to this she can go home on oral antibiotics and return as outpt.  Fabienne Bruns, MD [facility] [place] Office: [phone]

## 2020-08-13 NOTE — Progress Notes (Signed)
ANTICOAGULATION CONSULT NOTE  Pharmacy Consult for heparin  Indication: atrial fibrillation  No Known Allergies  Patient Measurements: Height: 5\' 2"  (157.5 cm) Weight: 44.7 kg (98 lb 8 oz) IBW/kg (Calculated) : 50.1 Heparin Dosing Weight: HEPARIN DW (KG): 44.7   Vital Signs: Temp: 97.5 F (36.4 C) (03/15 0107) Temp Source: Oral (03/15 0107) BP: 153/84 (03/15 0117) Pulse Rate: 97 (03/15 0117)  Labs: Recent Labs    08/10/20 2207 08/11/20 0552 08/11/20 1446 08/12/20 0802 08/12/20 1527 08/13/20 0219  HGB 10.0*  --   --  9.3*  --  9.3*  HCT 30.5*  --   --  29.5*  --  29.0*  PLT 183  --   --  176  --  170  HEPARINUNFRC  --   --    < > 0.38 0.32 0.23*  CREATININE 0.70 0.51  --  0.47  --  0.61   < > = values in this interval not displayed.    Estimated Creatinine Clearance: 50.1 mL/min (by C-G formula based on SCr of 0.61 mg/dL).   Assessment: 65 y.o. female with Afib for heparin  Goal of Therapy:  Heparin level 0.3-0.7 units/ml Monitor platelets by anticoagulation protocol: Yes   Plan:  Increase Heparin 1200 units/hr  77 08/13/2020,4:34 AM

## 2020-08-13 NOTE — Progress Notes (Signed)
Patient ID: Sarah Bonilla, female   DOB: Feb 10, 1956, 65 y.o.   MRN: [id number]  PROGRESS NOTE    Sarah Bonilla  WUJ:[id number] DOB: 10-02-1955 DOA: 08/10/2020 PCP: Patient, No Pcp Per   Brief Narrative:  65 y.o.femalewith medical history significant forpoorly controlled hypertension due to medication and dietary noncompliance, tobacco abuse with COPD, chronic diastolic congestive heart failure, and chronic left lower extremity DVT who presented to the ED with complaints of some leg swelling over the last 1 month which started after she stubbedher left great toe.  Patient was admitted with hypertensive crisis and new onset atrial flutter.  Cardiology was consulted. She was also noted to have left great toe gangrene with ABI demonstrating moderate PAD.  General surgery recommended transfer to [facility] for vascular surgery evaluation.  Patient was transferred from [facility] to [facility].  Vascular surgery was consulted.  She has been started on vancomycin.  Assessment & Plan:   Left great toe pain likely secondary to cellulitis/dry gangrene in the setting of moderate PAD -Currently on vancomycin.  We will add Rocephin and Flagyl as well.  Vascular surgery evaluation appreciated and is planning to do abdominal aortogram with bilateral lower extremity runoff with possible intervention on Friday.  Paroxysmal atrial flutter -New onset.  Currently rate controlled.  Cardiology following.  Continue Coreg.  Continue heparin drip  Hypertensive crisis: Resolved -Blood pressure still on the higher side.  Cardene drip has been weaned off.  Continue amlodipine, Coreg, hydralazine, lisinopril and spironolactone.  Monitor blood pressure  Hypokalemia -Replace.  Repeat a.m. labs  Leukocytosis -Resolved  Anemia of chronic disease -Questionable cause.  Hemoglobin stable  Chronic left lower extremity DVT -Currently on heparin drip.  Patient is to be more compliant with oral  anticoagulation as an outpatient  COPD Tobacco abuse ongoing -No signs of exacerbation.  Patient was counseled regarding tobacco cessation by prior hospitalist. -As needed bronchodilators   DVT prophylaxis: Heparin drip Code Status: Full Family Communication: None at bedside Disposition Plan: Status is: Inpatient  Remains inpatient appropriate because:Inpatient level of care appropriate due to severity of illness   Dispo: The patient is from: Home              Anticipated d/c is to: Home              Patient currently is not medically stable to d/c.   Difficult to place patient No   Consultants: Cardiology/general surgery/vascular surgery  Procedures: Echo  Antimicrobials:  Anti-infectives (From admission, onward)   Start     Dose/Rate Route Frequency Ordered Stop   08/11/20 2200  vancomycin (VANCOREADY) IVPB 750 mg/150 mL        750 mg 150 mL/hr over 60 Minutes Intravenous Every 24 hours 08/11/20 0019     08/11/20 0015  vancomycin (VANCOCIN) IVPB 1000 mg/200 mL premix        1,000 mg 200 mL/hr over 60 Minutes Intravenous  Once 08/11/20 0012 08/11/20 0306   08/10/20 2345  cefTRIAXone (ROCEPHIN) 1 g in sodium chloride 0.9 % 100 mL IVPB        1 g 200 mL/hr over 30 Minutes Intravenous  Once 08/10/20 2340 08/11/20 0100       Subjective:   Objective: Vitals:   08/13/20 0117 08/13/20 0544 08/13/20 0545 08/13/20 0825  BP: (!) 153/84 (!) 164/84 (!) 164/84 (!) 168/94  Pulse: 97 89  97  Resp:  14  16  Temp:    (!)  97.4 F (36.3 C)  TempSrc:    Oral  SpO2: 93% 92%  94%  Weight:  44.7 kg    Height:        Intake/Output Summary (Last 24 hours) at 08/13/2020 1015 Last data filed at 08/13/2020 0800 Gross per 24 hour  Intake 291.19 ml  Output 1050 ml  Net -758.81 ml   Filed Weights   08/10/20 1945 08/13/20 0544  Weight: 44.7 kg 44.7 kg    Examination:  General exam: Appears calm and comfortable.  Looks very thinly built.  Currently on room air. Respiratory  system: Bilateral decreased breath sounds at bases Cardiovascular system: S1 & S2 heard, Rate controlled Gastrointestinal system: Abdomen is nondistended, soft and nontender. Normal bowel sounds heard. Extremities: No cyanosis, clubbing; trace lower extremity edema. Central nervous system: Alert and oriented. No focal neurological deficits. Moving extremities Skin: Left great toe maceration with ulceration with gangrenous changes with surrounding erythema Psychiatry: Flat affect   Data Reviewed: I have personally reviewed following labs and imaging studies  CBC: Recent Labs  Lab 08/10/20 2207 08/12/20 0802 08/13/20 0219  WBC 7.3 12.0* 10.5  HGB 10.0* 9.3* 9.3*  HCT 30.5* 29.5* 29.0*  MCV 91.9 93.7 92.4  PLT 183 176 170   Basic Metabolic Panel: Recent Labs  Lab 08/10/20 2207 08/10/20 2352 08/11/20 0552 08/12/20 0802 08/13/20 0219  NA 142  --  142 144 143  K 2.4*  --  2.5* 3.2* 3.4*  CL 106  --  101 109 108  CO2 25  --  29 23 25   GLUCOSE 105*  --  102* 110* 115*  BUN 23  --  18 18 19   CREATININE 0.70  --  0.51 0.47 0.61  CALCIUM 9.2  --  9.0 9.2 9.4  MG  --  1.7  --  1.7 1.7   GFR: Estimated Creatinine Clearance: 50.1 mL/min (by C-G formula based on SCr of 0.61 mg/dL). Liver Function Tests: No results for input(s): AST, ALT, ALKPHOS, BILITOT, PROT, ALBUMIN in the last 168 hours. No results for input(s): LIPASE, AMYLASE in the last 168 hours. No results for input(s): AMMONIA in the last 168 hours. Coagulation Profile: No results for input(s): INR, PROTIME in the last 168 hours. Cardiac Enzymes: No results for input(s): CKTOTAL, CKMB, CKMBINDEX, TROPONINI in the last 168 hours. BNP (last 3 results) No results for input(s): PROBNP in the last 8760 hours. HbA1C: Recent Labs    08/10/20 2208  HGBA1C 5.1   CBG: No results for input(s): GLUCAP in the last 168 hours. Lipid Profile: No results for input(s): CHOL, HDL, LDLCALC, TRIG, CHOLHDL, LDLDIRECT in the last 72  hours. Thyroid Function Tests: No results for input(s): TSH, T4TOTAL, FREET4, T3FREE, THYROIDAB in the last 72 hours. Anemia Panel: No results for input(s): VITAMINB12, FOLATE, FERRITIN, TIBC, IRON, RETICCTPCT in the last 72 hours. Sepsis Labs: Recent Labs  Lab 08/10/20 2208 08/10/20 2352  LATICACIDVEN 1.1 1.5    Recent Results (from the past 240 hour(s))  Blood culture (routine x 2)     Status: None (Preliminary result)   Collection Time: 08/10/20 11:53 PM   Specimen: BLOOD RIGHT FOREARM  Result Value Ref Range Status   Specimen Description BLOOD RIGHT FOREARM  Final   Special Requests   Final    BOTTLES DRAWN AEROBIC AND ANAEROBIC Blood Culture adequate volume   Culture   Final    NO GROWTH 2 DAYS Performed at [facility], [address], [place], [address] [place]  Report Status PENDING  Incomplete  Blood culture (routine x 2)     Status: None (Preliminary result)   Collection Time: 08/11/20 12:00 AM   Specimen: BLOOD RIGHT ARM  Result Value Ref Range Status   Specimen Description BLOOD RIGHT ARM  Final   Special Requests   Final    BOTTLES DRAWN AEROBIC AND ANAEROBIC Blood Culture results may not be optimal due to an excessive volume of blood received in culture bottles   Culture   Final    NO GROWTH 2 DAYS Performed at [facility], [address], [place], [place] [address]    Report Status PENDING  Incomplete  Resp Panel by RT-PCR (Flu A&B, Covid) Nasopharyngeal Swab     Status: None   Collection Time: 08/11/20  1:07 AM   Specimen: Nasopharyngeal Swab; Nasopharyngeal(NP) swabs in vial transport medium  Result Value Ref Range Status   SARS Coronavirus 2 by RT PCR NEGATIVE NEGATIVE Final    Comment: (NOTE) SARS-CoV-2 target nucleic acids are NOT DETECTED.  The SARS-CoV-2 RNA is generally detectable in upper respiratory specimens during the acute phase of infection. The lowest concentration of SARS-CoV-2 viral copies this assay can detect is 138 copies/mL. A  negative result does not preclude SARS-Cov-2 infection and should not be used as the sole basis for treatment or other patient management decisions. A negative result may occur with  improper specimen collection/handling, submission of specimen other than nasopharyngeal swab, presence of viral mutation(s) within the areas targeted by this assay, and inadequate number of viral copies(<138 copies/mL). A negative result must be combined with clinical observations, patient history, and epidemiological information. The expected result is Negative.  Fact Sheet for Patients:  BloggerCourse.com  Fact Sheet for Healthcare Providers:  SeriousBroker.it  This test is no t yet approved or cleared by the [place] FDA and  has been authorized for detection and/or diagnosis of SARS-CoV-2 by FDA under an Emergency Use Authorization (EUA). This EUA will remain  in effect (meaning this test can be used) for the duration of the COVID-19 declaration under Section 564(b)(1) of the Act, 21 U.S.C.section 360bbb-3(b)(1), unless the authorization is terminated  or revoked sooner.       Influenza A by PCR NEGATIVE NEGATIVE Final   Influenza B by PCR NEGATIVE NEGATIVE Final    Comment: (NOTE) The Xpert Xpress SARS-CoV-2/FLU/RSV plus assay is intended as an aid in the diagnosis of influenza from Nasopharyngeal swab specimens and should not be used as a sole basis for treatment. Nasal washings and aspirates are unacceptable for Xpert Xpress SARS-CoV-2/FLU/RSV testing.  Fact Sheet for Patients: BloggerCourse.com  Fact Sheet for Healthcare Providers: SeriousBroker.it  This test is not yet approved or cleared by the [place] FDA and has been authorized for detection and/or diagnosis of SARS-CoV-2 by FDA under an Emergency Use Authorization (EUA). This EUA will remain in effect (meaning this test can  be used) for the duration of the COVID-19 declaration under Section 564(b)(1) of the Act, 21 U.S.C. section 360bbb-3(b)(1), unless the authorization is terminated or revoked.  Performed at [facility], [address], [place], [place] [address]   MRSA PCR Screening     Status: None   Collection Time: 08/11/20  9:12 AM   Specimen: Nasal Mucosa; Nasopharyngeal  Result Value Ref Range Status   MRSA by PCR NEGATIVE NEGATIVE Final    Comment:        The GeneXpert MRSA Assay (FDA approved for NASAL specimens only), is one component of  a comprehensive MRSA colonization surveillance program. It is not intended to diagnose MRSA infection nor to guide or monitor treatment for MRSA infections. Performed at [facility], [address], [place], [place] [address]          Radiology Studies: US ARTERIAL ABI (SCREENING LOWER EXTREMITY)  Result Date: 08/12/2020 CLINICAL DATA:  Left first toe gangrene.  History of hypertension. EXAM: NONINVASIVE PHYSIOLOGIC VASCULAR STUDY OF BILATERAL LOWER EXTREMITIES TECHNIQUE: Evaluation of both lower extremities were performed at rest, including calculation of ankle-brachial indices with single level Doppler, pressure and pulse volume recording. COMPARISON:  None. FINDINGS: Right ABI:  0.65 Left ABI:  0.51 Right Lower Extremity: Monophasic posterior tibial and dorsalis pedis waveforms. Left Lower Extremity: Monophasic posterior tibial and dorsalis pedis waveforms. 0.5-0.79 Moderate PAD IMPRESSION: Evidence of moderate peripheral vascular disease in both lower extremities with resting ankle-brachial indices of 0.65 on the right and 0.51 on the left. Distal waveforms are monophasic bilaterally. Consider further evaluation with CT angiography of the abdominal aorta with bilateral iliofemoral runoff for further characterization of arterial occlusive disease. Electronically Signed   By: Irish Lack M.D.   On: 08/12/2020 11:05   ECHOCARDIOGRAM COMPLETE  Result  Date: 08/12/2020    ECHOCARDIOGRAM REPORT   Patient Name:   Sarah Bonilla Date of Exam: 08/12/2020 Medical Rec #:  [id number]        Height:       62.0 in Accession #:    [id number]       Weight:       98.5 lb Date of Birth:  July 11, 1955        BSA:          1.415 m Patient Age:    65 years         BP:           171/85 mmHg Patient Gender: F                HR:           88 bpm. [facility] Procedure: 2D Echo, Cardiac Doppler and Color Doppler Indications:    Abnormal ECG R94.31  History:        Patient has prior history of Echocardiogram examinations, most                 recent 04/19/2020. CHF, Previous Myocardial Infarction, COPD;                 Risk Factors:Hypertension. Tobacco abuse.  Sonographer:    Celesta Gentile RCS Referring Phys: [id number] PRATIK D [facility] IMPRESSIONS  1. Hypokinesis of the distal anteroseptal distal anterolateral wall; akinesis of the apex. . Left ventricular ejection fraction, by estimation, is 55 to 60%. The left ventricle has normal function. Left ventricular diastolic parameters are consistent with Grade II diastolic dysfunction (pseudonormalization).  2. Right ventricular systolic function is normal. The right ventricular size is normal. Mildly increased right ventricular wall thickness. There is moderately elevated pulmonary artery systolic pressure.  3. Left atrial size was severely dilated.  4. MR is predominantly cnetral, though some is directed posterior into LA. . Moderate to severe mitral valve regurgitation.  5. Tricuspid valve regurgitation is moderate.  6. The aortic valve is tricuspid. Aortic valve regurgitation is mild. Mild aortic valve sclerosis is present, with no evidence of aortic valve stenosis.  7. The inferior vena cava is dilated in size with <50% respiratory variability, suggesting right atrial pressure of 15 mmHg. FINDINGS  Left Ventricle: Hypokinesis of the distal anteroseptal distal anterolateral wall; akinesis of the apex. Left ventricular ejection  fraction, by estimation, is 55 to 60%. The left ventricle has normal function. The left ventricular internal cavity size was normal in size. There is no left ventricular hypertrophy. Left ventricular diastolic parameters are consistent with Grade II diastolic dysfunction (pseudonormalization). Right Ventricle: The right ventricular size is normal. Mildly increased right ventricular wall thickness. Right ventricular systolic function is normal. There is moderately elevated pulmonary artery systolic pressure. The tricuspid regurgitant velocity is 3.27 m/s, and with an assumed right atrial pressure of 8 mmHg, the estimated right ventricular systolic pressure is 50.8 mmHg. Left Atrium: Left atrial size was severely dilated. Right Atrium: Right atrial size was normal in size. Pericardium: There is no evidence of pericardial effusion. Mitral Valve: MR is predominantly cnetral, though some is directed posterior into LA. There is mild thickening of the mitral valve leaflet(s). Moderate to severe mitral valve regurgitation. Tricuspid Valve: The tricuspid valve is normal in structure. Tricuspid valve regurgitation is moderate. Aortic Valve: The aortic valve is tricuspid. Aortic valve regurgitation is mild. Mild aortic valve sclerosis is present, with no evidence of aortic valve stenosis. Pulmonic Valve: The pulmonic valve was not well visualized. Aorta: The aortic root is normal in size and structure. Venous: The inferior vena cava is dilated in size with less than 50% respiratory variability, suggesting right atrial pressure of 15 mmHg.  LEFT VENTRICLE PLAX 2D LVIDd:         4.00 cm  Diastology LVIDs:         2.60 cm  LV e' medial:    5.98 cm/s LV PW:         1.10 cm  LV E/e' medial:  20.7 LV IVS:        1.10 cm  LV e' lateral:   8.49 cm/s LVOT diam:     1.90 cm  LV E/e' lateral: 14.6 LV SV:         63 LV SV Index:   44 LVOT Area:     2.84 cm  RIGHT VENTRICLE RV S prime:     11.70 cm/s TAPSE (M-mode): 2.0 cm LEFT ATRIUM              Index       RIGHT ATRIUM           Index LA diam:        3.10 cm 2.19 cm/m  RA Area:     15.70 cm LA Vol (A2C):   61.4 ml 43.40 ml/m RA Volume:   40.00 ml  28.27 ml/m LA Vol (A4C):   66.5 ml 47.00 ml/m LA Biplane Vol: 64.1 ml 45.31 ml/m  AORTIC VALVE LVOT Vmax:   117.00 cm/s LVOT Vmean:  78.300 cm/s LVOT VTI:    0.222 m  AORTA Ao Root diam: 2.70 cm MITRAL VALVE                 TRICUSPID VALVE MV Area (PHT): 3.46 cm      TR Peak grad:   42.8 mmHg MV Decel Time: 219 msec      TR Vmax:        327.00 cm/s MR Peak grad:    136.4 mmHg MR Mean grad:    86.0 mmHg   SHUNTS MR Vmax:         584.00 cm/s Systemic VTI:  0.22 m MR Vmean:        437.0 cm/s  Systemic Diam: 1.90 cm MR PISA:         2.26 cm MR PISA Eff ROA: 9 mm MR PISA Radius:  0.60 cm MV E velocity: 124.00 cm/s MV A velocity: 81.00 cm/s MV E/A ratio:  1.53 Dietrich Pates MD Electronically signed by Dietrich Pates MD Signature Date/Time: 08/12/2020/7:26:41 PM    Final         Scheduled Meds: . amLODipine  10 mg Oral Daily  . carvedilol  25 mg Oral BID WC  . Chlorhexidine Gluconate Cloth  6 each Topical Daily  . hydrALAZINE  100 mg Oral Q8H  . lisinopril  40 mg Oral Daily  . pantoprazole  40 mg Oral Daily  . potassium chloride  40 mEq Oral Once  . sodium chloride flush  3 mL Intravenous Q12H  . spironolactone  25 mg Oral Daily   Continuous Infusions: . sodium chloride Stopped (08/11/20 2249)  . heparin 1,200 Units/hr (08/13/20 0439)  . vancomycin 750 mg (08/12/20 2141)          Glade Lloyd, MD [facility] 08/13/2020, 10:15 AM

## 2020-08-13 NOTE — H&P (View-Only) (Signed)
Referring Physician: [facility] hospitalist  Patient name: Sarah Bonilla MRN: [id number] DOB: February 20, 1956 Sex: female  REASON FOR CONSULT: Gangrene left first toe  HPI: Sarah Bonilla is a 65 y.o. female transferred from [facility] where she was admitted with hypertensive urgency.  Patient states her left first toe had been turning dark over the last several weeks.  She does not really complain of claudication symptoms in either leg.  She has never had any nonhealing wounds.  She does smoke about a pack of cigarettes per day.  She does not know she has a history of diabetes.  She does not think she has ever been tested.  She thinks she may have had some intermittent fever or chills.  She has had no significant drainage from the toe.   Past Medical History:  Diagnosis Date  . Hypertension    Past Surgical History:  Procedure Laterality Date  . CHOLECYSTECTOMY      Family History  Problem Relation Age of Onset  . CVA Mother   . Hypertension Father     SOCIAL HISTORY: Social History   Socioeconomic History  . Marital status: Widowed    Spouse name: Not on file  . Number of children: Not on file  . Years of education: Not on file  . Highest education level: Not on file  Occupational History  . Not on file  Tobacco Use  . Smoking status: Current Every Day Smoker    Packs/day: 1.00    Types: Cigarettes  . Smokeless tobacco: Never Used  Vaping Use  . Vaping Use: Never used  Substance and Sexual Activity  . Alcohol use: Yes    Comment: occasionally   . Drug use: Never  . Sexual activity: Not on file  Other Topics Concern  . Not on file  Social History Narrative  . Not on file   Social Determinants of Health   Financial Resource Strain: Not on file  Food Insecurity: Not on file  Transportation Needs: Not on file  Physical Activity: Not on file  Stress: Not on file  Social Connections: Not on file  Intimate Partner Violence: Not on file    No Known  Allergies  Current Facility-Administered Medications  Medication Dose Route Frequency Provider Last Rate Last Admin  . 0.9 %  sodium chloride infusion  250 mL Intravenous PRN Sherryll Burger, Pratik D, DO   Stopped at 08/11/20 2249  . acetaminophen (TYLENOL) tablet 650 mg  650 mg Oral Q6H PRN Maurilio Lovely D, DO   650 mg at 08/12/20 1740   Or  . acetaminophen (TYLENOL) suppository 650 mg  650 mg Rectal Q6H PRN Sherryll Burger, Pratik D, DO      . amLODipine (NORVASC) tablet 10 mg  10 mg Oral Daily Sherryll Burger, Pratik D, DO   10 mg at 08/12/20 0947  . carvedilol (COREG) tablet 25 mg  25 mg Oral BID WC Shah, Pratik D, DO   25 mg at 08/12/20 1634  . Chlorhexidine Gluconate Cloth 2 % PADS 6 each  6 each Topical Daily Adefeso, Oladapo, DO   6 each at 08/12/20 0948  . heparin ADULT infusion 100 units/mL (25000 units/250mL)  1,200 Units/hr Intravenous Continuous Maurilio Lovely D, DO 12 mL/hr at 08/13/20 0439 1,200 Units/hr at 08/13/20 0439  . hydrALAZINE (APRESOLINE) tablet 100 mg  100 mg Oral Q8H Pricilla Riffle, MD   100 mg at 08/13/20 0545  . labetalol (NORMODYNE) injection 10 mg  10 mg Intravenous Q6H  PRN Maurilio Lovely D, DO   10 mg at 08/12/20 0701  . levalbuterol (XOPENEX) nebulizer solution 0.63 mg  0.63 mg Nebulization Q6H PRN Sherryll Burger, Pratik D, DO      . lisinopril (ZESTRIL) tablet 40 mg  40 mg Oral Daily Sherryll Burger, Pratik D, DO   40 mg at 08/12/20 0948  . morphine 2 MG/ML injection 2 mg  2 mg Intravenous Q4H PRN Lucretia Roers, MD   2 mg at 08/13/20 0131  . ondansetron (ZOFRAN) tablet 4 mg  4 mg Oral Q6H PRN Sherryll Burger, Pratik D, DO       Or  . ondansetron (ZOFRAN) injection 4 mg  4 mg Intravenous Q6H PRN Sherryll Burger, Pratik D, DO      . pantoprazole (PROTONIX) EC tablet 40 mg  40 mg Oral Daily Sherryll Burger, Pratik D, DO   40 mg at 08/12/20 0947  . potassium chloride SA (KLOR-CON) CR tablet 40 mEq  40 mEq Oral Once Alekh, Kshitiz, MD      . sodium chloride flush (NS) 0.9 % injection 3 mL  3 mL Intravenous Q12H Shah, Pratik D, DO   3 mL at 08/12/20  2151  . sodium chloride flush (NS) 0.9 % injection 3 mL  3 mL Intravenous PRN Sherryll Burger, Pratik D, DO      . spironolactone (ALDACTONE) tablet 25 mg  25 mg Oral Daily Pricilla Riffle, MD   25 mg at 08/12/20 1125  . vancomycin (VANCOREADY) IVPB 750 mg/150 mL  750 mg Intravenous Q24H Maurilio Lovely D, DO 150 mL/hr at 08/12/20 2141 750 mg at 08/12/20 2141    ROS:   General:  No weight loss, Fever, chills  HEENT: No recent headaches, no nasal bleeding, no visual changes, no sore throat  Neurologic: No dizziness, blackouts, seizures. No recent symptoms of stroke or mini- stroke. No recent episodes of slurred speech, or temporary blindness.  Cardiac: No recent episodes of chest pain/pressure, no shortness of breath at rest.  No shortness of breath with exertion.  Denies history of atrial fibrillation or irregular heartbeat  Vascular: No history of rest pain in feet.  No history of claudication.  No history of non-healing ulcer, No history of DVT   Pulmonary: No home oxygen, no productive cough, no hemoptysis,  No asthma or wheezing  Musculoskeletal:  [ ]  Arthritis, [ ]  Low back pain,  [ ]  Joint pain  Hematologic:No history of hypercoagulable state.  No history of easy bleeding.  No history of anemia  Gastrointestinal: No hematochezia or melena,  No gastroesophageal reflux, no trouble swallowing  Urinary: [ ]  chronic Kidney disease, [ ]  on HD - [ ]  MWF or [ ]  TTHS, [ ]  Burning with urination, [ ]  Frequent urination, [ ]  Difficulty urinating;   Skin: No rashes  Psychological: No history of anxiety,  No history of depression   Physical Examination  Vitals:   08/13/20 0117 08/13/20 0544 08/13/20 0545 08/13/20 0825  BP: (!) 153/84 (!) 164/84 (!) 164/84 (!) 168/94  Pulse: 97 89  97  Resp:  14  16  Temp:    (!) 97.4 F (36.3 C)  TempSrc:    Oral  SpO2: 93% 92%  94%  Weight:  44.7 kg    Height:        Body mass index is 18.02 kg/m.  General:  Alert and oriented, no acute distress HEENT:  Normal Neck: No JVD Cardiac: Regular Rate and Rhythm Skin: No rash, left first toe macerated with ulceration  mild erythema no fluctuance or significant drainage Extremity Pulses:  2+ radial, brachial, femoral, 1+ right popliteal absent left popliteal absent dorsalis pedis, posterior tibial pulses bilaterally Musculoskeletal: No deformity or edema  Neurologic: Upper and lower extremity motor 5/5 and symmetric  DATA:  CBC    Component Value Date/Time   WBC 10.5 08/13/2020 0219   RBC 3.14 (L) 08/13/2020 0219   HGB 9.3 (L) 08/13/2020 0219   HCT 29.0 (L) 08/13/2020 0219   PLT 170 08/13/2020 0219   MCV 92.4 08/13/2020 0219   MCH 29.6 08/13/2020 0219   MCHC 32.1 08/13/2020 0219   RDW 15.9 (H) 08/13/2020 0219   LYMPHSABS 1.8 04/19/2020 0255   MONOABS 0.6 04/19/2020 0255   EOSABS 0.0 04/19/2020 0255   BASOSABS 0.0 04/19/2020 0255    BMET    Component Value Date/Time   NA 143 08/13/2020 0219   K 3.4 (L) 08/13/2020 0219   CL 108 08/13/2020 0219   CO2 25 08/13/2020 0219   GLUCOSE 115 (H) 08/13/2020 0219   BUN 19 08/13/2020 0219   CREATININE 0.61 08/13/2020 0219   CALCIUM 9.4 08/13/2020 0219   GFRNONAA >60 08/13/2020 0219   GFRAA >60 12/20/2017 0915   Patient had bilateral ABIs performed at [facility] on August 12, 2020.  Right side was 0.65, left side was 0.51  ASSESSMENT: Early gangrenous changes left first toe, peripheral arterial disease bilaterally left leg symptomatic   PLAN: Abdominal aortogram with bilateral lower extremity runoff possible intervention scheduled for Friday, August 16, 2020.  After revascularization she will need amputation of the left first toe.  At the patient is medically stable prior to 18 March she can be discharged and brought back on Friday.  Fabienne Bruns, MD [facility] [place] Office: [phone]

## 2020-08-13 NOTE — Progress Notes (Signed)
Reviewed with patient plan for agram on Friday, risks benefits procedure details.  Also d/w pt plan to then amputate left first toe the following Tuesday and possibly a bypass if we are unable to stent her artery on Friday.  She is agreeable to this.  Fabienne Bruns, MD [facility] [place] Office: [phone]

## 2020-08-13 NOTE — Progress Notes (Signed)
Progress Note  Patient Name: Sarah Bonilla Date of Encounter: 08/13/2020  [facility] Cardiologist: Dina Rich, MD   Subjective   She is complaining of left toes pain.  Inpatient Medications    Scheduled Meds: . amLODipine  10 mg Oral Daily  . carvedilol  25 mg Oral BID WC  . Chlorhexidine Gluconate Cloth  6 each Topical Daily  . hydrALAZINE  100 mg Oral Q8H  . lisinopril  40 mg Oral Daily  . metroNIDAZOLE  500 mg Oral Q8H  . pantoprazole  40 mg Oral Daily  . sodium chloride flush  3 mL Intravenous Q12H  . spironolactone  25 mg Oral Daily   Continuous Infusions: . sodium chloride Stopped (08/11/20 2249)  . cefTRIAXone (ROCEPHIN)  IV    . heparin 1,200 Units/hr (08/13/20 0439)  . vancomycin 750 mg (08/12/20 2141)   PRN Meds: sodium chloride, acetaminophen **OR** acetaminophen, labetalol, levalbuterol, morphine injection, ondansetron **OR** ondansetron (ZOFRAN) IV, sodium chloride flush   Vital Signs    Vitals:   08/13/20 0544 08/13/20 0545 08/13/20 0825 08/13/20 1035  BP: (!) 164/84 (!) 164/84 (!) 168/94 (!) 183/93  Pulse: 89  97   Resp: 14  16   Temp:   (!) 97.4 F (36.3 C)   TempSrc:   Oral   SpO2: 92%  94%   Weight: 44.7 kg     Height:        Intake/Output Summary (Last 24 hours) at 08/13/2020 1036 Last data filed at 08/13/2020 0800 Gross per 24 hour  Intake 291.19 ml  Output 1050 ml  Net -758.81 ml   Last 3 Weights 08/13/2020 08/10/2020 04/23/2020  Weight (lbs) 98 lb 8.7 oz 98 lb 8 oz 88 lb 2.9 oz  Weight (kg) 44.7 kg 44.679 kg 40 kg      Telemetry    SR, short runs of atrial tachycardia - Personally Reviewed  ECG    No new tracing - Personally Reviewed  Physical Exam   GEN: No acute distress.   Neck: No JVD Cardiac: RRR, no murmurs, rubs, or gallops.  Respiratory: Clear to auscultation bilaterally. GI: Soft, nontender, non-distended  MS: No edema; cold, left toe gangrene. Neuro:  Nonfocal  Psych: Normal affect   Labs     High Sensitivity Troponin:  No results for input(s): TROPONINIHS in the last 720 hours.    Chemistry Recent Labs  Lab 08/11/20 0552 08/12/20 0802 08/13/20 0219  NA 142 144 143  K 2.5* 3.2* 3.4*  CL 101 109 108  CO2 29 23 25   GLUCOSE 102* 110* 115*  BUN 18 18 19   CREATININE 0.51 0.47 0.61  CALCIUM 9.0 9.2 9.4  GFRNONAA >60 >60 >60  ANIONGAP 12 12 10      Hematology Recent Labs  Lab 08/10/20 2207 08/12/20 0802 08/13/20 0219  WBC 7.3 12.0* 10.5  RBC 3.32* 3.15* 3.14*  HGB 10.0* 9.3* 9.3*  HCT 30.5* 29.5* 29.0*  MCV 91.9 93.7 92.4  MCH 30.1 29.5 29.6  MCHC 32.8 31.5 32.1  RDW 15.5 15.9* 15.9*  PLT 183 176 170    BNP Recent Labs  Lab 08/10/20 2352  BNP 2,259.0*     DDimer  Recent Labs  Lab 08/10/20 2352  DDIMER 0.70*     Radiology    08/15/20 ARTERIAL ABI (SCREENING LOWER EXTREMITY)  Result Date: 08/12/2020 CLINICAL DATA:  Left first toe gangrene.  History of hypertension.  Evidence of moderate peripheral vascular disease in both lower extremities with resting ankle-brachial  indices of 0.65 on the right and 0.51 on the left. Distal waveforms are monophasic bilaterally. Consider further evaluation with CT angiography of the abdominal aorta with bilateral iliofemoral runoff for further characterization of arterial occlusive disease.  ECHOCARDIOGRAM COMPLETE Result Date: 08/12/2020  1. Hypokinesis of the distal anteroseptal distal anterolateral wall;  akinesis of the apex. . Left ventricular ejection fraction, by estimation,  is 55 to 60%. The left ventricle has normal function. Left ventricular  diastolic parameters are consistent  with Grade II diastolic dysfunction (pseudonormalization).  2. Right ventricular systolic function is normal. The right ventricular  size is normal. Mildly increased right ventricular wall thickness. There  is moderately elevated pulmonary artery systolic pressure.  3. Left atrial size was severely dilated.  4. MR is  predominantly cnetral, though some is directed posterior into  LA. . Moderate to severe mitral valve regurgitation.  5. Tricuspid valve regurgitation is moderate.  6. The aortic valve is tricuspid. Aortic valve regurgitation is mild.  Mild aortic valve sclerosis is present, with no evidence of aortic valve  stenosis.  7. The inferior vena cava is dilated in size with <50% respiratory  variability, suggesting right atrial pressure of 15 mmHg.    Patient Profile     65 y.o. female   Assessment & Plan    1.   Atrial flutter.   She is back in SR, on iv Heparin  2. Hypertension - add imdur 30 mg po daily - check renal arterial US  3   SOB/Edema  Pt's JVP is elevated   With that and mild rales and trib edema  I would give IV lasix x 1   - LVEF 55-60%, appears euvolemic   4. Hx LE gangrene, seen by Dr Darrick Penna from vascular surgery: - gangrene left first toe for about 2 weeks.  Has ABI 0.5.  Will plan for aortogram with bilat runoff possible intervention on Friday.  If pt other medical issues resolved prior to this she can go home on oral antibiotics and return as outpt. - On heparin  For questions or updates, please contact [facility] Please consult [email or web address] for contact info under     Signed, Tobias Alexander, MD  08/13/2020, 10:36 AM

## 2020-08-13 NOTE — Progress Notes (Signed)
ANTICOAGULATION CONSULT NOTE  Pharmacy Consult for heparin  Indication: atrial fibrillation  No Known Allergies  Patient Measurements: Height: 5\' 2"  (157.5 cm) Weight: 44.7 kg (98 lb 8.7 oz) IBW/kg (Calculated) : 50.1 Heparin Dosing Weight: HEPARIN DW (KG): 44.7   Vital Signs: Temp: 97.2 F (36.2 C) (03/15 1213) Temp Source: Oral (03/15 1213) BP: 154/85 (03/15 1322) Pulse Rate: 96 (03/15 1213)  Labs: Recent Labs    08/10/20 2207 08/11/20 0552 08/11/20 1446 08/12/20 0802 08/12/20 1527 08/13/20 0219 08/13/20 1100  HGB 10.0*  --   --  9.3*  --  9.3*  --   HCT 30.5*  --   --  29.5*  --  29.0*  --   PLT 183  --   --  176  --  170  --   HEPARINUNFRC  --   --    < > 0.38 0.32 0.23* 0.32  CREATININE 0.70 0.51  --  0.47  --  0.61  --    < > = values in this interval not displayed.    Estimated Creatinine Clearance: 50.1 mL/min (by C-G formula based on SCr of 0.61 mg/dL).   Assessment: 65 y.o. female with Afib on heparin. She is noted with PAD and plans for angiogram on Friday.  -heparin level on the low end of goal -hg= 9.3   Goal of Therapy:  Heparin level 0.3-0.7 units/ml Monitor platelets by anticoagulation protocol: Yes   Plan:  -Increase heparin to 1250 units/hr -Heparin daily wth CBC daily  Tuesday, PharmD Clinical Pharmacist **Pharmacist phone directory can now be found on [email or web address] ([email or web address]).  Listed under [facility].

## 2020-08-14 ENCOUNTER — Other Ambulatory Visit: Payer: Self-pay

## 2020-08-14 DIAGNOSIS — I10 Essential (primary) hypertension: Secondary | ICD-10-CM

## 2020-08-14 DIAGNOSIS — I4892 Unspecified atrial flutter: Secondary | ICD-10-CM | POA: Diagnosis present

## 2020-08-14 LAB — BASIC METABOLIC PANEL
Anion gap: 9 (ref 5–15)
BUN: 17 mg/dL (ref 8–23)
CO2: 23 mmol/L (ref 22–32)
Calcium: 9.2 mg/dL (ref 8.9–10.3)
Chloride: 109 mmol/L (ref 98–111)
Creatinine, Ser: 0.76 mg/dL (ref 0.44–1.00)
GFR, Estimated: >60 mL/min (ref >60)
Glucose, Bld: 103 mg/dL — ABNORMAL HIGH (ref 70–99)
Potassium: 3.9 mmol/L (ref 3.5–5.1)
Sodium: 141 mmol/L (ref 135–145)

## 2020-08-14 LAB — CBC
HCT: 27.2 % — ABNORMAL LOW (ref 36.0–46.0)
Hemoglobin: 8.7 g/dL — ABNORMAL LOW (ref 12.0–15.0)
MCH: 29.4 pg (ref 26.0–34.0)
MCHC: 32 g/dL (ref 30.0–36.0)
MCV: 91.9 fL (ref 80.0–100.0)
Platelets: 169 10*3/uL (ref 150–400)
RBC: 2.96 MIL/uL — ABNORMAL LOW (ref 3.87–5.11)
RDW: 16.3 % — ABNORMAL HIGH (ref 11.5–15.5)
WBC: 11.3 10*3/uL — ABNORMAL HIGH (ref 4.0–10.5)
nRBC: 0 % (ref 0.0–0.2)

## 2020-08-14 LAB — C-REACTIVE PROTEIN: CRP: 1.9 mg/dL — ABNORMAL HIGH (ref <1.0)

## 2020-08-14 LAB — MAGNESIUM: Magnesium: 2.1 mg/dL (ref 1.7–2.4)

## 2020-08-14 LAB — HEPARIN LEVEL (UNFRACTIONATED): Heparin Unfractionated: 0.29 IU/mL — ABNORMAL LOW (ref 0.30–0.70)

## 2020-08-14 MED ORDER — MORPHINE SULFATE (PF) 4 MG/ML IV SOLN
4.0000 mg | Freq: Once | INTRAVENOUS | Status: AC
Start: 2020-08-14 — End: 2020-08-14
  Administered 2020-08-14: 4 mg via INTRAVENOUS
  Filled 2020-08-14: qty 1

## 2020-08-14 MED ORDER — DILTIAZEM HCL ER COATED BEADS 240 MG PO CP24
240.0000 mg | ORAL_CAPSULE | Freq: Every day | ORAL | Status: DC
  Administered 2020-08-14 – 2020-08-15 (×2): 240 mg via ORAL
  Filled 2020-08-14 (×2): qty 1

## 2020-08-14 NOTE — Progress Notes (Addendum)
Progress Note  Patient Name: Sarah Bonilla Date of Encounter: 08/14/2020  [facility] Cardiologist: Dina Rich, MD   Subjective   Some SOB during the night  Inpatient Medications    Scheduled Meds: . amLODipine  10 mg Oral Daily  . carvedilol  25 mg Oral BID WC  . hydrALAZINE  100 mg Oral Q8H  . isosorbide mononitrate  30 mg Oral Daily  . lisinopril  40 mg Oral Daily  . metroNIDAZOLE  500 mg Oral Q8H  . pantoprazole  40 mg Oral Daily  . sodium chloride flush  3 mL Intravenous Q12H  . spironolactone  25 mg Oral Daily   Continuous Infusions: . sodium chloride Stopped (08/11/20 2249)  . cefTRIAXone (ROCEPHIN)  IV Stopped (08/13/20 1351)  . heparin 1,250 Units/hr (08/14/20 0055)  . vancomycin 750 mg (08/13/20 2242)   PRN Meds: sodium chloride, acetaminophen **OR** acetaminophen, labetalol, levalbuterol, morphine injection, ondansetron **OR** ondansetron (ZOFRAN) IV, sodium chloride flush   Vital Signs    Vitals:   08/13/20 1715 08/13/20 2057 08/13/20 2316 08/14/20 0459  BP: (!) 147/76 140/80 (!) 150/72 (!) 183/95  Pulse: 92 84 82 92  Resp: 18  17 19   Temp: 97.8 F (36.6 C) 98.7 F (37.1 C) 98.3 F (36.8 C) (!) 97.2 F (36.2 C)  TempSrc: Axillary Oral Oral Oral  SpO2: 91% 92% 92% 93%  Weight:    43.1 kg  Height:        Intake/Output Summary (Last 24 hours) at 08/14/2020 0725 Last data filed at 08/14/2020 0501 Gross per 24 hour  Intake 599.32 ml  Output 800 ml  Net -200.68 ml   Last 3 Weights 08/14/2020 08/13/2020 08/10/2020  Weight (lbs) 95 lb 1.6 oz 98 lb 8.7 oz 98 lb 8 oz  Weight (kg) 43.137 kg 44.7 kg 44.679 kg      Telemetry    SR with freq PACs and short PR bursts of PSVT - Personally Reviewed  ECG    No new - Personally Reviewed  Physical Exam   GEN: No acute distress.   Neck: No JVD Cardiac: RRR, to irregular no murmurs, rubs, or gallops.  Respiratory: diminished  to auscultation bilaterally. GI: Soft, nontender, non-distended   MS: No edema; edema resolved No deformity. Neuro:  Nonfocal  Psych: Normal affect   Labs    High Sensitivity Troponin:  No results for input(s): TROPONINIHS in the last 720 hours.    Chemistry Recent Labs  Lab 08/12/20 0802 08/13/20 0219 08/14/20 0343  NA 144 143 141  K 3.2* 3.4* 3.9  CL 109 108 109  CO2 23 25 23   GLUCOSE 110* 115* 103*  BUN 18 19 17   CREATININE 0.47 0.61 0.76  CALCIUM 9.2 9.4 9.2  GFRNONAA >60 >60 >60  ANIONGAP 12 10 9      Hematology Recent Labs  Lab 08/12/20 0802 08/13/20 0219 08/14/20 0343  WBC 12.0* 10.5 11.3*  RBC 3.15* 3.14* 2.96*  HGB 9.3* 9.3* 8.7*  HCT 29.5* 29.0* 27.2*  MCV 93.7 92.4 91.9  MCH 29.5 29.6 29.4  MCHC 31.5 32.1 32.0  RDW 15.9* 15.9* 16.3*  PLT 176 170 169    BNP Recent Labs  Lab 08/10/20 2352  BNP 2,259.0*     DDimer  Recent Labs  Lab 08/10/20 2352  DDIMER 0.70*     Radiology    08/15/20 ARTERIAL ABI (SCREENING LOWER EXTREMITY)  Result Date: 08/12/2020 CLINICAL DATA:  Left first toe gangrene.  History of hypertension. EXAM: NONINVASIVE  PHYSIOLOGIC VASCULAR STUDY OF BILATERAL LOWER EXTREMITIES TECHNIQUE: Evaluation of both lower extremities were performed at rest, including calculation of ankle-brachial indices with single level Doppler, pressure and pulse volume recording. COMPARISON:  None. FINDINGS: Right ABI:  0.65 Left ABI:  0.51 Right Lower Extremity: Monophasic posterior tibial and dorsalis pedis waveforms. Left Lower Extremity: Monophasic posterior tibial and dorsalis pedis waveforms. 0.5-0.79 Moderate PAD IMPRESSION: Evidence of moderate peripheral vascular disease in both lower extremities with resting ankle-brachial indices of 0.65 on the right and 0.51 on the left. Distal waveforms are monophasic bilaterally. Consider further evaluation with CT angiography of the abdominal aorta with bilateral iliofemoral runoff for further characterization of arterial occlusive disease. Electronically Signed   By: Irish Lack M.D.   On: 08/12/2020 11:05   ECHOCARDIOGRAM COMPLETE  Result Date: 08/12/2020    ECHOCARDIOGRAM REPORT   Patient Name:   [name] Date of Exam: 08/12/2020 Medical Rec #:  [id number]        Height:       62.0 in Accession #:    [id number]       Weight:       98.5 lb Date of Birth:  1955-10-01        BSA:          1.415 m Patient Age:    65 years         BP:           171/85 mmHg Patient Gender: F                HR:           88 bpm. [facility] Procedure: 2D Echo, Cardiac Doppler and Color Doppler Indications:    Abnormal ECG R94.31  History:        Patient has prior history of Echocardiogram examinations, most                 recent 04/19/2020. CHF, Previous Myocardial Infarction, COPD;                 Risk Factors:Hypertension. Tobacco abuse.  Sonographer:    Celesta Gentile RCS Referring Phys: [id number] PRATIK D [facility] IMPRESSIONS  1. Hypokinesis of the distal anteroseptal distal anterolateral wall; akinesis of the apex. . Left ventricular ejection fraction, by estimation, is 55 to 60%. The left ventricle has normal function. Left ventricular diastolic parameters are consistent with Grade II diastolic dysfunction (pseudonormalization).  2. Right ventricular systolic function is normal. The right ventricular size is normal. Mildly increased right ventricular wall thickness. There is moderately elevated pulmonary artery systolic pressure.  3. Left atrial size was severely dilated.  4. MR is predominantly cnetral, though some is directed posterior into LA. . Moderate to severe mitral valve regurgitation.  5. Tricuspid valve regurgitation is moderate.  6. The aortic valve is tricuspid. Aortic valve regurgitation is mild. Mild aortic valve sclerosis is present, with no evidence of aortic valve stenosis.  7. The inferior vena cava is dilated in size with <50% respiratory variability, suggesting right atrial pressure of 15 mmHg. FINDINGS  Left Ventricle: Hypokinesis of the distal anteroseptal  distal anterolateral wall; akinesis of the apex. Left ventricular ejection fraction, by estimation, is 55 to 60%. The left ventricle has normal function. The left ventricular internal cavity size was normal in size. There is no left ventricular hypertrophy. Left ventricular diastolic parameters are consistent with Grade II diastolic dysfunction (pseudonormalization). Right Ventricle: The right ventricular size is normal. Mildly  increased right ventricular wall thickness. Right ventricular systolic function is normal. There is moderately elevated pulmonary artery systolic pressure. The tricuspid regurgitant velocity is 3.27 m/s, and with an assumed right atrial pressure of 8 mmHg, the estimated right ventricular systolic pressure is 50.8 mmHg. Left Atrium: Left atrial size was severely dilated. Right Atrium: Right atrial size was normal in size. Pericardium: There is no evidence of pericardial effusion. Mitral Valve: MR is predominantly cnetral, though some is directed posterior into LA. There is mild thickening of the mitral valve leaflet(s). Moderate to severe mitral valve regurgitation. Tricuspid Valve: The tricuspid valve is normal in structure. Tricuspid valve regurgitation is moderate. Aortic Valve: The aortic valve is tricuspid. Aortic valve regurgitation is mild. Mild aortic valve sclerosis is present, with no evidence of aortic valve stenosis. Pulmonic Valve: The pulmonic valve was not well visualized. Aorta: The aortic root is normal in size and structure. Venous: The inferior vena cava is dilated in size with less than 50% respiratory variability, suggesting right atrial pressure of 15 mmHg.  LEFT VENTRICLE PLAX 2D LVIDd:         4.00 cm  Diastology LVIDs:         2.60 cm  LV e' medial:    5.98 cm/s LV PW:         1.10 cm  LV E/e' medial:  20.7 LV IVS:        1.10 cm  LV e' lateral:   8.49 cm/s LVOT diam:     1.90 cm  LV E/e' lateral: 14.6 LV SV:         63 LV SV Index:   44 LVOT Area:     2.84 cm  RIGHT  VENTRICLE RV S prime:     11.70 cm/s TAPSE (M-mode): 2.0 cm LEFT ATRIUM             Index       RIGHT ATRIUM           Index LA diam:        3.10 cm 2.19 cm/m  RA Area:     15.70 cm LA Vol (A2C):   61.4 ml 43.40 ml/m RA Volume:   40.00 ml  28.27 ml/m LA Vol (A4C):   66.5 ml 47.00 ml/m LA Biplane Vol: 64.1 ml 45.31 ml/m  AORTIC VALVE LVOT Vmax:   117.00 cm/s LVOT Vmean:  78.300 cm/s LVOT VTI:    0.222 m  AORTA Ao Root diam: 2.70 cm MITRAL VALVE                 TRICUSPID VALVE MV Area (PHT): 3.46 cm      TR Peak grad:   42.8 mmHg MV Decel Time: 219 msec      TR Vmax:        327.00 cm/s MR Peak grad:    136.4 mmHg MR Mean grad:    86.0 mmHg   SHUNTS MR Vmax:         584.00 cm/s Systemic VTI:  0.22 m MR Vmean:        437.0 cm/s  Systemic Diam: 1.90 cm MR PISA:         2.26 cm MR PISA Eff ROA: 9 mm MR PISA Radius:  0.60 cm MV E velocity: 124.00 cm/s MV A velocity: 81.00 cm/s MV E/A ratio:  1.53 Dietrich PatesPaula Ross MD Electronically signed by Dietrich PatesPaula Ross MD Signature Date/Time: 08/12/2020/7:26:41 PM    Final     Cardiac Studies  US ARTERIAL ABI (SCREENING LOWER EXTREMITY)  Result Date: 08/12/2020 CLINICAL DATA:  Left first toe gangrene.  History of hypertension.  Evidence of moderate peripheral vascular disease in both lower extremities with resting ankle-brachial indices of 0.65 on the right and 0.51 on the left. Distal waveforms are monophasic bilaterally. Consider further evaluation with CT angiography of the abdominal aorta with bilateral iliofemoral runoff for further characterization of arterial occlusive disease.  ECHOCARDIOGRAM COMPLETE Result Date: 08/12/2020  1. Hypokinesis of the distal anteroseptal distal anterolateral wall;  akinesis of the apex. . Left ventricular ejection fraction, by estimation,  is 55 to 60%. The left ventricle has normal function. Left ventricular  diastolic parameters are consistent  with Grade II diastolic dysfunction (pseudonormalization).  2. Right  ventricular systolic function is normal. The right ventricular  size is normal. Mildly increased right ventricular wall thickness. There  is moderately elevated pulmonary artery systolic pressure.  3. Left atrial size was severely dilated.  4. MR is predominantly cnetral, though some is directed posterior into  LA. . Moderate to severe mitral valve regurgitation.  5. Tricuspid valve regurgitation is moderate.  6. The aortic valve is tricuspid. Aortic valve regurgitation is mild.  Mild aortic valve sclerosis is present, with no evidence of aortic valve  stenosis.  7. The inferior vena cava is dilated in size with <50% respiratory  variability, suggesting right atrial pressure of 15 mmHg.    Patient Profile     65 y.o. female with a hx of uncontrolled HTN and now admitted with HTN emergency and atrial flutter.  Assessment & Plan    1. Atrial flutter. She is back in SR, on iv Heparin  2. Hypertension elevated 183/95 - add imdur 30 mg po daily - check renal arterial US  Will arrange    3 SOB/Edema Pt's JVP is elevated With that and mild rales and trib edema I would give IV lasix x 1  - LVEF 55-60%, appears euvolemic -neg 936 wt down from 44.7 Kg to 43.1 Kg  4.Hx LE gangrene, seen by Dr Darrick Penna from vascular surgery: - gangrene left first toe for about 2 weeks. Has ABI 0.5. Will plan for aortogram with bilat runoff possible intervention on Friday. If pt other medical issues resolved prior to this she can go home on oral antibiotics and return as outpt. - On heparin  5.  CAD on CTA of chest 3 vessel mild to severe coronary artery calcification   For questions or updates, please contact [facility] Please consult [email or web address] for contact info under    Signed, Nada Boozer, NP  08/14/2020, 7:25 AM    The patient was seen, examined and discussed with Nada Boozer, NP and I agree with the above.   1. Atrial flutter. She is back in SR, with very  frequent ectopy, I will switch amlodipine 10 mg daily to Cardizem CD 240 mg daily as she is remains hypertensive as well.  We will check renal arterial ultrasound.  Further management per vascular surgery that is planning angiogram for Friday.  Continue IV heparin.  Tobias Alexander, MD 08/14/2020

## 2020-08-14 NOTE — Progress Notes (Signed)
[facility]                                                                              Patient Demographics  Marifer Hurd, is a 65 y.o. female, DOB - 22-May-1956, ONG:[id number]  Admit date - 08/10/2020   Admitting Physician Frankey Shown, DO  Outpatient Primary MD for the patient is Patient, No Pcp Per  Outpatient specialists:   LOS - 3  days   Medical records reviewed and are as summarized below:    No chief complaint on file.      Brief summary   Patient is a 65 year old female with history of poorly controlled hypertension due to medication and dietary noncompliance, tobacco abuse with COPD, chronic diastolic CHF and chronic left lower extremity DVT who presented to the [facility] ED with complaints of some leg swelling over the last 1 month which started after she stubbedher left great toe.Patient was admitted with hypertensive crisis and new onset atrial flutter.  Cardiology consulted.She was also noted to have left great toe gangrene with ABI demonstrating moderate PAD. General surgery recommended transfer to [facility] for vascular surgery evaluation. Vascular surgery was consulted.  She has been started on vancomycin   Assessment & Plan    Principal Problem: Left great toe pain secondary to cellulitis/dry gangrene in the setting of moderate PAD -Continue vancomycin, Rocephin, Flagyl -Vascular surgery consulted, plan for angiogram on Friday and left first toe amputation, possibly bypass  Active Problems:   Hypertensive emergency -Resolved. BP improving, Cardene drip weaned off -Continue amlodipine, Coreg, hydralazine, lisinopril, spironolactone  Paroxysmal,  Atrial flutter (HCC), new onset -Currently rate controlled, continue Coreg -Continue heparin drip  Hypokalemia Resolved  Chronic left lower extremity DVT -Currently on heparin drip while vascular interventions are planned -Need to be more compliant with oral  anticoagulation  COPD with nicotine abuse -Currently no wheezing, stable, continue as needed bronchodilators -Counseled on nicotine cessation  Normocytic anemia, possibly anemia of chronic disease -Unclear etiology, baseline hemoglobin ~8  Code Status: Full CODE STATUS DVT Prophylaxis:     Level of Care: Level of care: Telemetry Cardiac Family Communication: Discussed all imaging results, lab results, explained to the patient    Disposition Plan:     Status is: Inpatient  Remains inpatient appropriate because:Inpatient level of care appropriate due to severity of illness   Dispo: The patient is from: Home              Anticipated d/c is to: Home              Patient currently is not medically stable to d/c.  Pending vascular interventions for left toe gangrene   Difficult to place patient No      Time Spent in minutes   35 minutes  Procedures:  2D echo  Consultants:   Cardiology General surgery Vascular surgery  Antimicrobials:   Anti-infectives (From admission, onward)   Start     Dose/Rate Route Frequency Ordered Stop   08/13/20 1400  metroNIDAZOLE (FLAGYL) tablet 500 mg        500 mg Oral Every 8 hours 08/13/20 1026  08/13/20 1115  cefTRIAXone (ROCEPHIN) 1 g in sodium chloride 0.9 % 100 mL IVPB        1 g 200 mL/hr over 30 Minutes Intravenous Every 24 hours 08/13/20 1026     08/11/20 2200  vancomycin (VANCOREADY) IVPB 750 mg/150 mL        750 mg 150 mL/hr over 60 Minutes Intravenous Every 24 hours 08/11/20 0019     08/11/20 0015  vancomycin (VANCOCIN) IVPB 1000 mg/200 mL premix        1,000 mg 200 mL/hr over 60 Minutes Intravenous  Once 08/11/20 0012 08/11/20 0306   08/10/20 2345  cefTRIAXone (ROCEPHIN) 1 g in sodium chloride 0.9 % 100 mL IVPB        1 g 200 mL/hr over 30 Minutes Intravenous  Once 08/10/20 2340 08/11/20 0100         Medications  Scheduled Meds: . carvedilol  25 mg Oral BID WC  . diltiazem  240 mg Oral Daily  . hydrALAZINE   100 mg Oral Q8H  . isosorbide mononitrate  30 mg Oral Daily  . lisinopril  40 mg Oral Daily  . metroNIDAZOLE  500 mg Oral Q8H  . pantoprazole  40 mg Oral Daily  . sodium chloride flush  3 mL Intravenous Q12H  . spironolactone  25 mg Oral Daily   Continuous Infusions: . sodium chloride Stopped (08/11/20 2249)  . cefTRIAXone (ROCEPHIN)  IV 1 g (08/14/20 1142)  . heparin 1,250 Units/hr (08/14/20 0055)  . vancomycin 750 mg (08/13/20 2242)   PRN Meds:.sodium chloride, acetaminophen **OR** acetaminophen, labetalol, levalbuterol, morphine injection, ondansetron **OR** ondansetron (ZOFRAN) IV, sodium chloride flush      Subjective:   Gregery Naina Hamill was seen and examined today.  No acute complaints, no fevers or chills, BP is improving Patient denies dizziness, chest pain, shortness of breath, abdominal pain, N/V/D/C, new weakness, numbess, tingling. No acute events overnight.    Objective:   Vitals:   08/13/20 2316 08/14/20 0459 08/14/20 1016 08/14/20 1407  BP: (!) 150/72 (!) 183/95 116/74 140/76  Pulse: 82 92 89 93  Resp: 17 19 20 18   Temp: 98.3 F (36.8 C) (!) 97.2 F (36.2 C)  98.2 F (36.8 C)  TempSrc: Oral Oral  Oral  SpO2: 92% 93% 100% 97%  Weight:  43.1 kg    Height:        Intake/Output Summary (Last 24 hours) at 08/14/2020 1531 Last data filed at 08/14/2020 1028 Gross per 24 hour  Intake 1318.51 ml  Output 550 ml  Net 768.51 ml     Wt Readings from Last 3 Encounters:  08/14/20 43.1 kg  04/23/20 40 kg  12/20/17 48.5 kg     Exam  General: Alert and oriented x 3, NAD, pleasant  Cardiovascular: S1 S2 auscultated, no murmurs, RRR  Respiratory: Decreased breath sound at the bases  Gastrointestinal: Soft, nontender, nondistended, + bowel sounds  Ext: no pedal edema bilaterally  Neuro: Strength 5/5 upper and lower extremities bilaterally  Musculoskeletal: No digital cyanosis, clubbing  Skin: Left great toe gangrene  Psych: Normal affect and demeanor,  alert and oriented x3    Data Reviewed:  I have personally reviewed following labs and imaging studies  Micro Results Recent Results (from the past 240 hour(s))  Blood culture (routine x 2)     Status: None (Preliminary result)   Collection Time: 08/10/20 11:53 PM   Specimen: BLOOD RIGHT FOREARM  Result Value Ref Range Status   Specimen Description BLOOD  RIGHT FOREARM  Final   Special Requests   Final    BOTTLES DRAWN AEROBIC AND ANAEROBIC Blood Culture adequate volume   Culture   Final    NO GROWTH 3 DAYS Performed at [facility], [address], [place], [place] [address]    Report Status PENDING  Incomplete  Blood culture (routine x 2)     Status: None (Preliminary result)   Collection Time: 08/11/20 12:00 AM   Specimen: BLOOD RIGHT ARM  Result Value Ref Range Status   Specimen Description BLOOD RIGHT ARM  Final   Special Requests   Final    BOTTLES DRAWN AEROBIC AND ANAEROBIC Blood Culture results may not be optimal due to an excessive volume of blood received in culture bottles   Culture   Final    NO GROWTH 3 DAYS Performed at [facility], [address], [place], [place] [address]    Report Status PENDING  Incomplete  Resp Panel by RT-PCR (Flu A&B, Covid) Nasopharyngeal Swab     Status: None   Collection Time: 08/11/20  1:07 AM   Specimen: Nasopharyngeal Swab; Nasopharyngeal(NP) swabs in vial transport medium  Result Value Ref Range Status   SARS Coronavirus 2 by RT PCR NEGATIVE NEGATIVE Final    Comment: (NOTE) SARS-CoV-2 target nucleic acids are NOT DETECTED.  The SARS-CoV-2 RNA is generally detectable in upper respiratory specimens during the acute phase of infection. The lowest concentration of SARS-CoV-2 viral copies this assay can detect is 138 copies/mL. A negative result does not preclude SARS-Cov-2 infection and should not be used as the sole basis for treatment or other patient management decisions. A negative result may occur with  improper specimen  collection/handling, submission of specimen other than nasopharyngeal swab, presence of viral mutation(s) within the areas targeted by this assay, and inadequate number of viral copies(<138 copies/mL). A negative result must be combined with clinical observations, patient history, and epidemiological information. The expected result is Negative.  Fact Sheet for Patients:  BloggerCourse.com  Fact Sheet for Healthcare Providers:  SeriousBroker.it  This test is no t yet approved or cleared by the [place] FDA and  has been authorized for detection and/or diagnosis of SARS-CoV-2 by FDA under an Emergency Use Authorization (EUA). This EUA will remain  in effect (meaning this test can be used) for the duration of the COVID-19 declaration under Section 564(b)(1) of the Act, 21 U.S.C.section 360bbb-3(b)(1), unless the authorization is terminated  or revoked sooner.       Influenza A by PCR NEGATIVE NEGATIVE Final   Influenza B by PCR NEGATIVE NEGATIVE Final    Comment: (NOTE) The Xpert Xpress SARS-CoV-2/FLU/RSV plus assay is intended as an aid in the diagnosis of influenza from Nasopharyngeal swab specimens and should not be used as a sole basis for treatment. Nasal washings and aspirates are unacceptable for Xpert Xpress SARS-CoV-2/FLU/RSV testing.  Fact Sheet for Patients: BloggerCourse.com  Fact Sheet for Healthcare Providers: SeriousBroker.it  This test is not yet approved or cleared by the [place] FDA and has been authorized for detection and/or diagnosis of SARS-CoV-2 by FDA under an Emergency Use Authorization (EUA). This EUA will remain in effect (meaning this test can be used) for the duration of the COVID-19 declaration under Section 564(b)(1) of the Act, 21 U.S.C. section 360bbb-3(b)(1), unless the authorization is terminated or revoked.  Performed at [facility], [address], [place], [place] [address]   MRSA PCR Screening     Status: None   Collection  Time: 08/11/20  9:12 AM   Specimen: Nasal Mucosa; Nasopharyngeal  Result Value Ref Range Status   MRSA by PCR NEGATIVE NEGATIVE Final    Comment:        The GeneXpert MRSA Assay (FDA approved for NASAL specimens only), is one component of a comprehensive MRSA colonization surveillance program. It is not intended to diagnose MRSA infection nor to guide or monitor treatment for MRSA infections. Performed at [facility], [address], [place], [place] [address]     Radiology Reports CT Angio Chest PE W and/or Wo Contrast  Result Date: 08/11/2020 CLINICAL DATA:  Pulmonary embolus suspected. Swelling left lower extremity. Injured for months. EXAM: CT ANGIOGRAPHY CHEST WITH CONTRAST TECHNIQUE: Multidetector CT imaging of the chest was performed using the standard protocol during bolus administration of intravenous contrast. Multiplanar CT image reconstructions and MIPs were obtained to evaluate the vascular anatomy. CONTRAST:  OMNIPAQUE IOHEXOL 350 MG/ML SOLN COMPARISON:  Chest x-ray 08/10/2020 FINDINGS: Cardiovascular: Satisfactory opacification of the pulmonary arteries to the segmental level. No evidence of pulmonary embolism. The main pulmonary artery is normal in caliber. Mild atherosclerotic plaque of the thoracic aorta. severe left anterior descending left circumflex as well as at least mild right main coronary artery calcifications. Mediastinum/Nodes: No enlarged mediastinal, hilar, or axillary lymph nodes. Thyroid gland, trachea, and esophagus demonstrate no significant findings. Lungs/Pleura: Centrilobular emphysematous changes. Bilateral lower lobe subsegmental atelectasis. Limited evaluation for pulmonary nodularity due to motion artifact. No pulmonary mass. No focal consolidation. No pleural effusion. No pneumothorax. Upper Abdomen: Status post cholecystectomy. Atherosclerotic  plaque. No acute abnormality. Musculoskeletal: No chest wall abnormality. Likely bone island of the left posterior eighth rib. No suspicious lytic or blastic osseous lesions. No acute displaced fracture. Review of the MIP images confirms the above findings. IMPRESSION: 1. No central or segmental pulmonary embolus. Limited evaluation at the subsegmental level due to motion artifact. 2. No acute intrapulmonary abnormality. 3. Aortic Atherosclerosis (ICD10-I70.0) including 3 vessel mild to severe coronary artery calcification. 4.  Emphysema (ICD10-J43.9). Electronically Signed   By: Tish Frederickson M.D.   On: 08/11/2020 04:06   US ARTERIAL ABI (SCREENING LOWER EXTREMITY)  Result Date: 08/12/2020 CLINICAL DATA:  Left first toe gangrene.  History of hypertension. EXAM: NONINVASIVE PHYSIOLOGIC VASCULAR STUDY OF BILATERAL LOWER EXTREMITIES TECHNIQUE: Evaluation of both lower extremities were performed at rest, including calculation of ankle-brachial indices with single level Doppler, pressure and pulse volume recording. COMPARISON:  None. FINDINGS: Right ABI:  0.65 Left ABI:  0.51 Right Lower Extremity: Monophasic posterior tibial and dorsalis pedis waveforms. Left Lower Extremity: Monophasic posterior tibial and dorsalis pedis waveforms. 0.5-0.79 Moderate PAD IMPRESSION: Evidence of moderate peripheral vascular disease in both lower extremities with resting ankle-brachial indices of 0.65 on the right and 0.51 on the left. Distal waveforms are monophasic bilaterally. Consider further evaluation with CT angiography of the abdominal aorta with bilateral iliofemoral runoff for further characterization of arterial occlusive disease. Electronically Signed   By: Irish Lack M.D.   On: 08/12/2020 11:05   DG Chest Portable 1 View  Result Date: 08/10/2020 CLINICAL DATA:  Hypoxia EXAM: PORTABLE CHEST 1 VIEW COMPARISON:  04/19/2020 FINDINGS: The lungs are hyperinflated with diffuse interstitial prominence. No focal  airspace consolidation or pulmonary edema. No pleural effusion or pneumothorax. Normal cardiomediastinal contours. IMPRESSION: COPD without acute airspace disease. Electronically Signed   By: Deatra Robinson M.D.   On: 08/10/2020 23:58   DG Foot Complete Left  Result Date: 08/10/2020 CLINICAL DATA:  Left great  toe swelling. EXAM: LEFT FOOT - COMPLETE 3+ VIEW COMPARISON:  None. FINDINGS: There is no evidence of fracture or dislocation. There is diffuse osteopenia with mild degenerative changes seen along the dorsal aspect of the proximal to mid left foot. Marked severity soft tissue swelling is seen throughout the left great toe. IMPRESSION: Marked severity soft tissue swelling throughout the left great toe without evidence of an acute osseous abnormality. Electronically Signed   By: Aram Candela M.D.   On: 08/10/2020 22:10   ECHOCARDIOGRAM COMPLETE  Result Date: 08/12/2020    ECHOCARDIOGRAM REPORT   Patient Name:   REYNA LORENZI Date of Exam: 08/12/2020 Medical Rec #:  [id number]        Height:       62.0 in Accession #:    [id number]       Weight:       98.5 lb Date of Birth:  [birth year]        BSA:          1.415 m Patient Age:    65 years         BP:           171/85 mmHg Patient Gender: F                HR:           88 bpm. [facility] Procedure: 2D Echo, Cardiac Doppler and Color Doppler Indications:    Abnormal ECG R94.31  History:        Patient has prior history of Echocardiogram examinations, most                 recent 04/19/2020. CHF, Previous Myocardial Infarction, COPD;                 Risk Factors:Hypertension. Tobacco abuse.  Sonographer:    Celesta Gentile RCS Referring Phys: [id number] PRATIK D [facility] IMPRESSIONS  1. Hypokinesis of the distal anteroseptal distal anterolateral wall; akinesis of the apex. . Left ventricular ejection fraction, by estimation, is 55 to 60%. The left ventricle has normal function. Left ventricular diastolic parameters are consistent with Grade II  diastolic dysfunction (pseudonormalization).  2. Right ventricular systolic function is normal. The right ventricular size is normal. Mildly increased right ventricular wall thickness. There is moderately elevated pulmonary artery systolic pressure.  3. Left atrial size was severely dilated.  4. MR is predominantly cnetral, though some is directed posterior into LA. . Moderate to severe mitral valve regurgitation.  5. Tricuspid valve regurgitation is moderate.  6. The aortic valve is tricuspid. Aortic valve regurgitation is mild. Mild aortic valve sclerosis is present, with no evidence of aortic valve stenosis.  7. The inferior vena cava is dilated in size with <50% respiratory variability, suggesting right atrial pressure of 15 mmHg. FINDINGS  Left Ventricle: Hypokinesis of the distal anteroseptal distal anterolateral wall; akinesis of the apex. Left ventricular ejection fraction, by estimation, is 55 to 60%. The left ventricle has normal function. The left ventricular internal cavity size was normal in size. There is no left ventricular hypertrophy. Left ventricular diastolic parameters are consistent with Grade II diastolic dysfunction (pseudonormalization). Right Ventricle: The right ventricular size is normal. Mildly increased right ventricular wall thickness. Right ventricular systolic function is normal. There is moderately elevated pulmonary artery systolic pressure. The tricuspid regurgitant velocity is 3.27 m/s, and with an assumed right atrial pressure of 8 mmHg, the estimated right ventricular systolic pressure is 50.8 mmHg. Left Atrium:  Left atrial size was severely dilated. Right Atrium: Right atrial size was normal in size. Pericardium: There is no evidence of pericardial effusion. Mitral Valve: MR is predominantly cnetral, though some is directed posterior into LA. There is mild thickening of the mitral valve leaflet(s). Moderate to severe mitral valve regurgitation. Tricuspid Valve: The tricuspid  valve is normal in structure. Tricuspid valve regurgitation is moderate. Aortic Valve: The aortic valve is tricuspid. Aortic valve regurgitation is mild. Mild aortic valve sclerosis is present, with no evidence of aortic valve stenosis. Pulmonic Valve: The pulmonic valve was not well visualized. Aorta: The aortic root is normal in size and structure. Venous: The inferior vena cava is dilated in size with less than 50% respiratory variability, suggesting right atrial pressure of 15 mmHg.  LEFT VENTRICLE PLAX 2D LVIDd:         4.00 cm  Diastology LVIDs:         2.60 cm  LV e' medial:    5.98 cm/s LV PW:         1.10 cm  LV E/e' medial:  20.7 LV IVS:        1.10 cm  LV e' lateral:   8.49 cm/s LVOT diam:     1.90 cm  LV E/e' lateral: 14.6 LV SV:         63 LV SV Index:   44 LVOT Area:     2.84 cm  RIGHT VENTRICLE RV S prime:     11.70 cm/s TAPSE (M-mode): 2.0 cm LEFT ATRIUM             Index       RIGHT ATRIUM           Index LA diam:        3.10 cm 2.19 cm/m  RA Area:     15.70 cm LA Vol (A2C):   61.4 ml 43.40 ml/m RA Volume:   40.00 ml  28.27 ml/m LA Vol (A4C):   66.5 ml 47.00 ml/m LA Biplane Vol: 64.1 ml 45.31 ml/m  AORTIC VALVE LVOT Vmax:   117.00 cm/s LVOT Vmean:  78.300 cm/s LVOT VTI:    0.222 m  AORTA Ao Root diam: 2.70 cm MITRAL VALVE                 TRICUSPID VALVE MV Area (PHT): 3.46 cm      TR Peak grad:   42.8 mmHg MV Decel Time: 219 msec      TR Vmax:        327.00 cm/s MR Peak grad:    136.4 mmHg MR Mean grad:    86.0 mmHg   SHUNTS MR Vmax:         584.00 cm/s Systemic VTI:  0.22 m MR Vmean:        437.0 cm/s  Systemic Diam: 1.90 cm MR PISA:         2.26 cm MR PISA Eff ROA: 9 mm MR PISA Radius:  0.60 cm MV E velocity: 124.00 cm/s MV A velocity: 81.00 cm/s MV E/A ratio:  1.53 Dietrich Pates MD Electronically signed by Dietrich Pates MD Signature Date/Time: 08/12/2020/7:26:41 PM    Final     Lab Data:  CBC: Recent Labs  Lab 08/10/20 2207 08/12/20 0802 08/13/20 0219 08/14/20 0343  WBC 7.3 12.0*  10.5 11.3*  HGB 10.0* 9.3* 9.3* 8.7*  HCT 30.5* 29.5* 29.0* 27.2*  MCV 91.9 93.7 92.4 91.9  PLT 183 176 170 169   Basic Metabolic  Panel: Recent Labs  Lab 08/10/20 2207 08/10/20 2352 08/11/20 0552 08/12/20 0802 08/13/20 0219 08/14/20 0343  NA 142  --  142 144 143 141  K 2.4*  --  2.5* 3.2* 3.4* 3.9  CL 106  --  101 109 108 109  CO2 25  --  29 23 25 23   GLUCOSE 105*  --  102* 110* 115* 103*  BUN 23  --  18 18 19 17   CREATININE 0.70  --  0.51 0.47 0.61 0.76  CALCIUM 9.2  --  9.0 9.2 9.4 9.2  MG  --  1.7  --  1.7 1.7 2.1   GFR: Estimated Creatinine Clearance: 48.3 mL/min (by C-G formula based on SCr of 0.76 mg/dL). Liver Function Tests: No results for input(s): AST, ALT, ALKPHOS, BILITOT, PROT, ALBUMIN in the last 168 hours. No results for input(s): LIPASE, AMYLASE in the last 168 hours. No results for input(s): AMMONIA in the last 168 hours. Coagulation Profile: No results for input(s): INR, PROTIME in the last 168 hours. Cardiac Enzymes: No results for input(s): CKTOTAL, CKMB, CKMBINDEX, TROPONINI in the last 168 hours. BNP (last 3 results) No results for input(s): PROBNP in the last 8760 hours. HbA1C: No results for input(s): HGBA1C in the last 72 hours. CBG: No results for input(s): GLUCAP in the last 168 hours. Lipid Profile: No results for input(s): CHOL, HDL, LDLCALC, TRIG, CHOLHDL, LDLDIRECT in the last 72 hours. Thyroid Function Tests: No results for input(s): TSH, T4TOTAL, FREET4, T3FREE, THYROIDAB in the last 72 hours. Anemia Panel: No results for input(s): VITAMINB12, FOLATE, FERRITIN, TIBC, IRON, RETICCTPCT in the last 72 hours. Urine analysis: No results found for: COLORURINE, APPEARANCEUR, LABSPEC, PHURINE, GLUCOSEU, HGBUR, BILIRUBINUR, KETONESUR, PROTEINUR, UROBILINOGEN, NITRITE, LEUKOCYTESUR   Ripudeep Rai M.D. [facility] 08/14/2020, 3:31 PM  Available via Epic secure chat 7am-7pm After 7 pm, please refer to night coverage provider listed on  amion.

## 2020-08-14 NOTE — Progress Notes (Signed)
ANTICOAGULATION CONSULT NOTE  Pharmacy Consult for heparin  Indication: atrial fibrillation  No Known Allergies  Patient Measurements: Height: 5\' 2"  (157.5 cm) Weight: 43.1 kg (95 lb 1.6 oz) IBW/kg (Calculated) : 50.1 Heparin Dosing Weight: HEPARIN DW (KG): 44.7   Vital Signs: Temp: 97.2 F (36.2 C) (03/16 0459) Temp Source: Oral (03/16 0459) BP: 116/74 (03/16 1016) Pulse Rate: 89 (03/16 1016)  Labs: Recent Labs    08/12/20 0802 08/12/20 1527 08/13/20 0219 08/13/20 1100 08/14/20 0343  HGB 9.3*  --  9.3*  --  8.7*  HCT 29.5*  --  29.0*  --  27.2*  PLT 176  --  170  --  169  HEPARINUNFRC 0.38   < > 0.23* 0.32 0.29*  CREATININE 0.47  --  0.61  --  0.76   < > = values in this interval not displayed.    Estimated Creatinine Clearance: 48.3 mL/min (by C-G formula based on SCr of 0.76 mg/dL).   Assessment: 65 y.o. female with Afib on heparin. She is noted with PAD and plans for angiogram on Friday.  -heparin level slightly below goal -hg= 9.3   Goal of Therapy:  Heparin level 0.3-0.7 units/ml Monitor platelets by anticoagulation protocol: Yes   Plan:  -Increase heparin to 1300 units/hr -Heparin daily wth CBC daily  Monday, PharmD Clinical Pharmacist **Pharmacist phone directory can now be found on [email or web address] ([email or web address]).  Listed under [facility].

## 2020-08-15 ENCOUNTER — Inpatient Hospital Stay (HOSPITAL_COMMUNITY): Payer: Self-pay

## 2020-08-15 ENCOUNTER — Other Ambulatory Visit (HOSPITAL_COMMUNITY): Payer: Self-pay

## 2020-08-15 DIAGNOSIS — I483 Typical atrial flutter: Secondary | ICD-10-CM

## 2020-08-15 DIAGNOSIS — I10 Essential (primary) hypertension: Secondary | ICD-10-CM

## 2020-08-15 LAB — CBC
HCT: 25.5 % — ABNORMAL LOW (ref 36.0–46.0)
Hemoglobin: 8 g/dL — ABNORMAL LOW (ref 12.0–15.0)
MCH: 29.4 pg (ref 26.0–34.0)
MCHC: 31.4 g/dL (ref 30.0–36.0)
MCV: 93.8 fL (ref 80.0–100.0)
Platelets: 167 10*3/uL (ref 150–400)
RBC: 2.72 MIL/uL — ABNORMAL LOW (ref 3.87–5.11)
RDW: 16.1 % — ABNORMAL HIGH (ref 11.5–15.5)
WBC: 11.3 10*3/uL — ABNORMAL HIGH (ref 4.0–10.5)
nRBC: 0 % (ref 0.0–0.2)

## 2020-08-15 LAB — HEPARIN LEVEL (UNFRACTIONATED): Heparin Unfractionated: 0.35 IU/mL (ref 0.30–0.70)

## 2020-08-15 LAB — OCCULT BLOOD X 1 CARD TO LAB, STOOL: Fecal Occult Bld: NEGATIVE

## 2020-08-15 MED ORDER — DILTIAZEM HCL ER COATED BEADS 120 MG PO CP24
120.0000 mg | ORAL_CAPSULE | Freq: Once | ORAL | Status: AC
  Administered 2020-08-15: 120 mg via ORAL
  Filled 2020-08-15: qty 1

## 2020-08-15 MED ORDER — ISOSORBIDE MONONITRATE ER 60 MG PO TB24
60.0000 mg | ORAL_TABLET | Freq: Every day | ORAL | Status: DC
  Administered 2020-08-15 – 2020-08-21 (×6): 60 mg via ORAL
  Filled 2020-08-15 (×6): qty 1

## 2020-08-15 MED ORDER — OXYCODONE HCL 5 MG PO TABS
5.0000 mg | ORAL_TABLET | ORAL | Status: DC | PRN
  Administered 2020-08-15 – 2020-08-21 (×15): 5 mg via ORAL
  Filled 2020-08-15 (×15): qty 1

## 2020-08-15 MED ORDER — HYDROMORPHONE HCL 1 MG/ML IJ SOLN
1.0000 mg | INTRAMUSCULAR | Status: DC | PRN
  Administered 2020-08-15 – 2020-08-20 (×7): 1 mg via INTRAVENOUS
  Filled 2020-08-15 (×7): qty 1

## 2020-08-15 MED ORDER — SODIUM CHLORIDE 0.9 % IV SOLN: INTRAVENOUS | Status: DC

## 2020-08-15 MED ORDER — DILTIAZEM HCL ER COATED BEADS 180 MG PO CP24
360.0000 mg | ORAL_CAPSULE | Freq: Every day | ORAL | Status: DC
  Administered 2020-08-16 – 2020-08-21 (×5): 360 mg via ORAL
  Filled 2020-08-15 (×5): qty 2

## 2020-08-15 NOTE — Progress Notes (Addendum)
Progress Note  Patient Name: Sarah Bonilla Date of Encounter: 08/15/2020  [facility] Cardiologist: Dina Rich, MD   Subjective   Having renal ultrasound  Inpatient Medications    Scheduled Meds: . carvedilol  25 mg Oral BID WC  . diltiazem  240 mg Oral Daily  . hydrALAZINE  100 mg Oral Q8H  . isosorbide mononitrate  60 mg Oral Daily  . lisinopril  40 mg Oral Daily  . metroNIDAZOLE  500 mg Oral Q8H  . pantoprazole  40 mg Oral Daily  . sodium chloride flush  3 mL Intravenous Q12H  . spironolactone  25 mg Oral Daily   Continuous Infusions: . sodium chloride Stopped (08/11/20 2249)  . cefTRIAXone (ROCEPHIN)  IV 1 g (08/14/20 1142)  . heparin 1,300 Units/hr (08/15/20 0409)  . vancomycin Stopped (08/15/20 0008)   PRN Meds: sodium chloride, acetaminophen **OR** acetaminophen, labetalol, levalbuterol, morphine injection, ondansetron **OR** ondansetron (ZOFRAN) IV, sodium chloride flush   Vital Signs    Vitals:   08/14/20 2019 08/15/20 0557 08/15/20 0558 08/15/20 0749  BP: 140/67 (!) 164/89 (!) 164/89 (!) 154/74  Pulse: 85  87 86  Resp:   18 16  Temp: 98 F (36.7 C)  98.1 F (36.7 C) (!) 97.2 F (36.2 C)  TempSrc: Oral  Oral Oral  SpO2: 96%  98% 97%  Weight:   45 kg   Height:        Intake/Output Summary (Last 24 hours) at 08/15/2020 0805 Last data filed at 08/15/2020 0409 Gross per 24 hour  Intake 1286.84 ml  Output --  Net 1286.84 ml   Last 3 Weights 08/15/2020 08/14/2020 08/13/2020  Weight (lbs) 99 lb 3.3 oz 95 lb 1.6 oz 98 lb 8.7 oz  Weight (kg) 45 kg 43.137 kg 44.7 kg      Telemetry    Appears SR with freq PACs  - Personally Reviewed  ECG    No new - Personally Reviewed  Physical Exam  Per Dr. Delton See GEN: No acute distress.   Neck: No JVD Cardiac: RRR, no murmurs, rubs, or gallops.  Respiratory: Clear to auscultation bilaterally. GI: Soft, nontender, non-distended  MS: No edema; No deformity. Neuro:  Nonfocal  Psych: Normal affect    Labs    High Sensitivity Troponin:  No results for input(s): TROPONINIHS in the last 720 hours.    Chemistry Recent Labs  Lab 08/12/20 0802 08/13/20 0219 08/14/20 0343  NA 144 143 141  K 3.2* 3.4* 3.9  CL 109 108 109  CO2 23 25 23   GLUCOSE 110* 115* 103*  BUN 18 19 17   CREATININE 0.47 0.61 0.76  CALCIUM 9.2 9.4 9.2  GFRNONAA >60 >60 >60  ANIONGAP 12 10 9      Hematology Recent Labs  Lab 08/13/20 0219 08/14/20 0343 08/15/20 0219  WBC 10.5 11.3* 11.3*  RBC 3.14* 2.96* 2.72*  HGB 9.3* 8.7* 8.0*  HCT 29.0* 27.2* 25.5*  MCV 92.4 91.9 93.8  MCH 29.6 29.4 29.4  MCHC 32.1 32.0 31.4  RDW 15.9* 16.3* 16.1*  PLT 170 169 167    BNP Recent Labs  Lab 08/10/20 2352  BNP 2,259.0*     DDimer  Recent Labs  Lab 08/10/20 2352  DDIMER 0.70*     Radiology    No results found.  Cardiac Studies   08/17/20 ARTERIAL ABI (SCREENING LOWER EXTREMITY)  Result Date: 08/12/2020 CLINICAL DATA: Left first toe gangrene. History of hypertension.  Evidence of moderate peripheral vascular disease in both  lower extremities with resting ankle-brachial indices of 0.65 on the right and 0.51 on the left. Distal waveforms are monophasic bilaterally. Consider further evaluation with CT angiography of the abdominal aorta with bilateral iliofemoral runoff for further characterization of arterial occlusive disease.  ECHOCARDIOGRAM COMPLETE Result Date: 08/12/2020  1. Hypokinesis of the distal anteroseptal distal anterolateral wall;  akinesis of the apex. . Left ventricular ejection fraction, by estimation,  is 55 to 60%. The left ventricle has normal function. Left ventricular  diastolic parameters are consistent  with Grade II diastolic dysfunction (pseudonormalization).  2. Right ventricular systolic function is normal. The right ventricular  size is normal. Mildly increased right ventricular wall thickness. There  is moderately elevated pulmonary artery systolic pressure.  3.  Left atrial size was severely dilated.  4. MR is predominantly cnetral, though some is directed posterior into  LA. . Moderate to severe mitral valve regurgitation.  5. Tricuspid valve regurgitation is moderate.  6. The aortic valve is tricuspid. Aortic valve regurgitation is mild.  Mild aortic valve sclerosis is present, with no evidence of aortic valve  stenosis.  7. The inferior vena cava is dilated in size with <50% respiratory  variability, suggesting right atrial pressure of 15 mmHg.    Patient Profile     65 y.o. female with a hx of uncontrolled HTNand now admitted with HTN emergency and atrial flutter.  Assessment & Plan    1. Atrial flutter. She is back in SR, on iv Heparin  She would have runs of tachycardia, changed amlodipine to dilt 240   2. Hypertension elevated 197/101 to 154/74 - add imdur 30 mg po daily - check renal arterial US  having done this AM   3 SOB/Edema Pt's JVP is elevated With that and mild rales and trib edema I would give IV lasix x 1 - LVEF 55-60%, appears euvolemic - +350,  wt down from 44.7 Kg to 43.1 Kg though today up to 45 Kg  4.Hx LEgangrene, seen by Dr Darrick Penna from vascular surgery: -gangrene left first toe for about 2 weeks. Has ABI 0.5. Will plan for aortogram with bilat runoff possible intervention on Friday. If pt other medical issues resolved prior to this she can go home on oral antibiotics and return as outpt. -On heparin  5.  CAD on CTA of chest 3 vessel mild to severe coronary artery calcification   6.  Anemia,  Drifting down now 8.0 per IM     For questions or updates, please contact [facility] Please consult [email or web address] for contact info under     Signed, Nada Boozer, NP  08/15/2020, 8:05 AM    The patient was seen, examined and discussed with Nada Boozer, NP and I agree with the above.   The patient remains in sinus rhythm with frequent PVCs, she is awaiting lower extremity angiogram  scheduled for tomorrow, she remains hypertensive with heart rate in 80s and frequent PVCs, I will increase Cardizem to 360 mg daily.  Her renal arterial ultrasound showed 1-59% stenosis of the right renal artery. Abnormal right Resistive Index. Normal size right kidney. Left: No evidence of left renal artery stenosis. Abnormal left Resisitve Index. Normal size of left kidney.  She appears euvolemic.  Tobias Alexander, MD 08/15/2020

## 2020-08-15 NOTE — Progress Notes (Signed)
  Progress Note    08/15/2020 7:40 AM * No surgery date entered *  Subjective:  No complaints   Vitals:   08/15/20 0557 08/15/20 0558  BP: (!) 164/89 (!) 164/89  Pulse:  87  Resp:  18  Temp:  98.1 F (36.7 C)  SpO2:  98%   Physical Exam: Lungs:  Non labored Extremities:  L GT gangrene Neurologic: A&O  CBC    Component Value Date/Time   WBC 11.3 (H) 08/15/2020 0219   RBC 2.72 (L) 08/15/2020 0219   HGB 8.0 (L) 08/15/2020 0219   HCT 25.5 (L) 08/15/2020 0219   PLT 167 08/15/2020 0219   MCV 93.8 08/15/2020 0219   MCH 29.4 08/15/2020 0219   MCHC 31.4 08/15/2020 0219   RDW 16.1 (H) 08/15/2020 0219   LYMPHSABS 1.8 04/19/2020 0255   MONOABS 0.6 04/19/2020 0255   EOSABS 0.0 04/19/2020 0255   BASOSABS 0.0 04/19/2020 0255    BMET    Component Value Date/Time   NA 141 08/14/2020 0343   K 3.9 08/14/2020 0343   CL 109 08/14/2020 0343   CO2 23 08/14/2020 0343   GLUCOSE 103 (H) 08/14/2020 0343   BUN 17 08/14/2020 0343   CREATININE 0.76 08/14/2020 0343   CALCIUM 9.2 08/14/2020 0343   GFRNONAA >60 08/14/2020 0343   GFRAA >60 12/20/2017 0915    INR    Component Value Date/Time   INR 1.2 04/20/2020 0502     Intake/Output Summary (Last 24 hours) at 08/15/2020 0740 Last data filed at 08/15/2020 0409 Gross per 24 hour  Intake 1286.84 ml  Output --  Net 1286.84 ml     Assessment/Plan:  65 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 Darrick Penna All questions answered and patient willing to proceed NPO past midnight Consent   Emilie Rutter, PA-C [facility] [phone] 08/15/2020 7:40 AM

## 2020-08-15 NOTE — Progress Notes (Signed)
This chaplain responded to morning page from the unit for Pt. spiritual care.  The Pt. daughter-Toya is at the bedside.  Toya volunteered to step away from the room during the chaplain's visit.  The Pt. is awake and able to identify and process the overnight stress from her medical diagnosis.  The Pt. is grateful for the care she is receiving.The chaplain listens and understands the Pt. time in the hospital has enabled her to think about what is important to her and how she wants to move forward with her life. 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   The Pt. accepted the chaplain's invitation for prayer and F/U spiritual care.

## 2020-08-15 NOTE — Progress Notes (Signed)
Renal duplex  has been completed. Refer to [facility] under chart review to view preliminary results.   08/15/2020  10:35 AM Coron Rossano, Gerarda Gunther  ;

## 2020-08-15 NOTE — Progress Notes (Signed)
[facility]                                                                              Patient Demographics  Danell Vazquez, is a 65 y.o. female, DOB - 1956-06-01, ZOX:[id number]  Admit date - 08/10/2020   Admitting Physician Frankey Shown, DO  Outpatient Primary MD for the patient is Patient, No Pcp Per  Outpatient specialists:   LOS - 4  days   Medical records reviewed and are as summarized below:    No chief complaint on file.      Brief summary   Patient is a 64 year old female with history of poorly controlled hypertension due to medication and dietary noncompliance, tobacco abuse with COPD, chronic diastolic CHF and chronic left lower extremity DVT who presented to the [facility] ED with complaints of some leg swelling over the last 1 month which started after she stubbedher left great toe.Patient was admitted with hypertensive crisis and new onset atrial flutter.  Cardiology consulted.She was also noted to have left great toe gangrene with ABI demonstrating moderate PAD. General surgery recommended transfer to [facility] for vascular surgery evaluation. Vascular surgery was consulted.  She has been started on vancomycin   Assessment & Plan    Principal Problem: Left great toe pain secondary to cellulitis/dry gangrene in the setting of moderate PAD -Continue IV vancomycin, Flagyl, Rocephin -Vascular surgery consulted, plan for angiogram on Friday and left first toe amputation, possibly bypass -Complaining of pain in the toe, added oxycodone and IV Dilaudid as needed for pain  Active Problems:   Hypertensive emergency -BP still somewhat elevated, off the Cardene drip -Cardiology following, increased Cardizem to 360 mg daily, started on Imdur, continue hydralazine, lisinopril -Renal ultrasound showed 1-59% stenosis of right renal artery, no evidence of left renal artery stenosis  Paroxysmal,  Atrial flutter (HCC), new onset -Currently rate  controlled, continue Coreg -Continue heparin drip  Normocytic anemia, possibly anemia of chronic disease - baseline hemoglobin ~8 -Hemoglobin slowly trending down, will check FOBT.  Per cardiology, continue IV heparin  Hypokalemia Resolved  Chronic left lower extremity DVT -Currently on heparin drip while vascular interventions are planned -Need to be more compliant with oral anticoagulation  COPD with nicotine abuse -Currently no wheezing, stable, continue as needed bronchodilators -Counseled on nicotine cessation   Code Status: Full CODE STATUS DVT Prophylaxis:     Level of Care: Level of care: Telemetry Cardiac Family Communication: Discussed all imaging results, lab results, explained to the patient    Disposition Plan:     Status is: Inpatient  Remains inpatient appropriate because:Inpatient level of care appropriate due to severity of illness   Dispo: The patient is from: Home              Anticipated d/c is to: Home              Patient currently is not medically stable to d/c.  Pending vascular interventions for left toe gangrene   Difficult to place patient No      Time Spent in minutes   35 minutes  Procedures:  2D echo  Consultants:  Cardiology General surgery Vascular surgery  Antimicrobials:   Anti-infectives (From admission, onward)   Start     Dose/Rate Route Frequency Ordered Stop   08/13/20 1400  metroNIDAZOLE (FLAGYL) tablet 500 mg        500 mg Oral Every 8 hours 08/13/20 1026     08/13/20 1115  cefTRIAXone (ROCEPHIN) 1 g in sodium chloride 0.9 % 100 mL IVPB        1 g 200 mL/hr over 30 Minutes Intravenous Every 24 hours 08/13/20 1026     08/11/20 2200  vancomycin (VANCOREADY) IVPB 750 mg/150 mL        750 mg 150 mL/hr over 60 Minutes Intravenous Every 24 hours 08/11/20 0019     08/11/20 0015  vancomycin (VANCOCIN) IVPB 1000 mg/200 mL premix        1,000 mg 200 mL/hr over 60 Minutes Intravenous  Once 08/11/20 0012 08/11/20 0306    08/10/20 2345  cefTRIAXone (ROCEPHIN) 1 g in sodium chloride 0.9 % 100 mL IVPB        1 g 200 mL/hr over 30 Minutes Intravenous  Once 08/10/20 2340 08/11/20 0100         Medications  Scheduled Meds: . carvedilol  25 mg Oral BID WC  . [START ON 08/16/2020] diltiazem  360 mg Oral Daily  . hydrALAZINE  100 mg Oral Q8H  . isosorbide mononitrate  60 mg Oral Daily  . lisinopril  40 mg Oral Daily  . metroNIDAZOLE  500 mg Oral Q8H  . pantoprazole  40 mg Oral Daily  . sodium chloride flush  3 mL Intravenous Q12H  . spironolactone  25 mg Oral Daily   Continuous Infusions: . sodium chloride Stopped (08/11/20 2249)  . cefTRIAXone (ROCEPHIN)  IV 1 g (08/15/20 1200)  . heparin 1,300 Units/hr (08/15/20 0409)  . vancomycin Stopped (08/15/20 0008)   PRN Meds:.sodium chloride, acetaminophen **OR** acetaminophen, HYDROmorphone (DILAUDID) injection, labetalol, levalbuterol, ondansetron **OR** ondansetron (ZOFRAN) IV, oxyCODONE, sodium chloride flush      Subjective:   Carime Dinkel was seen and examined today.  BP uncontrolled, complaining of pain 8/10 in the left great toe.  Otherwise no fevers or chills.  No chest pain or shortness of breath.  No abdominal pain nausea vomiting.  No acute events overnight.  Objective:   Vitals:   08/15/20 0557 08/15/20 0558 08/15/20 0749 08/15/20 1014  BP: (!) 164/89 (!) 164/89 (!) 154/74 (!) 137/103  Pulse:  87 86 83  Resp:  18 16   Temp:  98.1 F (36.7 C) (!) 97.2 F (36.2 C)   TempSrc:  Oral Oral   SpO2:  98% 97% 93%  Weight:  45 kg    Height:        Intake/Output Summary (Last 24 hours) at 08/15/2020 1306 Last data filed at 08/15/2020 1031 Gross per 24 hour  Intake 643.77 ml  Output 300 ml  Net 343.77 ml     Wt Readings from Last 3 Encounters:  08/15/20 45 kg  04/23/20 40 kg  12/20/17 48.5 kg   Physical Exam  General: Alert and oriented x 3, NAD  Cardiovascular: S1 S2 clear, RRR. No pedal edema b/l  Respiratory: Diminished  breath sound at the bases  Gastrointestinal: Soft, nontender, nondistended, NBS  Ext: no pedal edema bilaterally  Neuro: no new deficits  Musculoskeletal: No cyanosis, clubbing  Skin: Left great toe gangrene  Psych: Normal affect and demeanor, alert and oriented x3     Data Reviewed:  I  have personally reviewed following labs and imaging studies  Micro Results Recent Results (from the past 240 hour(s))  Blood culture (routine x 2)     Status: None (Preliminary result)   Collection Time: 08/10/20 11:53 PM   Specimen: BLOOD RIGHT FOREARM  Result Value Ref Range Status   Specimen Description BLOOD RIGHT FOREARM  Final   Special Requests   Final    BOTTLES DRAWN AEROBIC AND ANAEROBIC Blood Culture adequate volume   Culture   Final    NO GROWTH 4 DAYS Performed at [facility], [address], [place], [place] [address]    Report Status PENDING  Incomplete  Blood culture (routine x 2)     Status: None (Preliminary result)   Collection Time: 08/11/20 12:00 AM   Specimen: BLOOD RIGHT ARM  Result Value Ref Range Status   Specimen Description BLOOD RIGHT ARM  Final   Special Requests   Final    BOTTLES DRAWN AEROBIC AND ANAEROBIC Blood Culture results may not be optimal due to an excessive volume of blood received in culture bottles   Culture   Final    NO GROWTH 4 DAYS Performed at [facility], [address], [place], [place] [address]    Report Status PENDING  Incomplete  Resp Panel by RT-PCR (Flu A&B, Covid) Nasopharyngeal Swab     Status: None   Collection Time: 08/11/20  1:07 AM   Specimen: Nasopharyngeal Swab; Nasopharyngeal(NP) swabs in vial transport medium  Result Value Ref Range Status   SARS Coronavirus 2 by RT PCR NEGATIVE NEGATIVE Final    Comment: (NOTE) SARS-CoV-2 target nucleic acids are NOT DETECTED.  The SARS-CoV-2 RNA is generally detectable in upper respiratory specimens during the acute phase of infection. The lowest concentration of SARS-CoV-2 viral  copies this assay can detect is 138 copies/mL. A negative result does not preclude SARS-Cov-2 infection and should not be used as the sole basis for treatment or other patient management decisions. A negative result may occur with  improper specimen collection/handling, submission of specimen other than nasopharyngeal swab, presence of viral mutation(s) within the areas targeted by this assay, and inadequate number of viral copies(<138 copies/mL). A negative result must be combined with clinical observations, patient history, and epidemiological information. The expected result is Negative.  Fact Sheet for Patients:  BloggerCourse.com  Fact Sheet for Healthcare Providers:  SeriousBroker.it  This test is no t yet approved or cleared by the [place] FDA and  has been authorized for detection and/or diagnosis of SARS-CoV-2 by FDA under an Emergency Use Authorization (EUA). This EUA will remain  in effect (meaning this test can be used) for the duration of the COVID-19 declaration under Section 564(b)(1) of the Act, 21 U.S.C.section 360bbb-3(b)(1), unless the authorization is terminated  or revoked sooner.       Influenza A by PCR NEGATIVE NEGATIVE Final   Influenza B by PCR NEGATIVE NEGATIVE Final    Comment: (NOTE) The Xpert Xpress SARS-CoV-2/FLU/RSV plus assay is intended as an aid in the diagnosis of influenza from Nasopharyngeal swab specimens and should not be used as a sole basis for treatment. Nasal washings and aspirates are unacceptable for Xpert Xpress SARS-CoV-2/FLU/RSV testing.  Fact Sheet for Patients: BloggerCourse.com  Fact Sheet for Healthcare Providers: SeriousBroker.it  This test is not yet approved or cleared by the [place] FDA and has been authorized for detection and/or diagnosis of SARS-CoV-2 by FDA under an Emergency Use Authorization (EUA). This  EUA will remain in effect (  meaning this test can be used) for the duration of the COVID-19 declaration under Section 564(b)(1) of the Act, 21 U.S.C. section 360bbb-3(b)(1), unless the authorization is terminated or revoked.  Performed at [facility], [address], [place], [place] [address]   MRSA PCR Screening     Status: None   Collection Time: 08/11/20  9:12 AM   Specimen: Nasal Mucosa; Nasopharyngeal  Result Value Ref Range Status   MRSA by PCR NEGATIVE NEGATIVE Final    Comment:        The GeneXpert MRSA Assay (FDA approved for NASAL specimens only), is one component of a comprehensive MRSA colonization surveillance program. It is not intended to diagnose MRSA infection nor to guide or monitor treatment for MRSA infections. Performed at [facility], [address], [place], [place] [address]     Radiology Reports CT Angio Chest PE W and/or Wo Contrast  Result Date: 08/11/2020 CLINICAL DATA:  Pulmonary embolus suspected. Swelling left lower extremity. Injured for months. EXAM: CT ANGIOGRAPHY CHEST WITH CONTRAST TECHNIQUE: Multidetector CT imaging of the chest was performed using the standard protocol during bolus administration of intravenous contrast. Multiplanar CT image reconstructions and MIPs were obtained to evaluate the vascular anatomy. CONTRAST:  OMNIPAQUE IOHEXOL 350 MG/ML SOLN COMPARISON:  Chest x-ray 08/10/2020 FINDINGS: Cardiovascular: Satisfactory opacification of the pulmonary arteries to the segmental level. No evidence of pulmonary embolism. The main pulmonary artery is normal in caliber. Mild atherosclerotic plaque of the thoracic aorta. severe left anterior descending left circumflex as well as at least mild right main coronary artery calcifications. Mediastinum/Nodes: No enlarged mediastinal, hilar, or axillary lymph nodes. Thyroid gland, trachea, and esophagus demonstrate no significant findings. Lungs/Pleura: Centrilobular emphysematous changes.  Bilateral lower lobe subsegmental atelectasis. Limited evaluation for pulmonary nodularity due to motion artifact. No pulmonary mass. No focal consolidation. No pleural effusion. No pneumothorax. Upper Abdomen: Status post cholecystectomy. Atherosclerotic plaque. No acute abnormality. Musculoskeletal: No chest wall abnormality. Likely bone island of the left posterior eighth rib. No suspicious lytic or blastic osseous lesions. No acute displaced fracture. Review of the MIP images confirms the above findings. IMPRESSION: 1. No central or segmental pulmonary embolus. Limited evaluation at the subsegmental level due to motion artifact. 2. No acute intrapulmonary abnormality. 3. Aortic Atherosclerosis (ICD10-I70.0) including 3 vessel mild to severe coronary artery calcification. 4.  Emphysema (ICD10-J43.9). Electronically Signed   By: Tish Frederickson M.D.   On: 08/11/2020 04:06   US ARTERIAL ABI (SCREENING LOWER EXTREMITY)  Result Date: 08/12/2020 CLINICAL DATA:  Left first toe gangrene.  History of hypertension. EXAM: NONINVASIVE PHYSIOLOGIC VASCULAR STUDY OF BILATERAL LOWER EXTREMITIES TECHNIQUE: Evaluation of both lower extremities were performed at rest, including calculation of ankle-brachial indices with single level Doppler, pressure and pulse volume recording. COMPARISON:  None. FINDINGS: Right ABI:  0.65 Left ABI:  0.51 Right Lower Extremity: Monophasic posterior tibial and dorsalis pedis waveforms. Left Lower Extremity: Monophasic posterior tibial and dorsalis pedis waveforms. 0.5-0.79 Moderate PAD IMPRESSION: Evidence of moderate peripheral vascular disease in both lower extremities with resting ankle-brachial indices of 0.65 on the right and 0.51 on the left. Distal waveforms are monophasic bilaterally. Consider further evaluation with CT angiography of the abdominal aorta with bilateral iliofemoral runoff for further characterization of arterial occlusive disease. Electronically Signed   By: Irish Lack M.D.   On: 08/12/2020 11:05   DG Chest Portable 1 View  Result Date: 08/10/2020 CLINICAL DATA:  Hypoxia EXAM: PORTABLE CHEST 1 VIEW COMPARISON:  04/19/2020 FINDINGS: The lungs  are hyperinflated with diffuse interstitial prominence. No focal airspace consolidation or pulmonary edema. No pleural effusion or pneumothorax. Normal cardiomediastinal contours. IMPRESSION: COPD without acute airspace disease. Electronically Signed   By: Deatra Robinson M.D.   On: 08/10/2020 23:58   DG Foot Complete Left  Result Date: 08/10/2020 CLINICAL DATA:  Left great toe swelling. EXAM: LEFT FOOT - COMPLETE 3+ VIEW COMPARISON:  None. FINDINGS: There is no evidence of fracture or dislocation. There is diffuse osteopenia with mild degenerative changes seen along the dorsal aspect of the proximal to mid left foot. Marked severity soft tissue swelling is seen throughout the left great toe. IMPRESSION: Marked severity soft tissue swelling throughout the left great toe without evidence of an acute osseous abnormality. Electronically Signed   By: Aram Candela M.D.   On: 08/10/2020 22:10   ECHOCARDIOGRAM COMPLETE  Result Date: 08/12/2020    ECHOCARDIOGRAM REPORT   Patient Name:   PRINCESA WILLIG Date of Exam: 08/12/2020 Medical Rec #:  [id number]        Height:       62.0 in Accession #:    [id number]       Weight:       98.5 lb Date of Birth:  Oct 15, 1955        BSA:          1.415 m Patient Age:    65 years         BP:           171/85 mmHg Patient Gender: F                HR:           88 bpm. [facility] Procedure: 2D Echo, Cardiac Doppler and Color Doppler Indications:    Abnormal ECG R94.31  History:        Patient has prior history of Echocardiogram examinations, most                 recent 04/19/2020. CHF, Previous Myocardial Infarction, COPD;                 Risk Factors:Hypertension. Tobacco abuse.  Sonographer:    Celesta Gentile RCS Referring Phys: [id number] PRATIK D [facility] IMPRESSIONS  1. Hypokinesis  of the distal anteroseptal distal anterolateral wall; akinesis of the apex. . Left ventricular ejection fraction, by estimation, is 55 to 60%. The left ventricle has normal function. Left ventricular diastolic parameters are consistent with Grade II diastolic dysfunction (pseudonormalization).  2. Right ventricular systolic function is normal. The right ventricular size is normal. Mildly increased right ventricular wall thickness. There is moderately elevated pulmonary artery systolic pressure.  3. Left atrial size was severely dilated.  4. MR is predominantly cnetral, though some is directed posterior into LA. . Moderate to severe mitral valve regurgitation.  5. Tricuspid valve regurgitation is moderate.  6. The aortic valve is tricuspid. Aortic valve regurgitation is mild. Mild aortic valve sclerosis is present, with no evidence of aortic valve stenosis.  7. The inferior vena cava is dilated in size with <50% respiratory variability, suggesting right atrial pressure of 15 mmHg. FINDINGS  Left Ventricle: Hypokinesis of the distal anteroseptal distal anterolateral wall; akinesis of the apex. Left ventricular ejection fraction, by estimation, is 55 to 60%. The left ventricle has normal function. The left ventricular internal cavity size was normal in size. There is no left ventricular hypertrophy. Left ventricular diastolic parameters are consistent with Grade II diastolic dysfunction (pseudonormalization).  Right Ventricle: The right ventricular size is normal. Mildly increased right ventricular wall thickness. Right ventricular systolic function is normal. There is moderately elevated pulmonary artery systolic pressure. The tricuspid regurgitant velocity is 3.27 m/s, and with an assumed right atrial pressure of 8 mmHg, the estimated right ventricular systolic pressure is 50.8 mmHg. Left Atrium: Left atrial size was severely dilated. Right Atrium: Right atrial size was normal in size. Pericardium: There is no  evidence of pericardial effusion. Mitral Valve: MR is predominantly cnetral, though some is directed posterior into LA. There is mild thickening of the mitral valve leaflet(s). Moderate to severe mitral valve regurgitation. Tricuspid Valve: The tricuspid valve is normal in structure. Tricuspid valve regurgitation is moderate. Aortic Valve: The aortic valve is tricuspid. Aortic valve regurgitation is mild. Mild aortic valve sclerosis is present, with no evidence of aortic valve stenosis. Pulmonic Valve: The pulmonic valve was not well visualized. Aorta: The aortic root is normal in size and structure. Venous: The inferior vena cava is dilated in size with less than 50% respiratory variability, suggesting right atrial pressure of 15 mmHg.  LEFT VENTRICLE PLAX 2D LVIDd:         4.00 cm  Diastology LVIDs:         2.60 cm  LV e' medial:    5.98 cm/s LV PW:         1.10 cm  LV E/e' medial:  20.7 LV IVS:        1.10 cm  LV e' lateral:   8.49 cm/s LVOT diam:     1.90 cm  LV E/e' lateral: 14.6 LV SV:         63 LV SV Index:   44 LVOT Area:     2.84 cm  RIGHT VENTRICLE RV S prime:     11.70 cm/s TAPSE (M-mode): 2.0 cm LEFT ATRIUM             Index       RIGHT ATRIUM           Index LA diam:        3.10 cm 2.19 cm/m  RA Area:     15.70 cm LA Vol (A2C):   61.4 ml 43.40 ml/m RA Volume:   40.00 ml  28.27 ml/m LA Vol (A4C):   66.5 ml 47.00 ml/m LA Biplane Vol: 64.1 ml 45.31 ml/m  AORTIC VALVE LVOT Vmax:   117.00 cm/s LVOT Vmean:  78.300 cm/s LVOT VTI:    0.222 m  AORTA Ao Root diam: 2.70 cm MITRAL VALVE                 TRICUSPID VALVE MV Area (PHT): 3.46 cm      TR Peak grad:   42.8 mmHg MV Decel Time: 219 msec      TR Vmax:        327.00 cm/s MR Peak grad:    136.4 mmHg MR Mean grad:    86.0 mmHg   SHUNTS MR Vmax:         584.00 cm/s Systemic VTI:  0.22 m MR Vmean:        437.0 cm/s  Systemic Diam: 1.90 cm MR PISA:         2.26 cm MR PISA Eff ROA: 9 mm MR PISA Radius:  0.60 cm MV E velocity: 124.00 cm/s MV A velocity:  81.00 cm/s MV E/A ratio:  1.53 Dietrich Pates MD Electronically signed by Dietrich Pates MD Signature Date/Time: 08/12/2020/7:26:41 PM  Final    VAS US RENAL ARTERY DUPLEX  Result Date: 08/15/2020 ABDOMINAL VISCERAL Indications: Hypertensive emergency. High Risk Factors: Hypertension, coronary artery disease. Performing Technologist: Marilynne Halsted RDMS, RVT  Examination Guidelines: A complete evaluation includes B-mode imaging, spectral Doppler, color Doppler, and power Doppler as needed of all accessible portions of each vessel. Bilateral testing is considered an integral part of a complete examination. Limited examinations for reoccurring indications may be performed as noted.  Duplex Findings: +--------------------+--------+--------+------+--------+ Mesenteric          PSV cm/sEDV cm/sPlaqueComments +--------------------+--------+--------+------+--------+ Aorta Prox             83                          +--------------------+--------+--------+------+--------+ Celiac Artery Origin  192                          +--------------------+--------+--------+------+--------+ SMA Proximal          178                          +--------------------+--------+--------+------+--------+    +------------------+--------+--------+-------+ Right Renal ArteryPSV cm/sEDV cm/sComment +------------------+--------+--------+-------+ Origin              205      35           +------------------+--------+--------+-------+ Proximal            116      17           +------------------+--------+--------+-------+ Mid                  96      17           +------------------+--------+--------+-------+ Distal               74      17           +------------------+--------+--------+-------+ +-----------------+--------+--------+-------+ Left Renal ArteryPSV cm/sEDV cm/sComment +-----------------+--------+--------+-------+ Origin             111      25            +-----------------+--------+--------+-------+ Proximal           112      28           +-----------------+--------+--------+-------+ Mid                114      29           +-----------------+--------+--------+-------+ Distal             118      19           +-----------------+--------+--------+-------+ +------------+--------+--------+----+-----------+--------+--------+----+ Right KidneyPSV cm/sEDV cm/sRI  Left KidneyPSV cm/sEDV cm/sRI   +------------+--------+--------+----+-----------+--------+--------+----+ Upper Pole  24      8       0.71Upper Pole 32      9       0.71 +------------+--------+--------+----+-----------+--------+--------+----+ Mid         31      9       0.        23      7       0.69 +------------+--------+--------+----+-----------+--------+--------+----+ Lower Pole  20      6       0.72Lower Pole 32      9  0.74 +------------+--------+--------+----+-----------+--------+--------+----+ Hilar       30      9       0.70Hilar      34      10      0.70 +------------+--------+--------+----+-----------+--------+--------+----+ +------------------+-----+------------------+-----+ Right Kidney           Left Kidney             +------------------+-----+------------------+-----+ RAR                    RAR                     +------------------+-----+------------------+-----+ RAR (manual)      2.4  RAR (manual)      1.3   +------------------+-----+------------------+-----+ Cortex                 Cortex                  +------------------+-----+------------------+-----+ Cortex thickness       Corex thickness         +------------------+-----+------------------+-----+ Kidney length (cm)11.66Kidney length (cm)10.94 +------------------+-----+------------------+-----+   Summary: Renal:  Right: 1-59% stenosis of the right renal artery. Abnormal right        Resistive Index. Normal size right kidney. Left:  No  evidence of left renal artery stenosis. Abnormal left        Resisitve Index. Normal size of left kidney.  *See table(s) above for measurements and observations.     Preliminary     Lab Data:  CBC: Recent Labs  Lab 08/10/20 2207 08/12/20 0802 08/13/20 0219 08/14/20 0343 08/15/20 0219  WBC 7.3 12.0* 10.5 11.3* 11.3*  HGB 10.0* 9.3* 9.3* 8.7* 8.0*  HCT 30.5* 29.5* 29.0* 27.2* 25.5*  MCV 91.9 93.7 92.4 91.9 93.8  PLT 183 176 170 169 167   Basic Metabolic Panel: Recent Labs  Lab 08/10/20 2207 08/10/20 2352 08/11/20 0552 08/12/20 0802 08/13/20 0219 08/14/20 0343  NA 142  --  142 144 143 141  K 2.4*  --  2.5* 3.2* 3.4* 3.9  CL 106  --  101 109 108 109  CO2 25  --  29 23 25 23   GLUCOSE 105*  --  102* 110* 115* 103*  BUN 23  --  18 18 19 17   CREATININE 0.70  --  0.51 0.47 0.61 0.76  CALCIUM 9.2  --  9.0 9.2 9.4 9.2  MG  --  1.7  --  1.7 1.7 2.1   GFR: Estimated Creatinine Clearance: 50.5 mL/min (by C-G formula based on SCr of 0.76 mg/dL). Liver Function Tests: No results for input(s): AST, ALT, ALKPHOS, BILITOT, PROT, ALBUMIN in the last 168 hours. No results for input(s): LIPASE, AMYLASE in the last 168 hours. No results for input(s): AMMONIA in the last 168 hours. Coagulation Profile: No results for input(s): INR, PROTIME in the last 168 hours. Cardiac Enzymes: No results for input(s): CKTOTAL, CKMB, CKMBINDEX, TROPONINI in the last 168 hours. BNP (last 3 results) No results for input(s): PROBNP in the last 8760 hours. HbA1C: No results for input(s): HGBA1C in the last 72 hours. CBG: No results for input(s): GLUCAP in the last 168 hours. Lipid Profile: No results for input(s): CHOL, HDL, LDLCALC, TRIG, CHOLHDL, LDLDIRECT in the last 72 hours. Thyroid Function Tests: No results for input(s): TSH, T4TOTAL, FREET4, T3FREE, THYROIDAB in the last 72 hours. Anemia Panel: No results for input(s): VITAMINB12, FOLATE, FERRITIN, TIBC, IRON, RETICCTPCT in the last 72  hours. Urine analysis: No results found for: COLORURINE, APPEARANCEUR, LABSPEC, PHURINE, GLUCOSEU, HGBUR, BILIRUBINUR, KETONESUR, PROTEINUR, UROBILINOGEN, NITRITE, LEUKOCYTESUR   Annarae Macnair M.D. [facility] 08/15/2020, 1:06 PM  Available via Epic secure chat 7am-7pm After 7 pm, please refer to night coverage provider listed on amion.

## 2020-08-16 ENCOUNTER — Inpatient Hospital Stay (HOSPITAL_COMMUNITY): Payer: Self-pay

## 2020-08-16 ENCOUNTER — Encounter (HOSPITAL_COMMUNITY): Payer: Self-pay | Admitting: Vascular Surgery

## 2020-08-16 ENCOUNTER — Encounter (HOSPITAL_COMMUNITY)
Admission: EM | Disposition: A | Discharge status: Home / Self Care | Payer: Self-pay | Source: Home / Self Care | Attending: Internal Medicine

## 2020-08-16 DIAGNOSIS — Z01810 Encounter for preprocedural cardiovascular examination: Secondary | ICD-10-CM

## 2020-08-16 HISTORY — PX: ABDOMINAL AORTOGRAM W/LOWER EXTREMITY: CATH118223

## 2020-08-16 LAB — CBC
HCT: 26.1 % — ABNORMAL LOW (ref 36.0–46.0)
Hemoglobin: 8.4 g/dL — ABNORMAL LOW (ref 12.0–15.0)
MCH: 29.7 pg (ref 26.0–34.0)
MCHC: 32.2 g/dL (ref 30.0–36.0)
MCV: 92.2 fL (ref 80.0–100.0)
Platelets: 172 10*3/uL (ref 150–400)
RBC: 2.83 MIL/uL — ABNORMAL LOW (ref 3.87–5.11)
RDW: 15.9 % — ABNORMAL HIGH (ref 11.5–15.5)
WBC: 9.7 10*3/uL (ref 4.0–10.5)
nRBC: 0 % (ref 0.0–0.2)

## 2020-08-16 LAB — SURGICAL PCR SCREEN
MRSA, PCR: NEGATIVE
Staphylococcus aureus: NEGATIVE

## 2020-08-16 LAB — BASIC METABOLIC PANEL
Anion gap: 9 (ref 5–15)
BUN: 14 mg/dL (ref 8–23)
CO2: 22 mmol/L (ref 22–32)
Calcium: 9.1 mg/dL (ref 8.9–10.3)
Chloride: 109 mmol/L (ref 98–111)
Creatinine, Ser: 0.8 mg/dL (ref 0.44–1.00)
GFR, Estimated: >60 mL/min (ref >60)
Glucose, Bld: 101 mg/dL — ABNORMAL HIGH (ref 70–99)
Potassium: 3.6 mmol/L (ref 3.5–5.1)
Sodium: 140 mmol/L (ref 135–145)

## 2020-08-16 LAB — CULTURE, BLOOD (ROUTINE X 2)
Culture: NO GROWTH
Culture: NO GROWTH
Special Requests: ADEQUATE

## 2020-08-16 LAB — HEPARIN LEVEL (UNFRACTIONATED): Heparin Unfractionated: 0.28 IU/mL — ABNORMAL LOW (ref 0.30–0.70)

## 2020-08-16 LAB — MAGNESIUM: Magnesium: 1.9 mg/dL (ref 1.7–2.4)

## 2020-08-16 SURGERY — ABDOMINAL AORTOGRAM W/LOWER EXTREMITY
Anesthesia: LOCAL | Laterality: Bilateral

## 2020-08-16 MED ORDER — DOCUSATE SODIUM 100 MG PO CAPS
100.0000 mg | ORAL_CAPSULE | Freq: Two times a day (BID) | ORAL | Status: DC
  Administered 2020-08-16 – 2020-08-21 (×5): 100 mg via ORAL
  Filled 2020-08-16 (×9): qty 1

## 2020-08-16 MED ORDER — SODIUM CHLORIDE 0.9% FLUSH
3.0000 mL | Freq: Two times a day (BID) | INTRAVENOUS | Status: DC
  Administered 2020-08-18 – 2020-08-19 (×3): 3 mL via INTRAVENOUS

## 2020-08-16 MED ORDER — IODIXANOL 320 MG/ML IV SOLN
INTRAVENOUS | Status: DC | PRN
  Administered 2020-08-16: 155 mL

## 2020-08-16 MED ORDER — POLYETHYLENE GLYCOL 3350 17 G PO PACK: 17.0000 g | PACK | Freq: Every day | ORAL | Status: DC | PRN

## 2020-08-16 MED ORDER — SODIUM CHLORIDE 0.9% FLUSH: 3.0000 mL | INTRAVENOUS | Status: DC | PRN

## 2020-08-16 MED ORDER — MORPHINE SULFATE (PF) 2 MG/ML IV SOLN
2.0000 mg | INTRAVENOUS | Status: DC | PRN
Start: 2020-08-16 — End: 2020-08-21
  Administered 2020-08-19: 2 mg via INTRAVENOUS
  Filled 2020-08-16: qty 1

## 2020-08-16 MED ORDER — LIDOCAINE HCL (PF) 1 % IJ SOLN
INTRAMUSCULAR | Status: AC
  Filled 2020-08-16: qty 30

## 2020-08-16 MED ORDER — SODIUM CHLORIDE 0.9 % IV SOLN: 250.0000 mL | INTRAVENOUS | Status: DC | PRN

## 2020-08-16 MED ORDER — MUPIROCIN 2 % EX OINT
1.0000 "application " | TOPICAL_OINTMENT | Freq: Two times a day (BID) | CUTANEOUS | Status: AC
  Administered 2020-08-16 – 2020-08-20 (×9): 1 via NASAL
  Filled 2020-08-16 (×3): qty 22

## 2020-08-16 MED ORDER — HEPARIN (PORCINE) IN NACL 1000-0.9 UT/500ML-% IV SOLN
INTRAVENOUS | Status: AC
  Filled 2020-08-16: qty 500

## 2020-08-16 MED ORDER — HEPARIN (PORCINE) IN NACL 1000-0.9 UT/500ML-% IV SOLN
INTRAVENOUS | Status: DC | PRN
  Administered 2020-08-16 (×2): 500 mL

## 2020-08-16 MED ORDER — HYDRALAZINE HCL 20 MG/ML IJ SOLN
5.0000 mg | INTRAMUSCULAR | Status: AC | PRN
  Administered 2020-08-19 – 2020-08-20 (×2): 5 mg via INTRAVENOUS
  Filled 2020-08-16 (×2): qty 1

## 2020-08-16 MED ORDER — HEPARIN (PORCINE) 25000 UT/250ML-% IV SOLN
1500.0000 [IU]/h | INTRAVENOUS | Status: DC
  Administered 2020-08-16: 1400 [IU]/h via INTRAVENOUS
  Administered 2020-08-17 – 2020-08-19 (×4): 1500 [IU]/h via INTRAVENOUS
  Filled 2020-08-16 (×7): qty 250

## 2020-08-16 MED ORDER — SODIUM CHLORIDE 0.9 % IV SOLN: INTRAVENOUS | Status: AC

## 2020-08-16 MED ORDER — LIDOCAINE HCL (PF) 1 % IJ SOLN
INTRAMUSCULAR | Status: DC | PRN
  Administered 2020-08-16: 10 mL

## 2020-08-16 SURGICAL SUPPLY — 10 items
CATH ANGIO 5F PIGTAIL 65CM (CATHETERS) ×1 IMPLANT
CATH CROSS OVER TEMPO 5F (CATHETERS) ×1 IMPLANT
CATH STRAIGHT 5FR 65CM (CATHETERS) ×1 IMPLANT
KIT PV (KITS) ×2 IMPLANT
SHEATH PINNACLE 5F 10CM (SHEATH) ×1 IMPLANT
SHEATH PROBE COVER 6X72 (BAG) ×1 IMPLANT
SYR MEDRAD MARK V 150ML (SYRINGE) ×1 IMPLANT
TRANSDUCER W/STOPCOCK (MISCELLANEOUS) ×2 IMPLANT
TRAY PV CATH (CUSTOM PROCEDURE TRAY) ×2 IMPLANT
WIRE BENTSON .035X145CM (WIRE) ×1 IMPLANT

## 2020-08-16 NOTE — Progress Notes (Signed)
ANTICOAGULATION CONSULT NOTE  Pharmacy Consult for heparin  Indication: atrial fibrillation  No Known Allergies  Patient Measurements: Height: 5\' 2"  (157.5 cm) Weight: 45.4 kg (100 lb 1.6 oz) IBW/kg (Calculated) : 50.1 Heparin Dosing Weight: HEPARIN DW (KG): 45.4   Vital Signs: Temp: 97.5 F (36.4 C) (03/18 1228) Temp Source: Oral (03/18 1228) BP: 132/69 (03/18 1228) Pulse Rate: 73 (03/18 1228)  Labs: Recent Labs    08/14/20 0343 08/15/20 0219 08/16/20 0251  HGB 8.7* 8.0* 8.4*  HCT 27.2* 25.5* 26.1*  PLT 169 167 172  HEPARINUNFRC 0.29* 0.35 0.28*  CREATININE 0.76  --  0.80    Estimated Creatinine Clearance: 50.9 mL/min (by C-G formula based on SCr of 0.8 mg/dL).   Assessment: 65 y.o. female with Afib on heparin. She is noted with PAD s/p agniogram and plans are for bypass with amputation of left first toe on 3/22. Heparin to resume at 6pm per MD - hg= 8.4  Goal of Therapy:  Heparin level 0.3-0.7 units/ml Monitor platelets by anticoagulation protocol: Yes   Plan:  -resume heparin 1400 units/hr a 6pm -Heparin level in 6 hours and daily wth CBC daily  4/22, PharmD Clinical Pharmacist **Pharmacist phone directory can now be found on [email or web address] ([email or web address]).  Listed under [facility].

## 2020-08-16 NOTE — Interval H&P Note (Signed)
History and Physical Interval Note:  08/16/2020 9:48 AM  Sarah Bonilla  has presented today for surgery, with the diagnosis of grangren.  The various methods of treatment have been discussed with the patient and family. After consideration of risks, benefits and other options for treatment, the patient has consented to  Procedure(s): ABDOMINAL AORTOGRAM W/LOWER EXTREMITY (N/A) as a surgical intervention.  The patient's history has been reviewed, patient examined, no change in status, stable for surgery.  I have reviewed the patient's chart and labs.  Questions were answered to the patient's satisfaction.     Fabienne Bruns

## 2020-08-16 NOTE — Progress Notes (Signed)
Site area: rt groin fa sheath Site Prior to Removal:  Level 0 Pressure Applied For: 20 minutes Manual:   yes Patient Status During Pull:  stable Post Pull Site:  Level 0 Post Pull Instructions Given:  yes Post Pull Pulses Present: rt dp dopplered Dressing Applied:  Gauze and tegaderm Bedrest begins @ 1205 Comments:

## 2020-08-16 NOTE — Op Note (Addendum)
Procedure: Abdominal aortogram with bilateral lower extremity runoff, second order catheterization left external iliac artery  Preoperative diagnosis: Gangrene right first toe  Postoperative diagnosis: Same  Anesthesia: Local  Operative findings: 1.  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  The patient was placed in supine position angio table.  Both groins were prepped and draped in usual sterile fashion.  Local anesthesia was notewriter of the right common femoral artery.  Ultrasound was used to identify the right common femoral artery and femoral bifurcation.  An introducer needle was used to cannulate the right common femoral artery and an 035 Bentson wire advanced up the abdominal aorta under fluoroscopic guidance.  A 5 French sheath placed over the guidewire in the right common femoral artery.  5 French pigtail catheter was then placed over the guidewire in the abdominal aorta.  Abdominal aortogram was obtained in AP projection.  Left and right renal arteries were patent.  Infrarenal abdominal aorta is patent.  Left and right common external and internal iliac arteries are all patent.  Next the pigtail catheter was pulled down distal to the aortic bifurcation and bilateral oblique views of the pelvis were performed.  This confirmed the above findings.  At this point bilateral lower extremity runoff views were obtained through the pigtail catheter.  In the left lower extremity, the left common femoral profunda femoris is patent.  Left superficial femoral artery is patent.  The above-knee popliteal artery is patent but severely diseased with multiple segments of 50 to 80% stenosis.  The below-knee popliteal artery is occluded.  The anterior tibial artery is occluded.  The posterior tibial artery is occluded.  The peroneal artery does reconstitute via  collaterals in the distal third of the leg and fills the foot.  In the right lower extremity, there are similar findings to the left.  At this point to further define the tibial disease in the left leg the 5 French pigtail catheter was removed over guidewire and exchanged for a 5 [place] crossover catheter.  This was used to selectively catheterize the left common iliac artery.  The Bentson wire was then advanced through the crossover catheter down into the distal left common femoral artery.  The 5 [place] crossover catheter was swapped out for a 5 [place] straight catheter and additional lower extremity views were obtained on the left side.  This confirmed the above findings.  At this point the 5 French straight catheter was removed and additional views were obtained of the right lower extremity through the sheath.  This confirmed the above findings as well.  At this point the procedure was concluded.  The patient tolerated procedure well and there were no complications.  Patient was taken to the holding area in stable condition.  Operative management: The patient will be scheduled for a left superficial femoral to peroneal artery bypass with amputation of left first toe next Tuesday, August 20, 2020.  She will have bilateral lower extremity vein mapping performed later today to see if she has reasonable conduit to go to her peroneal artery.  She can resume her heparin at 6 PM today.  Fabienne Bruns, MD [facility] Office: [phone]

## 2020-08-16 NOTE — Progress Notes (Signed)
[facility]                                                                              Patient Demographics  Sarah Bonilla, is a 65 y.o. female, DOB - 11/03/1955, WYO:[id number]  Admit date - 08/10/2020   Admitting Physician Frankey Shown, DO  Outpatient Primary MD for the patient is Patient, No Pcp Per  Outpatient specialists:   LOS - 5  days   Medical records reviewed and are as summarized below:    No chief complaint on file.      Brief summary   Patient is a 65 year old female with history of poorly controlled hypertension due to medication and dietary noncompliance, tobacco abuse with COPD, chronic diastolic CHF and chronic left lower extremity DVT who presented to the [facility] ED with complaints of some leg swelling over the last 1 month which started after she stubbedher left great toe.Patient was admitted with hypertensive crisis and new onset atrial flutter.  Cardiology consulted.She was also noted to have left great toe gangrene with ABI demonstrating moderate PAD. General surgery recommended transfer to [facility] for vascular surgery evaluation. Vascular surgery was consulted, started on vancomycin.  3/18: Underwent aortogram with bilateral lower extremity runoff, showed severe bilateral popliteal and tibial artery occlusive disease one-vessel diseased peroneal runoff bilaterally, pending vein mapping today Per vascular surgery, patient will be scheduled for left superficial femoral to peroneal artery bypass with amputation of left first toe on Tuesday, 3/22  Assessment & Plan    Principal Problem: Left great toe pain secondary to cellulitis/dry gangrene in the setting of moderate PAD -Continue IV vancomycin, Flagyl, Rocephin -Vascular surgery consulted, underwent aortogram with b/l lower extremity runoff, showed severe bilateral popliteal and tibial artery occlusive disease, pending vein mapping today - plan for bypass with left first  toe amputation on Tuesday 3/22 - cont pain control with oxycodone PRN  Active Problems:   Hypertensive emergency -Off the Cardene drip.  Cardiology following. -Continue Cardizem, increase to 360 mg daily, Imdur, hydralazine, lisinopril, Coreg, spironolactone -Renal ultrasound showed 1-59% stenosis of right renal artery, no evidence of left renal artery stenosis  Paroxysmal,  Atrial flutter (HCC), new onset -Rate controlled, continue Cardizem, Coreg -Resume heparin drip at 6 PM today  Normocytic anemia, possibly anemia of chronic disease - baseline hemoglobin ~8 -FOBT negative, H&H improving, continue IV heparin drip   Hypokalemia Resolved  Chronic left lower extremity DVT -Currently on heparin drip while vascular interventions are planned -Need to be more compliant with oral anticoagulation, start NOAC after interventions are completed  COPD with nicotine abuse -Currently no wheezing, stable, continue as needed bronchodilators -Counseled on nicotine cessation   Code Status: Full CODE STATUS DVT Prophylaxis:     Level of Care: Level of care: Telemetry Cardiac Family Communication: Discussed all imaging results, lab results, explained to the patient    Disposition Plan:     Status is: Inpatient  Remains inpatient appropriate because:Inpatient level of care appropriate due to severity of illness   Dispo: The patient is from: Home              Anticipated  d/c is to: Home              Patient currently is not medically stable to d/c.  Pending vascular interventions for left toe gangrene   Difficult to place patient No      Time Spent in minutes 25 minutes  Procedures:  2D echo 3/18 aortogram with bilateral lower extremity runoff, showed severe bilateral popliteal and tibial artery occlusive disease one-vessel diseased peroneal runoff bilaterally  Consultants:   Cardiology General surgery Vascular surgery  Antimicrobials:   Anti-infectives (From admission,  onward)   Start     Dose/Rate Route Frequency Ordered Stop   08/13/20 1400  metroNIDAZOLE (FLAGYL) tablet 500 mg        500 mg Oral Every 8 hours 08/13/20 1026     08/13/20 1115  cefTRIAXone (ROCEPHIN) 1 g in sodium chloride 0.9 % 100 mL IVPB        1 g 200 mL/hr over 30 Minutes Intravenous Every 24 hours 08/13/20 1026     08/11/20 2200  vancomycin (VANCOREADY) IVPB 750 mg/150 mL        750 mg 150 mL/hr over 60 Minutes Intravenous Every 24 hours 08/11/20 0019     08/11/20 0015  vancomycin (VANCOCIN) IVPB 1000 mg/200 mL premix        1,000 mg 200 mL/hr over 60 Minutes Intravenous  Once 08/11/20 0012 08/11/20 0306   08/10/20 2345  cefTRIAXone (ROCEPHIN) 1 g in sodium chloride 0.9 % 100 mL IVPB        1 g 200 mL/hr over 30 Minutes Intravenous  Once 08/10/20 2340 08/11/20 0100         Medications  Scheduled Meds: . carvedilol  25 mg Oral BID WC  . diltiazem  360 mg Oral Daily  . hydrALAZINE  100 mg Oral Q8H  . isosorbide mononitrate  60 mg Oral Daily  . lisinopril  40 mg Oral Daily  . metroNIDAZOLE  500 mg Oral Q8H  . mupirocin ointment  1 application Nasal BID  . pantoprazole  40 mg Oral Daily  . sodium chloride flush  3 mL Intravenous Q12H  . sodium chloride flush  3 mL Intravenous Q12H  . spironolactone  25 mg Oral Daily   Continuous Infusions: . sodium chloride Stopped (08/11/20 2249)  . sodium chloride    . sodium chloride    . cefTRIAXone (ROCEPHIN)  IV 1 g (08/15/20 1200)  . heparin    . vancomycin 750 mg (08/15/20 2221)   PRN Meds:.sodium chloride, sodium chloride, acetaminophen **OR** acetaminophen, hydrALAZINE, HYDROmorphone (DILAUDID) injection, labetalol, levalbuterol, morphine injection, ondansetron **OR** ondansetron (ZOFRAN) IV, oxyCODONE, sodium chloride flush, sodium chloride flush      Subjective:   Sarah Bonilla was seen and examined today.  BP now controlled.  No complaints.  Pain is controlled with current regimen.  No fevers or chills.   No  acute events overnight.  Objective:   Vitals:   08/16/20 1145 08/16/20 1150 08/16/20 1155 08/16/20 1228  BP: 138/64 132/61 135/63 132/69  Pulse: 71 71 72 73  Resp: 19 (!) 24 (!) 23 16  Temp:    (!) 97.5 F (36.4 C)  TempSrc:    Oral  SpO2: 91% 91% 92% 91%  Weight:      Height:        Intake/Output Summary (Last 24 hours) at 08/16/2020 1329 Last data filed at 08/16/2020 0815 Gross per 24 hour  Intake 299.27 ml  Output 0 ml  Net 299.27 ml  Wt Readings from Last 3 Encounters:  08/16/20 45.4 kg  04/23/20 40 kg  12/20/17 48.5 kg   Physical Exam  General: Alert and oriented x 3, NAD  Cardiovascular: S1 S2 clear, RRR. No pedal edema b/l  Respiratory: CTAB  Gastrointestinal: Soft, nontender, nondistended, NBS  Ext: no pedal edema bilaterally  Neuro: no new deficits  Musculoskeletal: No cyanosis, clubbing  Skin: Left great toe gangrene  Psych: Normal affect and demeanor, alert and oriented x3           Data Reviewed:  I have personally reviewed following labs and imaging studies  Micro Results Recent Results (from the past 240 hour(s))  Blood culture (routine x 2)     Status: None   Collection Time: 08/10/20 11:53 PM   Specimen: BLOOD RIGHT FOREARM  Result Value Ref Range Status   Specimen Description BLOOD RIGHT FOREARM  Final   Special Requests   Final    BOTTLES DRAWN AEROBIC AND ANAEROBIC Blood Culture adequate volume   Culture   Final    NO GROWTH 5 DAYS Performed at [facility], [address] [place], [place] [address]    Report Status 08/16/2020 FINAL  Final  Blood culture (routine x 2)     Status: None   Collection Time: 08/11/20 12:00 AM   Specimen: BLOOD RIGHT ARM  Result Value Ref Range Status   Specimen Description BLOOD RIGHT ARM  Final   Special Requests   Final    BOTTLES DRAWN AEROBIC AND ANAEROBIC Blood Culture results may not be optimal due to an excessive volume of blood received in culture bottles   Culture   Final    NO  GROWTH 5 DAYS Performed at [facility], [address] [place], [place] [address]    Report Status 08/16/2020 FINAL  Final  Resp Panel by RT-PCR (Flu A&B, Covid) Nasopharyngeal Swab     Status: None   Collection Time: 08/11/20  1:07 AM   Specimen: Nasopharyngeal Swab; Nasopharyngeal(NP) swabs in vial transport medium  Result Value Ref Range Status   SARS Coronavirus 2 by RT PCR NEGATIVE NEGATIVE Final    Comment: (NOTE) SARS-CoV-2 target nucleic acids are NOT DETECTED.  The SARS-CoV-2 RNA is generally detectable in upper respiratory specimens during the acute phase of infection. The lowest concentration of SARS-CoV-2 viral copies this assay can detect is 138 copies/mL. A negative result does not preclude SARS-Cov-2 infection and should not be used as the sole basis for treatment or other patient management decisions. A negative result may occur with  improper specimen collection/handling, submission of specimen other than nasopharyngeal swab, presence of viral mutation(s) within the areas targeted by this assay, and inadequate number of viral copies(<138 copies/mL). A negative result must be combined with clinical observations, patient history, and epidemiological information. The expected result is Negative.  Fact Sheet for Patients:  BloggerCourse.comhttps://www.fda.gov/media/152166/download  Fact Sheet for Healthcare Providers:  SeriousBroker.ithttps://www.fda.gov/media/152162/download  This test is no t yet approved or cleared by the [place] FDA and  has been authorized for detection and/or diagnosis of SARS-CoV-2 by FDA under an Emergency Use Authorization (EUA). This EUA will remain  in effect (meaning this test can be used) for the duration of the COVID-19 declaration under Section 564(b)(1) of the Act, 21 U.S.C.section 360bbb-3(b)(1), unless the authorization is terminated  or revoked sooner.       Influenza A by PCR NEGATIVE NEGATIVE Final   Influenza B by PCR NEGATIVE NEGATIVE Final     Comment: (NOTE)  The Xpert Xpress SARS-CoV-2/FLU/RSV plus assay is intended as an aid in the diagnosis of influenza from Nasopharyngeal swab specimens and should not be used as a sole basis for treatment. Nasal washings and aspirates are unacceptable for Xpert Xpress SARS-CoV-2/FLU/RSV testing.  Fact Sheet for Patients: BloggerCourse.com  Fact Sheet for Healthcare Providers: SeriousBroker.it  This test is not yet approved or cleared by the [place] FDA and has been authorized for detection and/or diagnosis of SARS-CoV-2 by FDA under an Emergency Use Authorization (EUA). This EUA will remain in effect (meaning this test can be used) for the duration of the COVID-19 declaration under Section 564(b)(1) of the Act, 21 U.S.C. section 360bbb-3(b)(1), unless the authorization is terminated or revoked.  Performed at [facility], [address] [place], [place] [address]   MRSA PCR Screening     Status: None   Collection Time: 08/11/20  9:12 AM   Specimen: Nasal Mucosa; Nasopharyngeal  Result Value Ref Range Status   MRSA by PCR NEGATIVE NEGATIVE Final    Comment:        The GeneXpert MRSA Assay (FDA approved for NASAL specimens only), is one component of a comprehensive MRSA colonization surveillance program. It is not intended to diagnose MRSA infection nor to guide or monitor treatment for MRSA infections. Performed at [facility], [address], [place], [place] [address]   Surgical PCR screen     Status: None   Collection Time: 08/16/20  7:43 AM   Specimen: Nasal Mucosa; Nasal Swab  Result Value Ref Range Status   MRSA, PCR NEGATIVE NEGATIVE Final   Staphylococcus aureus NEGATIVE NEGATIVE Final    Comment: (NOTE) The Xpert SA Assay (FDA approved for NASAL specimens in patients 70 years of age and older), is one component of a comprehensive surveillance program. It is not intended to diagnose infection nor to guide or  monitor treatment. Performed at [facility], [address] [place], [place] [address]     Radiology Reports CT Angio Chest PE W and/or Wo Contrast  Result Date: 08/11/2020 CLINICAL DATA:  Pulmonary embolus suspected. Swelling left lower extremity. Injured for months. EXAM: CT ANGIOGRAPHY CHEST WITH CONTRAST TECHNIQUE: Multidetector CT imaging of the chest was performed using the standard protocol during bolus administration of intravenous contrast. Multiplanar CT image reconstructions and MIPs were obtained to evaluate the vascular anatomy. CONTRAST:  OMNIPAQUE IOHEXOL 350 MG/ML SOLN COMPARISON:  Chest x-ray 08/10/2020 FINDINGS: Cardiovascular: Satisfactory opacification of the pulmonary arteries to the segmental level. No evidence of pulmonary embolism. The main pulmonary artery is normal in caliber. Mild atherosclerotic plaque of the thoracic aorta. severe left anterior descending left circumflex as well as at least mild right main coronary artery calcifications. Mediastinum/Nodes: No enlarged mediastinal, hilar, or axillary lymph nodes. Thyroid gland, trachea, and esophagus demonstrate no significant findings. Lungs/Pleura: Centrilobular emphysematous changes. Bilateral lower lobe subsegmental atelectasis. Limited evaluation for pulmonary nodularity due to motion artifact. No pulmonary mass. No focal consolidation. No pleural effusion. No pneumothorax. Upper Abdomen: Status post cholecystectomy. Atherosclerotic plaque. No acute abnormality. Musculoskeletal: No chest wall abnormality. Likely bone island of the left posterior eighth rib. No suspicious lytic or blastic osseous lesions. No acute displaced fracture. Review of the MIP images confirms the above findings. IMPRESSION: 1. No central or segmental pulmonary embolus. Limited evaluation at the subsegmental level due to motion artifact. 2. No acute intrapulmonary abnormality. 3. Aortic Atherosclerosis (ICD10-I70.0) including 3 vessel  mild to severe coronary artery calcification. 4.  Emphysema (ICD10-J43.9). Electronically  Signed   By: Tish Frederickson M.D.   On: 08/11/2020 04:06   PERIPHERAL VASCULAR CATHETERIZATION  Result Date: 08/16/2020 Procedure: Abdominal aortogram with bilateral lower extremity runoff, second order catheterization left external iliac artery Preoperative diagnosis: Gangrene right first toe Postoperative diagnosis: Same Anesthesia: Local Operative findings: 1.  Severe bilateral popliteal and tibial artery occlusive disease one-vessel diseased peroneal runoff bilaterally Operative details: After pain informed consent, patient was removed with PV lab.  The patient was placed in supine position angio table.  Both groins were prepped and draped in usual sterile fashion.  Local anesthesia was notewriter of the right common femoral artery.  Ultrasound was used to identify the right common femoral artery and femoral bifurcation.  An introducer needle was used to cannulate the right common femoral artery and an 035 Bentson wire advanced up the abdominal aorta under fluoroscopic guidance.  A 5 French sheath placed over the guidewire in the right common femoral artery.  5 French pigtail catheter was then placed over the guidewire in the abdominal aorta.  Abdominal aortogram was obtained in AP projection.  Left and right renal arteries were patent.  Infrarenal abdominal aorta is patent.  Left and right common external and internal iliac arteries are all patent.  Next the pigtail catheter was pulled down distal to the aortic bifurcation and bilateral oblique views of the pelvis were performed.  This confirmed the above findings.  At this point bilateral lower extremity runoff views were obtained through the pigtail catheter. In the left lower extremity, the left common femoral profunda femoris is patent.  Left superficial femoral artery is patent.  The above-knee popliteal artery is patent but severely diseased with multiple  segments of 50 to 80% stenosis.  The below-knee popliteal artery is occluded.  The anterior tibial artery is occluded.  The posterior tibial artery is occluded.  The peroneal artery does reconstitute via collaterals in the distal third of the leg and fills the foot. In the right lower extremity, there are similar findings to the left. At this point to further define the tibial disease in the left leg the 5 French pigtail catheter was removed over guidewire and exchanged for a 5 [place] crossover catheter.  This was used to selectively catheterize the left common iliac artery.  The Bentson wire was then advanced through the crossover catheter down into the distal left common femoral artery.  The 5 [place] crossover catheter was swapped out for a 5 [place] straight catheter and additional lower extremity views were obtained on the left side.  This confirmed the above findings. At this point the 5 French straight catheter was removed and additional views were obtained of the right lower extremity through the sheath.  This confirmed the above findings as well. At this point the procedure was concluded.  The patient tolerated procedure well and there were no complications.  Patient was taken to the holding area in stable condition. Operative management: The patient will be scheduled for a left superficial femoral to peroneal artery bypass with amputation of left first toe next Tuesday, August 20, 2020.  She will have bilateral lower extremity vein mapping performed later today to see if she has reasonable conduit to go to her peroneal artery.  She can resume her heparin at 6 PM today. Fabienne Bruns, MD [facility] Office: [phone]  US ARTERIAL ABI (SCREENING LOWER EXTREMITY)  Result Date: 08/12/2020 CLINICAL DATA:  Left first toe gangrene.  History of hypertension. EXAM: NONINVASIVE PHYSIOLOGIC VASCULAR  STUDY OF BILATERAL LOWER EXTREMITIES TECHNIQUE: Evaluation of both lower  extremities were performed at rest, including calculation of ankle-brachial indices with single level Doppler, pressure and pulse volume recording. COMPARISON:  None. FINDINGS: Right ABI:  0.65 Left ABI:  0.51 Right Lower Extremity: Monophasic posterior tibial and dorsalis pedis waveforms. Left Lower Extremity: Monophasic posterior tibial and dorsalis pedis waveforms. 0.5-0.79 Moderate PAD IMPRESSION: Evidence of moderate peripheral vascular disease in both lower extremities with resting ankle-brachial indices of 0.65 on the right and 0.51 on the left. Distal waveforms are monophasic bilaterally. Consider further evaluation with CT angiography of the abdominal aorta with bilateral iliofemoral runoff for further characterization of arterial occlusive disease. Electronically Signed   By: Irish Lack M.D.   On: 08/12/2020 11:05   DG Chest Portable 1 View  Result Date: 08/10/2020 CLINICAL DATA:  Hypoxia EXAM: PORTABLE CHEST 1 VIEW COMPARISON:  04/19/2020 FINDINGS: The lungs are hyperinflated with diffuse interstitial prominence. No focal airspace consolidation or pulmonary edema. No pleural effusion or pneumothorax. Normal cardiomediastinal contours. IMPRESSION: COPD without acute airspace disease. Electronically Signed   By: Deatra Robinson M.D.   On: 08/10/2020 23:58   DG Foot Complete Left  Result Date: 08/10/2020 CLINICAL DATA:  Left great toe swelling. EXAM: LEFT FOOT - COMPLETE 3+ VIEW COMPARISON:  None. FINDINGS: There is no evidence of fracture or dislocation. There is diffuse osteopenia with mild degenerative changes seen along the dorsal aspect of the proximal to mid left foot. Marked severity soft tissue swelling is seen throughout the left great toe. IMPRESSION: Marked severity soft tissue swelling throughout the left great toe without evidence of an acute osseous abnormality. Electronically Signed   By: Aram Candela M.D.   On: 08/10/2020 22:10   ECHOCARDIOGRAM COMPLETE  Result Date:  08/12/2020    ECHOCARDIOGRAM REPORT   Patient Name:   Sarah Bonilla Date of Exam: 08/12/2020 Medical Rec #:  [id number]        Height:       62.0 in Accession #:    [id number]       Weight:       98.5 lb Date of Birth:  December 12, 1955        BSA:          1.415 m Patient Age:    65 years         BP:           171/85 mmHg Patient Gender: F                HR:           88 bpm. [facility] Procedure: 2D Echo, Cardiac Doppler and Color Doppler Indications:    Abnormal ECG R94.31  History:        Patient has prior history of Echocardiogram examinations, most                 recent 04/19/2020. CHF, Previous Myocardial Infarction, COPD;                 Risk Factors:Hypertension. Tobacco abuse.  Sonographer:    Celesta Gentile RCS Referring Phys: [id number] PRATIK D [facility] IMPRESSIONS  1. Hypokinesis of the distal anteroseptal distal anterolateral wall; akinesis of the apex. . Left ventricular ejection fraction, by estimation, is 55 to 60%. The left ventricle has normal function. Left ventricular diastolic parameters are consistent with Grade II diastolic dysfunction (pseudonormalization).  2. Right ventricular systolic function is normal. The right ventricular size is  normal. Mildly increased right ventricular wall thickness. There is moderately elevated pulmonary artery systolic pressure.  3. Left atrial size was severely dilated.  4. MR is predominantly cnetral, though some is directed posterior into LA. . Moderate to severe mitral valve regurgitation.  5. Tricuspid valve regurgitation is moderate.  6. The aortic valve is tricuspid. Aortic valve regurgitation is mild. Mild aortic valve sclerosis is present, with no evidence of aortic valve stenosis.  7. The inferior vena cava is dilated in size with <50% respiratory variability, suggesting right atrial pressure of 15 mmHg. FINDINGS  Left Ventricle: Hypokinesis of the distal anteroseptal distal anterolateral wall; akinesis of the apex. Left ventricular ejection  fraction, by estimation, is 55 to 60%. The left ventricle has normal function. The left ventricular internal cavity size was normal in size. There is no left ventricular hypertrophy. Left ventricular diastolic parameters are consistent with Grade II diastolic dysfunction (pseudonormalization). Right Ventricle: The right ventricular size is normal. Mildly increased right ventricular wall thickness. Right ventricular systolic function is normal. There is moderately elevated pulmonary artery systolic pressure. The tricuspid regurgitant velocity is 3.27 m/s, and with an assumed right atrial pressure of 8 mmHg, the estimated right ventricular systolic pressure is 50.8 mmHg. Left Atrium: Left atrial size was severely dilated. Right Atrium: Right atrial size was normal in size. Pericardium: There is no evidence of pericardial effusion. Mitral Valve: MR is predominantly cnetral, though some is directed posterior into LA. There is mild thickening of the mitral valve leaflet(s). Moderate to severe mitral valve regurgitation. Tricuspid Valve: The tricuspid valve is normal in structure. Tricuspid valve regurgitation is moderate. Aortic Valve: The aortic valve is tricuspid. Aortic valve regurgitation is mild. Mild aortic valve sclerosis is present, with no evidence of aortic valve stenosis. Pulmonic Valve: The pulmonic valve was not well visualized. Aorta: The aortic root is normal in size and structure. Venous: The inferior vena cava is dilated in size with less than 50% respiratory variability, suggesting right atrial pressure of 15 mmHg.  LEFT VENTRICLE PLAX 2D LVIDd:         4.00 cm  Diastology LVIDs:         2.60 cm  LV e' medial:    5.98 cm/s LV PW:         1.10 cm  LV E/e' medial:  20.7 LV IVS:        1.10 cm  LV e' lateral:   8.49 cm/s LVOT diam:     1.90 cm  LV E/e' lateral: 14.6 LV SV:         63 LV SV Index:   44 LVOT Area:     2.84 cm  RIGHT VENTRICLE RV S prime:     11.70 cm/s TAPSE (M-mode): 2.0 cm LEFT ATRIUM              Index       RIGHT ATRIUM           Index LA diam:        3.10 cm 2.19 cm/m  RA Area:     15.70 cm LA Vol (A2C):   61.4 ml 43.40 ml/m RA Volume:   40.00 ml  28.27 ml/m LA Vol (A4C):   66.5 ml 47.00 ml/m LA Biplane Vol: 64.1 ml 45.31 ml/m  AORTIC VALVE LVOT Vmax:   117.00 cm/s LVOT Vmean:  78.300 cm/s LVOT VTI:    0.222 m  AORTA Ao Root diam: 2.70 cm MITRAL VALVE  TRICUSPID VALVE MV Area (PHT): 3.46 cm      TR Peak grad:   42.8 mmHg MV Decel Time: 219 msec      TR Vmax:        327.00 cm/s MR Peak grad:    136.4 mmHg MR Mean grad:    86.0 mmHg   SHUNTS MR Vmax:         584.00 cm/s Systemic VTI:  0.22 m MR Vmean:        437.0 cm/s  Systemic Diam: 1.90 cm MR PISA:         2.26 cm MR PISA Eff ROA: 9 mm MR PISA Radius:  0.60 cm MV E velocity: 124.00 cm/s MV A velocity: 81.00 cm/s MV E/A ratio:  1.53 Dietrich Pates MD Electronically signed by Dietrich Pates MD Signature Date/Time: 08/12/2020/7:26:41 PM    Final    VAS US RENAL ARTERY DUPLEX  Result Date: 08/15/2020 ABDOMINAL VISCERAL Indications: Hypertensive emergency. High Risk Factors: Hypertension, coronary artery disease. Performing Technologist: Marilynne Halsted RDMS, RVT  Examination Guidelines: A complete evaluation includes B-mode imaging, spectral Doppler, color Doppler, and power Doppler as needed of all accessible portions of each vessel. Bilateral testing is considered an integral part of a complete examination. Limited examinations for reoccurring indications may be performed as noted.  Duplex Findings: +--------------------+--------+--------+------+--------+ Mesenteric          PSV cm/sEDV cm/sPlaqueComments +--------------------+--------+--------+------+--------+ Aorta Prox             83                          +--------------------+--------+--------+------+--------+ Celiac Artery Origin  192                          +--------------------+--------+--------+------+--------+ SMA Proximal          178                           +--------------------+--------+--------+------+--------+    +------------------+--------+--------+-------+ Right Renal ArteryPSV cm/sEDV cm/sComment +------------------+--------+--------+-------+ Origin              205      35           +------------------+--------+--------+-------+ Proximal            116      17           +------------------+--------+--------+-------+ Mid                  96      17           +------------------+--------+--------+-------+ Distal               74      17           +------------------+--------+--------+-------+ +-----------------+--------+--------+-------+ Left Renal ArteryPSV cm/sEDV cm/sComment +-----------------+--------+--------+-------+ Origin             111      25           +-----------------+--------+--------+-------+ Proximal           112      28           +-----------------+--------+--------+-------+ Mid                114      29           +-----------------+--------+--------+-------+ Distal  118      19           +-----------------+--------+--------+-------+ +------------+--------+--------+----+-----------+--------+--------+----+ Right KidneyPSV cm/sEDV cm/sRI  Left KidneyPSV cm/sEDV cm/sRI   +------------+--------+--------+----+-----------+--------+--------+----+ Upper Pole  24      8       0.71Upper Pole 32      9       0.71 +------------+--------+--------+----+-----------+--------+--------+----+ Mid         31      9       0.        23      7       0.69 +------------+--------+--------+----+-----------+--------+--------+----+ Lower Pole  20      6       0.72Lower Pole 32      9       0.74 +------------+--------+--------+----+-----------+--------+--------+----+ Hilar       30      9       0.70Hilar      34      10      0.70 +------------+--------+--------+----+-----------+--------+--------+----+  +------------------+-----+------------------+-----+ Right Kidney           Left Kidney             +------------------+-----+------------------+-----+ RAR                    RAR                     +------------------+-----+------------------+-----+ RAR (manual)      2.4  RAR (manual)      1.3   +------------------+-----+------------------+-----+ Cortex                 Cortex                  +------------------+-----+------------------+-----+ Cortex thickness       Corex thickness         +------------------+-----+------------------+-----+ Kidney length (cm)11.66Kidney length (cm)10.94 +------------------+-----+------------------+-----+  Summary: Renal:  Right: 1-59% stenosis of the right renal artery. Abnormal right        Resistive Index. Normal size right kidney. Left:  No evidence of left renal artery stenosis. Abnormal left        Resisitve Index. Normal size of left kidney.  *See table(s) above for measurements and observations.  Diagnosing physician: Lemar Livings MD  Electronically signed by Lemar Livings MD on 08/15/2020 at 6:01:20 PM.    Final     Lab Data:  CBC: Recent Labs  Lab 08/12/20 0802 08/13/20 0219 08/14/20 0343 08/15/20 0219 08/16/20 0251  WBC 12.0* 10.5 11.3* 11.3* 9.7  HGB 9.3* 9.3* 8.7* 8.0* 8.4*  HCT 29.5* 29.0* 27.2* 25.5* 26.1*  MCV 93.7 92.4 91.9 93.8 92.2  PLT 176 170 169 167 172   Basic Metabolic Panel: Recent Labs  Lab 08/10/20 2352 08/11/20 0552 08/12/20 0802 08/13/20 0219 08/14/20 0343 08/16/20 0251  NA  --  142 144 143 141 140  K  --  2.5* 3.2* 3.4* 3.9 3.6  CL  --  101 109 108 109 109  CO2  --  29 23 25 23 22   GLUCOSE  --  102* 110* 115* 103* 101*  BUN  --  18 18 19 17 14   CREATININE  --  0.51 0.47 0.61 0.76 0.80  CALCIUM  --  9.0 9.2 9.4 9.2 9.1  MG 1.7  --  1.7 1.7 2.1 1.9   GFR: Estimated Creatinine Clearance: 50.9 mL/min (by C-G formula based on SCr of 0.8  mg/dL). Liver Function Tests: No results for input(s):  AST, ALT, ALKPHOS, BILITOT, PROT, ALBUMIN in the last 168 hours. No results for input(s): LIPASE, AMYLASE in the last 168 hours. No results for input(s): AMMONIA in the last 168 hours. Coagulation Profile: No results for input(s): INR, PROTIME in the last 168 hours. Cardiac Enzymes: No results for input(s): CKTOTAL, CKMB, CKMBINDEX, TROPONINI in the last 168 hours. BNP (last 3 results) No results for input(s): PROBNP in the last 8760 hours. HbA1C: No results for input(s): HGBA1C in the last 72 hours. CBG: No results for input(s): GLUCAP in the last 168 hours. Lipid Profile: No results for input(s): CHOL, HDL, LDLCALC, TRIG, CHOLHDL, LDLDIRECT in the last 72 hours. Thyroid Function Tests: No results for input(s): TSH, T4TOTAL, FREET4, T3FREE, THYROIDAB in the last 72 hours. Anemia Panel: No results for input(s): VITAMINB12, FOLATE, FERRITIN, TIBC, IRON, RETICCTPCT in the last 72 hours. Urine analysis: No results found for: COLORURINE, APPEARANCEUR, LABSPEC, PHURINE, GLUCOSEU, HGBUR, BILIRUBINUR, KETONESUR, PROTEINUR, UROBILINOGEN, NITRITE, LEUKOCYTESUR   Sarah Bonilla M.D. [facility] 08/16/2020, 1:29 PM  Available via Epic secure chat 7am-7pm After 7 pm, please refer to night coverage provider listed on amion.

## 2020-08-16 NOTE — Progress Notes (Signed)
Pharmacy Antibiotic Note  Sarah Bonilla is a 65 y.o. female admitted  with cellulitis and dry gangrene and plans for bypass and toe amputation next week.  Pharmacy has been consulted for vancomycin dosing (also noted on ceftriaxone and flagyl) -SCr= 0.8 -WBC= 9.7 -cultures- ngtd  Plan: -Continue vancomycin 750mg  IV q24h -Will follow renal function, cultures and clinical progress -Check vancomycin levels on 3/19   Height: 5\' 2"  (157.5 cm) Weight: 45.4 kg (100 lb 1.6 oz) IBW/kg (Calculated) : 50.1  Temp (24hrs), Avg:97.5 F (36.4 C), Min:97.4 F (36.3 C), Max:97.5 F (36.4 C)  Recent Labs  Lab 08/10/20 2208 08/10/20 2352 08/11/20 0552 08/12/20 0802 08/13/20 0219 08/14/20 0343 08/15/20 0219 08/16/20 0251  WBC  --   --   --  12.0* 10.5 11.3* 11.3* 9.7  CREATININE  --   --  0.51 0.47 0.61 0.76  --  0.80  LATICACIDVEN 1.1 1.5  --   --   --   --   --   --     Estimated Creatinine Clearance: 50.9 mL/min (by C-G formula based on SCr of 0.8 mg/dL).    No Known Allergies    Thank you for allowing pharmacy to be a part of this patient's care.  08/17/20, PharmD Clinical Pharmacist **Pharmacist phone directory can now be found on [email or web address] ([email or web address]).  Listed under [facility].

## 2020-08-16 NOTE — Progress Notes (Signed)
Bilateral lower extremity saphenous vein mapping has been completed. Preliminary results can be found in CV Proc through chart review.   08/16/20 3:23 PM Olen Cordial RVT

## 2020-08-16 NOTE — Progress Notes (Addendum)
Progress Note  Patient Name: Sarah Bonilla Date of Encounter: 08/16/2020  Primary Cardiologist: Dina Rich, MD   Subjective   Feeling well today. Anticipate procedure today. No specific complaints today   Inpatient Medications    Scheduled Meds: . carvedilol  25 mg Oral BID WC  . diltiazem  360 mg Oral Daily  . hydrALAZINE  100 mg Oral Q8H  . isosorbide mononitrate  60 mg Oral Daily  . lisinopril  40 mg Oral Daily  . metroNIDAZOLE  500 mg Oral Q8H  . mupirocin ointment  1 application Nasal BID  . pantoprazole  40 mg Oral Daily  . sodium chloride flush  3 mL Intravenous Q12H  . spironolactone  25 mg Oral Daily   Continuous Infusions: . sodium chloride Stopped (08/11/20 2249)  . sodium chloride 100 mL/hr at 08/16/20 0308  . cefTRIAXone (ROCEPHIN)  IV 1 g (08/15/20 1200)  . heparin 1,400 Units/hr (08/16/20 0865)  . vancomycin 750 mg (08/15/20 2221)   PRN Meds: sodium chloride, acetaminophen **OR** acetaminophen, HYDROmorphone (DILAUDID) injection, labetalol, levalbuterol, ondansetron **OR** ondansetron (ZOFRAN) IV, oxyCODONE, sodium chloride flush   Vital Signs    Vitals:   08/15/20 1014 08/15/20 1411 08/15/20 2040 08/16/20 0336  BP: (!) 137/103 123/68 124/60 (!) 159/72  Pulse: 83 71 72 70  Resp:    16  Temp:  (!) 97.3 F (36.3 C) (!) 97.4 F (36.3 C) (!) 97.5 F (36.4 C)  TempSrc:  Oral Oral Oral  SpO2: 93% 94% 93% 93%  Weight:    45.4 kg  Height:    5\' 2"  (1.575 m)    Intake/Output Summary (Last 24 hours) at 08/16/2020 0752 Last data filed at 08/15/2020 1606 Gross per 24 hour  Intake 375.39 ml  Output 300 ml  Net 75.39 ml   Filed Weights   08/14/20 0459 08/15/20 0558 08/16/20 0336  Weight: 43.1 kg 45 kg 45.4 kg    Physical Exam   General: Frail, NAD Neck: Negative for carotid bruits. No JVD Lungs:Clear to ausculation bilaterally. No wheezes, rales, or rhonchi. Breathing is unlabored. Cardiovascular: RRR with S1 S2. No murmurs Abdomen:  Soft, non-tender, non-distended. No obvious abdominal masses. Extremities: No edema. Neuro: Alert and oriented. No focal deficits. No facial asymmetry. MAE spontaneously. Psych: Responds to questions appropriately with normal affect.    Labs    Chemistry Recent Labs  Lab 08/13/20 0219 08/14/20 0343 08/16/20 0251  NA 143 141 140  K 3.4* 3.9 3.6  CL 108 109 109  CO2 25 23 22   GLUCOSE 115* 103* 101*  BUN 19 17 14   CREATININE 0.61 0.76 0.80  CALCIUM 9.4 9.2 9.1  GFRNONAA >60 >60 >60  ANIONGAP 10 9 9      Hematology Recent Labs  Lab 08/14/20 0343 08/15/20 0219 08/16/20 0251  WBC 11.3* 11.3* 9.7  RBC 2.96* 2.72* 2.83*  HGB 8.7* 8.0* 8.4*  HCT 27.2* 25.5* 26.1*  MCV 91.9 93.8 92.2  MCH 29.4 29.4 29.7  MCHC 32.0 31.4 32.2  RDW 16.3* 16.1* 15.9*  PLT 169 167 172    Cardiac EnzymesNo results for input(s): TROPONINI in the last 168 hours. No results for input(s): TROPIPOC in the last 168 hours.   BNP Recent Labs  Lab 08/10/20 2352  BNP 2,259.0*     DDimer  Recent Labs  Lab 08/10/20 2352  DDIMER 0.70*     Radiology    VAS 08/17/20 RENAL ARTERY DUPLEX  Result Date: 08/15/2020 ABDOMINAL VISCERAL Indications: Hypertensive  emergency. High Risk Factors: Hypertension, coronary artery disease. Performing Technologist: Marilynne Halstedita Sturdivant RDMS, RVT  Examination Guidelines: A complete evaluation includes B-mode imaging, spectral Doppler, color Doppler, and power Doppler as needed of all accessible portions of each vessel. Bilateral testing is considered an integral part of a complete examination. Limited examinations for reoccurring indications may be performed as noted.  Duplex Findings: +--------------------+--------+--------+------+--------+ Mesenteric          PSV cm/sEDV cm/sPlaqueComments +--------------------+--------+--------+------+--------+ Aorta Prox             83                          +--------------------+--------+--------+------+--------+ Celiac Artery  Origin  192                          +--------------------+--------+--------+------+--------+ SMA Proximal          178                          +--------------------+--------+--------+------+--------+    +------------------+--------+--------+-------+ Right Renal ArteryPSV cm/sEDV cm/sComment +------------------+--------+--------+-------+ Origin              205      35           +------------------+--------+--------+-------+ Proximal            116      17           +------------------+--------+--------+-------+ Mid                  96      17           +------------------+--------+--------+-------+ Distal               74      17           +------------------+--------+--------+-------+ +-----------------+--------+--------+-------+ Left Renal ArteryPSV cm/sEDV cm/sComment +-----------------+--------+--------+-------+ Origin             111      25           +-----------------+--------+--------+-------+ Proximal           112      28           +-----------------+--------+--------+-------+ Mid                114      29           +-----------------+--------+--------+-------+ Distal             118      19           +-----------------+--------+--------+-------+ +------------+--------+--------+----+-----------+--------+--------+----+ Right KidneyPSV cm/sEDV cm/sRI  Left KidneyPSV cm/sEDV cm/sRI   +------------+--------+--------+----+-----------+--------+--------+----+ Upper Pole  24      8       0.71Upper Pole 32      9       0.71 +------------+--------+--------+----+-----------+--------+--------+----+ Mid         31      9       0.69Mid        23      7       0.69 +------------+--------+--------+----+-----------+--------+--------+----+ Lower Pole  20      6       0.72Lower Pole 32      9       0.74 +------------+--------+--------+----+-----------+--------+--------+----+ Hilar       30      9  0.70Hilar      34       10      0.70 +------------+--------+--------+----+-----------+--------+--------+----+ +------------------+-----+------------------+-----+ Right Kidney           Left Kidney             +------------------+-----+------------------+-----+ RAR                    RAR                     +------------------+-----+------------------+-----+ RAR (manual)      2.4  RAR (manual)      1.3   +------------------+-----+------------------+-----+ Cortex                 Cortex                  +------------------+-----+------------------+-----+ Cortex thickness       Corex thickness         +------------------+-----+------------------+-----+ Kidney length (cm)11.66Kidney length (cm)10.94 +------------------+-----+------------------+-----+  Summary: Renal:  Right: 1-59% stenosis of the right renal artery. Abnormal right        Resistive Index. Normal size right kidney. Left:  No evidence of left renal artery stenosis. Abnormal left        Resisitve Index. Normal size of left kidney.  *See table(s) above for measurements and observations.  Diagnosing physician: Lemar Livings MD  Electronically signed by Lemar Livings MD on 08/15/2020 at 6:01:20 PM.    Final    Telemetry    08/16/20 Maintaining NSR with frequent PVC/ventricular bigeminy - Personally Reviewed  ECG    No new tracing as of 08/16/20- Personally Reviewed  Cardiac Studies   US ARTERIAL ABI (SCREENING LOWER EXTREMITY)  Result Date: 08/12/2020 CLINICAL DATA: Left first toe gangrene. History of hypertension.  Evidence of moderate peripheral vascular disease in both lower extremities with resting ankle-brachial indices of 0.65 on the right and 0.51 on the left. Distal waveforms are monophasic bilaterally. Consider further evaluation with CT angiography of the abdominal aorta with bilateral iliofemoral runoff for further characterization of arterial occlusive disease.  ECHOCARDIOGRAM COMPLETE Result Date: 08/12/2020  1.  Hypokinesis of the distal anteroseptal distal anterolateral wall;  akinesis of the apex. . Left ventricular ejection fraction, by estimation,  is 55 to 60%. The left ventricle has normal function. Left ventricular  diastolic parameters are consistent  with Grade II diastolic dysfunction (pseudonormalization).  2. Right ventricular systolic function is normal. The right ventricular  size is normal. Mildly increased right ventricular wall thickness. There  is moderately elevated pulmonary artery systolic pressure.  3. Left atrial size was severely dilated.  4. MR is predominantly cnetral, though some is directed posterior into  LA. . Moderate to severe mitral valve regurgitation.  5. Tricuspid valve regurgitation is moderate.  6. The aortic valve is tricuspid. Aortic valve regurgitation is mild.  Mild aortic valve sclerosis is present, with no evidence of aortic valve  stenosis.  7. The inferior vena cava is dilated in size with <50% respiratory  variability, suggesting right atrial pressure of 15 mmHg.   Patient Profile     65 y.o. female with a hx of uncontrolled HTNand now admitted with HTN emergency and atrial flutter.  Assessment & Plan    1. Paroxysmal atrial flutter: -Converted to NSR yesterday and transitioned to PO Diltiazem 360mg  -Rates today 70-80's however having quite a bit of ectopy with PVCs and ventricular bigeminy. K + stable. Will obtain Mg+ level this AM  -Anticoagulated  with Heparin given the need for up coming procedure   2. HTN: -Uncontrolled at admission with improvement to 159/72>>124/60>>123/68 -Imdur added to regimen yesterday and an increase in Diltiazem for better rate control -Renal artery [place] with 1-59% stenosis of the right renal artery>>normal left  -BP today at 159/72>>124/60>>123/68>137/103   3. SOB/Edema: -Given one dose IV Lasix yesterday due to elevated JVP -Weight, 100lb today>>98lb on admission  -I&O, net positive  -Appears  euvolemic on exam   4. PAD with hx of gangrene LE>>followed by [facility]: -ABI at 0.5>>plan for aortogram with bilat runoff with possible intervention today, 08/16/20 -Continue Heparin infusion   5. CAD per CTA: -With 3VD with severe coronary calcifications -Denies anginal symptoms  -No ASA due to the need for [facility] from AF    6. Anemia: -Hb today at 8.4>>up from 8.0 yesterday     Signed, Georgie Chard NP-C [facility] Pager: [phone] 08/16/2020, 7:52 AM     For questions or updates, please contact   Please consult [email or web address] for contact info under Cardiology/STEMI.  The patient was seen, examined and discussed with Georgie Chard, NP  and I agree with the above.   The patient remains in sinus rhythm with frequent PVCs, she is awaiting lower extremity angiogram scheduled for today, she remains hypertensive with heart rate in 70s and frequent PVCs, I increased Cardizem to 360 mg daily.  Her renal arterial ultrasound showed 1-59% stenosis of the right renal artery. Abnormal right Resistive Index. Normal size right kidney. Left: No evidence of left renal artery stenosis. Abnormal left Resistive Index. Normal size of left kidney. She appears euvolemic.BP at goal. She will require anticoagulation post angiogram for a-fib and DVT, Eliquis is preferred. We will sign off, call us with questions.   Tobias Alexander, MD 08/16/2020

## 2020-08-16 NOTE — Progress Notes (Signed)
ANTICOAGULATION CONSULT NOTE  Pharmacy Consult for heparin  Indication: atrial fibrillation  No Known Allergies  Patient Measurements: Height: 5\' 2"  (157.5 cm) Weight: 45.4 kg (100 lb 1.6 oz) IBW/kg (Calculated) : 50.1 Heparin Dosing Weight: HEPARIN DW (KG): 45.4   Vital Signs: Temp: 97.5 F (36.4 C) (03/18 0336) Temp Source: Oral (03/18 0336) BP: 159/72 (03/18 0336) Pulse Rate: 70 (03/18 0336)  Labs: Recent Labs    08/14/20 0343 08/15/20 0219 08/16/20 0251  HGB 8.7* 8.0* 8.4*  HCT 27.2* 25.5* 26.1*  PLT 169 167 172  HEPARINUNFRC 0.29* 0.35 0.28*  CREATININE 0.76  --  0.80    Estimated Creatinine Clearance: 50.9 mL/min (by C-G formula based on SCr of 0.8 mg/dL).   Assessment: 65 y.o. female with Afib on heparin. She is noted with PAD and plans for angiogram on Friday.   Heparin level remains slightly below goal (0.28) on gtt at 1300 units/hr. No issues with line or bleeding reported per RN.  Goal of Therapy:  Heparin level 0.3-0.7 units/ml Monitor platelets by anticoagulation protocol: Yes   Plan:  Increase heparin to 1400 units/hr F/u post angiogram  Saturday, PharmD, BCPS Please see amion for complete clinical pharmacist phone list 08/16/2020 4:30 AM

## 2020-08-17 LAB — CBC
HCT: 25.2 % — ABNORMAL LOW (ref 36.0–46.0)
Hemoglobin: 8.2 g/dL — ABNORMAL LOW (ref 12.0–15.0)
MCH: 29.5 pg (ref 26.0–34.0)
MCHC: 32.5 g/dL (ref 30.0–36.0)
MCV: 90.6 fL (ref 80.0–100.0)
Platelets: 193 10*3/uL (ref 150–400)
RBC: 2.78 MIL/uL — ABNORMAL LOW (ref 3.87–5.11)
RDW: 15.6 % — ABNORMAL HIGH (ref 11.5–15.5)
WBC: 8.6 10*3/uL (ref 4.0–10.5)
nRBC: 0 % (ref 0.0–0.2)

## 2020-08-17 LAB — BASIC METABOLIC PANEL
Anion gap: 7 (ref 5–15)
BUN: 11 mg/dL (ref 8–23)
CO2: 20 mmol/L — ABNORMAL LOW (ref 22–32)
Calcium: 8.8 mg/dL — ABNORMAL LOW (ref 8.9–10.3)
Chloride: 112 mmol/L — ABNORMAL HIGH (ref 98–111)
Creatinine, Ser: 0.71 mg/dL (ref 0.44–1.00)
GFR, Estimated: >60 mL/min (ref >60)
Glucose, Bld: 96 mg/dL (ref 70–99)
Potassium: 3 mmol/L — ABNORMAL LOW (ref 3.5–5.1)
Sodium: 139 mmol/L (ref 135–145)

## 2020-08-17 LAB — VANCOMYCIN, PEAK: Vancomycin Pk: 24 ug/mL — ABNORMAL LOW (ref 30–40)

## 2020-08-17 LAB — HEPARIN LEVEL (UNFRACTIONATED)
Heparin Unfractionated: 0.23 IU/mL — ABNORMAL LOW (ref 0.30–0.70)
Heparin Unfractionated: 0.37 IU/mL (ref 0.30–0.70)

## 2020-08-17 LAB — VANCOMYCIN, TROUGH: Vancomycin Tr: 9 ug/mL — ABNORMAL LOW (ref 15–20)

## 2020-08-17 MED ORDER — POTASSIUM CHLORIDE CRYS ER 20 MEQ PO TBCR
40.0000 meq | EXTENDED_RELEASE_TABLET | Freq: Once | ORAL | Status: AC
  Administered 2020-08-17: 40 meq via ORAL
  Filled 2020-08-17: qty 2

## 2020-08-17 MED ORDER — VANCOMYCIN HCL 1000 MG/200ML IV SOLN
1000.0000 mg | INTRAVENOUS | Status: DC
  Administered 2020-08-17 – 2020-08-20 (×4): 1000 mg via INTRAVENOUS
  Filled 2020-08-17 (×4): qty 200

## 2020-08-17 NOTE — Progress Notes (Signed)
ANTICOAGULATION CONSULT NOTE   Pharmacy Consult for heparin  Indication: atrial fibrillation  No Known Allergies  Patient Measurements: Height: 5\' 2"  (157.5 cm) Weight: 46.8 kg (103 lb 1.6 oz) IBW/kg (Calculated) : 50.1 Heparin Dosing Weight: HEPARIN DW (KG): 45.4   Vital Signs: Temp: 97.9 F (36.6 C) (03/19 0507) Temp Source: Oral (03/19 0507) BP: 153/70 (03/19 0522) Pulse Rate: 77 (03/19 0507)  Labs: Recent Labs    08/15/20 0219 08/16/20 0251 08/17/20 0041 08/17/20 0747  HGB 8.0* 8.4* 8.2*  --   HCT 25.5* 26.1* 25.2*  --   PLT 167 172 193  --   HEPARINUNFRC 0.35 0.28* 0.23* 0.37  CREATININE  --  0.80 0.71  --     Estimated Creatinine Clearance: 52.5 mL/min (by C-G formula based on SCr of 0.71 mg/dL).   Assessment: 65 y.o. female with Afib on heparin. She is noted with PAD s/p agniogram and plans are for bypass with amputation of left first toe on 3/22.   Heparin level therapeutic at 0.37 after rate increase to 1500 units/hr. No issues with line or bleeding reported per RN. Hgb 8.2 (stable).   Goal of Therapy:  Heparin level 0.3-0.7 units/ml Monitor platelets by anticoagulation protocol: Yes   Plan:  Continue IV heparin 1500 units/hr Daily heparin level, CBC Monitor s/s bleeding  4/22, PharmD PGY1 Pharmacy Resident 08/17/2020 9:21 AM  Please check [email or web address] for unit-specific pharmacy phone numbers.

## 2020-08-17 NOTE — Progress Notes (Signed)
[facility]                                                                              Patient Demographics  Sarah Bonilla, is a 65 y.o. female, DOB - 1955/11/28, MOQ:[id number]  Admit date - 08/10/2020   Admitting Physician Frankey Shown, DO  Outpatient Primary MD for the patient is Patient, No Pcp Per  Outpatient specialists:   LOS - 6  days   Medical records reviewed and are as summarized below:    No chief complaint on file.      Brief summary   Patient is a 65 year old female with history of poorly controlled hypertension due to medication and dietary noncompliance, tobacco abuse with COPD, chronic diastolic CHF and chronic left lower extremity DVT who presented to the [facility] ED with complaints of some leg swelling over the last 1 month which started after she stubbedher left great toe.Patient was admitted with hypertensive crisis and new onset atrial flutter.  Cardiology consulted.She was also noted to have left great toe gangrene with ABI demonstrating moderate PAD. General surgery recommended transfer to [facility] for vascular surgery evaluation. Vascular surgery was consulted, started on vancomycin.  3/18: Underwent aortogram with bilateral lower extremity runoff, showed severe bilateral popliteal and tibial artery occlusive disease one-vessel diseased peroneal runoff bilaterally, pending vein mapping today Per vascular surgery, patient will be scheduled for left superficial femoral to peroneal artery bypass with amputation of left first toe on Tuesday, 3/22  08/17/2020: Patient seen.  Blood pressure control is slowly improving.  Significant hypokalemia is noted (potassium of 3).  Potassium has been supplemented.  Will check plasma aldosterone concentration/plasma renin activity to rule out possible primary hyperaldosteronism, however, patient is currently on spironolactone.    Assessment & Plan    Principal Problem: Left great toe pain  secondary to cellulitis/dry gangrene in the setting of moderate PAD -Continue IV vancomycin, Flagyl, Rocephin -Vascular surgery consulted, underwent aortogram with b/l lower extremity runoff, showed severe bilateral popliteal and tibial artery occlusive disease, pending vein mapping today - plan for bypass with left first toe amputation on Tuesday 3/22 - cont pain control with oxycodone PRN 08/17/2020: Left foot is round.  For surgery on Tuesday, 08/20/2020.  Active Problems:   Hypertensive emergency -Off the Cardene drip.  Cardiology following. -Continue Cardizem, increase to 360 mg daily, Imdur, hydralazine, lisinopril, Coreg, spironolactone -Renal ultrasound showed 1-59% stenosis of right renal artery, no evidence of left renal artery stenosis 08/17/2020: Blood pressure control is improving.  Significant hypokalemia.  Rule out primary hyperaldosteronism (screening may impaired as patient is currently may be impaired due to the father patient is currently on on).  Paroxysmal,  Atrial flutter (HCC), new onset -Rate controlled, continue Cardizem, Coreg -Resume heparin drip at 6 PM today 08/17/2020: Patient is currently on heparin drip, Coreg and Cardizem.    Normocytic anemia, possibly anemia of chronic disease - baseline hemoglobin ~8 -FOBT negative, H&H improving, continue IV heparin drip  08/17/2020: Hemoglobin is stable.  Hypokalemia 08/17/2020: Potassium is 3.  Continue to replete.  Rule out primary hyperparathyroidism.  Chronic left lower extremity DVT -Currently on heparin  drip while vascular interventions are planned -Need to be more compliant with oral anticoagulation, start NOAC after interventions are completed  COPD with nicotine abuse -Currently no wheezing, stable, continue as needed bronchodilators -Counseled on nicotine cessation   Code Status: Full CODE STATUS DVT Prophylaxis:     Level of Care: Level of care: Telemetry Cardiac Family Communication: Discussed all  imaging results, lab results, explained to the patient    Disposition Plan:     Status is: Inpatient  Remains inpatient appropriate because:Inpatient level of care appropriate due to severity of illness   Dispo: The patient is from: Home              Anticipated d/c is to: Home              Patient currently is not medically stable to d/c.  Pending vascular interventions for left toe gangrene   Difficult to place patient No      Time Spent in minutes 35 minutes  Procedures:  2D echo 3/18 aortogram with bilateral lower extremity runoff, showed severe bilateral popliteal and tibial artery occlusive disease one-vessel diseased peroneal runoff bilaterally  Consultants:   Cardiology General surgery Vascular surgery  Antimicrobials:   Anti-infectives (From admission, onward)   Start     Dose/Rate Route Frequency Ordered Stop   08/13/20 1400  metroNIDAZOLE (FLAGYL) tablet 500 mg        500 mg Oral Every 8 hours 08/13/20 1026     08/13/20 1115  cefTRIAXone (ROCEPHIN) 1 g in sodium chloride 0.9 % 100 mL IVPB        1 g 200 mL/hr over 30 Minutes Intravenous Every 24 hours 08/13/20 1026     08/11/20 2200  vancomycin (VANCOREADY) IVPB 750 mg/150 mL        750 mg 150 mL/hr over 60 Minutes Intravenous Every 24 hours 08/11/20 0019     08/11/20 0015  vancomycin (VANCOCIN) IVPB 1000 mg/200 mL premix        1,000 mg 200 mL/hr over 60 Minutes Intravenous  Once 08/11/20 0012 08/11/20 0306   08/10/20 2345  cefTRIAXone (ROCEPHIN) 1 g in sodium chloride 0.9 % 100 mL IVPB        1 g 200 mL/hr over 30 Minutes Intravenous  Once 08/10/20 2340 08/11/20 0100         Medications  Scheduled Meds: . carvedilol  25 mg Oral BID WC  . diltiazem  360 mg Oral Daily  . docusate sodium  100 mg Oral BID  . hydrALAZINE  100 mg Oral Q8H  . isosorbide mononitrate  60 mg Oral Daily  . lisinopril  40 mg Oral Daily  . metroNIDAZOLE  500 mg Oral Q8H  . mupirocin ointment  1 application Nasal BID  .  pantoprazole  40 mg Oral Daily  . potassium chloride  40 mEq Oral Once  . sodium chloride flush  3 mL Intravenous Q12H  . sodium chloride flush  3 mL Intravenous Q12H  . spironolactone  25 mg Oral Daily   Continuous Infusions: . sodium chloride Stopped (08/11/20 2249)  . sodium chloride    . cefTRIAXone (ROCEPHIN)  IV 1 g (08/17/20 1102)  . heparin 1,500 Units/hr (08/17/20 1244)  . vancomycin Stopped (08/16/20 2319)   PRN Meds:.sodium chloride, sodium chloride, acetaminophen **OR** acetaminophen, hydrALAZINE, HYDROmorphone (DILAUDID) injection, labetalol, levalbuterol, morphine injection, ondansetron **OR** ondansetron (ZOFRAN) IV, oxyCODONE, polyethylene glycol, sodium chloride flush, sodium chloride flush      Subjective:   Sarah North  Bonilla was seen and examined today.  BP now controlled.  No complaints.  Pain is controlled with current regimen.  No fevers or chills.   No acute events overnight.  Objective:   Vitals:   08/17/20 0048 08/17/20 0507 08/17/20 0522 08/17/20 1124  BP: (!) 160/79 (!) 153/70 (!) 153/70 (!) 165/79  Pulse: 81 77  86  Resp: Temp: 97.7 F (36.5 C) 97.9 F (36.6 C)  98.2 F (36.8 C)  TempSrc: Oral Oral  Oral  SpO2: 93% 96%  92%  Weight:  46.8 kg    Height:        Intake/Output Summary (Last 24 hours) at 08/17/2020 1334 Last data filed at 08/17/2020 0126 Gross per 24 hour  Intake 603.5 ml  Output --  Net 603.5 ml     Wt Readings from Last 3 Encounters:  08/17/20 46.8 kg  04/23/20 40 kg  12/20/17 48.5 kg   Physical Exam  General: Patient is not in any distress.  Patient is awake and alert.    Cardiovascular: S1 S2   Respiratory: CTAB  Gastrointestinal: Soft, nontender, nondistended, NBS  Ext: no pedal edema bilaterally.  Left foot is wrapped. Neuro: Awake and alert.  Patient moves all extremities.    Data Reviewed:  I have personally reviewed following labs and imaging studies  Micro Results Recent Results (from the  past 240 hour(s))  Blood culture (routine x 2)     Status: None   Collection Time: 08/10/20 11:53 PM   Specimen: BLOOD RIGHT FOREARM  Result Value Ref Range Status   Specimen Description BLOOD RIGHT FOREARM  Final   Special Requests   Final    BOTTLES DRAWN AEROBIC AND ANAEROBIC Blood Culture adequate volume   Culture   Final    NO GROWTH 5 DAYS Performed at [facility], [address] [place], [place] [address]    Report Status 08/16/2020 FINAL  Final  Blood culture (routine x 2)     Status: None   Collection Time: 08/11/20 12:00 AM   Specimen: BLOOD RIGHT ARM  Result Value Ref Range Status   Specimen Description BLOOD RIGHT ARM  Final   Special Requests   Final    BOTTLES DRAWN AEROBIC AND ANAEROBIC Blood Culture results may not be optimal due to an excessive volume of blood received in culture bottles   Culture   Final    NO GROWTH 5 DAYS Performed at [facility], [address], [place], [place] [address]    Report Status 08/16/2020 FINAL  Final  Resp Panel by RT-PCR (Flu A&B, Covid) Nasopharyngeal Swab     Status: None   Collection Time: 08/11/20  1:07 AM   Specimen: Nasopharyngeal Swab; Nasopharyngeal(NP) swabs in vial transport medium  Result Value Ref Range Status   SARS Coronavirus 2 by RT PCR NEGATIVE NEGATIVE Final    Comment: (NOTE) SARS-CoV-2 target nucleic acids are NOT DETECTED.  The SARS-CoV-2 RNA is generally detectable in upper respiratory specimens during the acute phase of infection. The lowest concentration of SARS-CoV-2 viral copies this assay can detect is 138 copies/mL. A negative result does not preclude SARS-Cov-2 infection and should not be used as the sole basis for treatment or other patient management decisions. A negative result may occur with  improper specimen collection/handling, submission of specimen other than nasopharyngeal swab, presence of viral mutation(s) within the areas targeted by this assay, and inadequate number of  viral copies(<138 copies/mL). A negative result must be  combined with clinical observations, patient history, and epidemiological information. The expected result is Negative.  Fact Sheet for Patients:  BloggerCourse.com  Fact Sheet for Healthcare Providers:  SeriousBroker.it  This test is no t yet approved or cleared by the [place] FDA and  has been authorized for detection and/or diagnosis of SARS-CoV-2 by FDA under an Emergency Use Authorization (EUA). This EUA will remain  in effect (meaning this test can be used) for the duration of the COVID-19 declaration under Section 564(b)(1) of the Act, 21 U.S.C.section 360bbb-3(b)(1), unless the authorization is terminated  or revoked sooner.       Influenza A by PCR NEGATIVE NEGATIVE Final   Influenza B by PCR NEGATIVE NEGATIVE Final    Comment: (NOTE) The Xpert Xpress SARS-CoV-2/FLU/RSV plus assay is intended as an aid in the diagnosis of influenza from Nasopharyngeal swab specimens and should not be used as a sole basis for treatment. Nasal washings and aspirates are unacceptable for Xpert Xpress SARS-CoV-2/FLU/RSV testing.  Fact Sheet for Patients: BloggerCourse.com  Fact Sheet for Healthcare Providers: SeriousBroker.it  This test is not yet approved or cleared by the [place] FDA and has been authorized for detection and/or diagnosis of SARS-CoV-2 by FDA under an Emergency Use Authorization (EUA). This EUA will remain in effect (meaning this test can be used) for the duration of the COVID-19 declaration under Section 564(b)(1) of the Act, 21 U.S.C. section 360bbb-3(b)(1), unless the authorization is terminated or revoked.  Performed at [facility], [address] [place], [place] [address]   MRSA PCR Screening     Status: None   Collection Time: 08/11/20  9:12 AM   Specimen: Nasal Mucosa; Nasopharyngeal   Result Value Ref Range Status   MRSA by PCR NEGATIVE NEGATIVE Final    Comment:        The GeneXpert MRSA Assay (FDA approved for NASAL specimens only), is one component of a comprehensive MRSA colonization surveillance program. It is not intended to diagnose MRSA infection nor to guide or monitor treatment for MRSA infections. Performed at [facility], [address], [place], [place] [address]   Surgical PCR screen     Status: None   Collection Time: 08/16/20  7:43 AM   Specimen: Nasal Mucosa; Nasal Swab  Result Value Ref Range Status   MRSA, PCR NEGATIVE NEGATIVE Final   Staphylococcus aureus NEGATIVE NEGATIVE Final    Comment: (NOTE) The Xpert SA Assay (FDA approved for NASAL specimens in patients 23 years of age and older), is one component of a comprehensive surveillance program. It is not intended to diagnose infection nor to guide or monitor treatment. Performed at [facility], [address], [place], [place] [address]     Radiology Reports CT Angio Chest PE W and/or Wo Contrast  Result Date: 08/11/2020 CLINICAL DATA:  Pulmonary embolus suspected. Swelling left lower extremity. Injured for months. EXAM: CT ANGIOGRAPHY CHEST WITH CONTRAST TECHNIQUE: Multidetector CT imaging of the chest was performed using the standard protocol during bolus administration of intravenous contrast. Multiplanar CT image reconstructions and MIPs were obtained to evaluate the vascular anatomy. CONTRAST:  OMNIPAQUE IOHEXOL 350 MG/ML SOLN COMPARISON:  Chest x-ray 08/10/2020 FINDINGS: Cardiovascular: Satisfactory opacification of the pulmonary arteries to the segmental level. No evidence of pulmonary embolism. The main pulmonary artery is normal in caliber. Mild atherosclerotic plaque of the thoracic aorta. severe left anterior descending left circumflex as well as at least mild right main coronary artery calcifications. Mediastinum/Nodes: No enlarged mediastinal, hilar,  or axillary  lymph nodes. Thyroid gland, trachea, and esophagus demonstrate no significant findings. Lungs/Pleura: Centrilobular emphysematous changes. Bilateral lower lobe subsegmental atelectasis. Limited evaluation for pulmonary nodularity due to motion artifact. No pulmonary mass. No focal consolidation. No pleural effusion. No pneumothorax. Upper Abdomen: Status post cholecystectomy. Atherosclerotic plaque. No acute abnormality. Musculoskeletal: No chest wall abnormality. Likely bone island of the left posterior eighth rib. No suspicious lytic or blastic osseous lesions. No acute displaced fracture. Review of the MIP images confirms the above findings. IMPRESSION: 1. No central or segmental pulmonary embolus. Limited evaluation at the subsegmental level due to motion artifact. 2. No acute intrapulmonary abnormality. 3. Aortic Atherosclerosis (ICD10-I70.0) including 3 vessel mild to severe coronary artery calcification. 4.  Emphysema (ICD10-J43.9). Electronically Signed   By: Tish Frederickson M.D.   On: 08/11/2020 04:06   PERIPHERAL VASCULAR CATHETERIZATION  Result Date: 08/16/2020 Procedure: Abdominal aortogram with bilateral lower extremity runoff, second order catheterization left external iliac artery Preoperative diagnosis: Gangrene right first toe Postoperative diagnosis: Same Anesthesia: Local Operative findings: 1.  Severe bilateral popliteal and tibial artery occlusive disease one-vessel diseased peroneal runoff bilaterally Operative details: After pain informed consent, patient was removed with PV lab.  The patient was placed in supine position angio table.  Both groins were prepped and draped in usual sterile fashion.  Local anesthesia was notewriter of the right common femoral artery.  Ultrasound was used to identify the right common femoral artery and femoral bifurcation.  An introducer needle was used to cannulate the right common femoral artery and an 035 Bentson wire advanced up the abdominal aorta under  fluoroscopic guidance.  A 5 French sheath placed over the guidewire in the right common femoral artery.  5 French pigtail catheter was then placed over the guidewire in the abdominal aorta.  Abdominal aortogram was obtained in AP projection.  Left and right renal arteries were patent.  Infrarenal abdominal aorta is patent.  Left and right common external and internal iliac arteries are all patent.  Next the pigtail catheter was pulled down distal to the aortic bifurcation and bilateral oblique views of the pelvis were performed.  This confirmed the above findings.  At this point bilateral lower extremity runoff views were obtained through the pigtail catheter. In the left lower extremity, the left common femoral profunda femoris is patent.  Left superficial femoral artery is patent.  The above-knee popliteal artery is patent but severely diseased with multiple segments of 50 to 80% stenosis.  The below-knee popliteal artery is occluded.  The anterior tibial artery is occluded.  The posterior tibial artery is occluded.  The peroneal artery does reconstitute via collaterals in the distal third of the leg and fills the foot. In the right lower extremity, there are similar findings to the left. At this point to further define the tibial disease in the left leg the 5 French pigtail catheter was removed over guidewire and exchanged for a 5 [place] crossover catheter.  This was used to selectively catheterize the left common iliac artery.  The Bentson wire was then advanced through the crossover catheter down into the distal left common femoral artery.  The 5 [place] crossover catheter was swapped out for a 5 [place] straight catheter and additional lower extremity views were obtained on the left side.  This confirmed the above findings. At this point the 5 French straight catheter was removed and additional views were obtained of the right lower extremity through the sheath.  This confirmed the above findings as well.  At  this point the procedure was concluded.  The patient tolerated procedure well and there were no complications.  Patient was taken to the holding area in stable condition. Operative management: The patient will be scheduled for a left superficial femoral to peroneal artery bypass with amputation of left first toe next Tuesday, August 20, 2020.  She will have bilateral lower extremity vein mapping performed later today to see if she has reasonable conduit to go to her peroneal artery.  She can resume her heparin at 6 PM today. Fabienne Bruns, MD [facility] Office: [phone]  US ARTERIAL ABI (SCREENING LOWER EXTREMITY)  Result Date: 08/12/2020 CLINICAL DATA:  Left first toe gangrene.  History of hypertension. EXAM: NONINVASIVE PHYSIOLOGIC VASCULAR STUDY OF BILATERAL LOWER EXTREMITIES TECHNIQUE: Evaluation of both lower extremities were performed at rest, including calculation of ankle-brachial indices with single level Doppler, pressure and pulse volume recording. COMPARISON:  None. FINDINGS: Right ABI:  0.65 Left ABI:  0.51 Right Lower Extremity: Monophasic posterior tibial and dorsalis pedis waveforms. Left Lower Extremity: Monophasic posterior tibial and dorsalis pedis waveforms. 0.5-0.79 Moderate PAD IMPRESSION: Evidence of moderate peripheral vascular disease in both lower extremities with resting ankle-brachial indices of 0.65 on the right and 0.51 on the left. Distal waveforms are monophasic bilaterally. Consider further evaluation with CT angiography of the abdominal aorta with bilateral iliofemoral runoff for further characterization of arterial occlusive disease. Electronically Signed   By: Irish Lack M.D.   On: 08/12/2020 11:05   DG Chest Portable 1 View  Result Date: 08/10/2020 CLINICAL DATA:  Hypoxia EXAM: PORTABLE CHEST 1 VIEW COMPARISON:  04/19/2020 FINDINGS: The lungs are hyperinflated with diffuse interstitial prominence. No focal airspace consolidation  or pulmonary edema. No pleural effusion or pneumothorax. Normal cardiomediastinal contours. IMPRESSION: COPD without acute airspace disease. Electronically Signed   By: Deatra Robinson M.D.   On: 08/10/2020 23:58   DG Foot Complete Left  Result Date: 08/10/2020 CLINICAL DATA:  Left great toe swelling. EXAM: LEFT FOOT - COMPLETE 3+ VIEW COMPARISON:  None. FINDINGS: There is no evidence of fracture or dislocation. There is diffuse osteopenia with mild degenerative changes seen along the dorsal aspect of the proximal to mid left foot. Marked severity soft tissue swelling is seen throughout the left great toe. IMPRESSION: Marked severity soft tissue swelling throughout the left great toe without evidence of an acute osseous abnormality. Electronically Signed   By: Aram Candela M.D.   On: 08/10/2020 22:10   VAS [place] LOWER EXTREMITY SAPHENOUS VEIN MAPPING  Result Date: 08/16/2020 LOWER EXTREMITY VEIN MAPPING Indications:  PAD Risk Factors: Hypertension.  Comparison Study: No prior studies. Performing Technologist: Chanda Busing RVT  Examination Guidelines: A complete evaluation includes B-mode imaging, spectral Doppler, color Doppler, and power Doppler as needed of all accessible portions of each vessel. Bilateral testing is considered an integral part of a complete examination. Limited examinations for reoccurring indications may be performed as noted. +---------------+-----------+----------------------+---------------+-----------+   RT Diameter  RT Findings         GSV            LT Diameter  LT Findings      (cm)                                            (cm)                  +---------------+-----------+----------------------+---------------+-----------+  0.49                     Saphenofemoral         0.43                                                   Junction                                  +---------------+-----------+----------------------+---------------+-----------+       0.28                     Proximal thigh         0.34       branching  +---------------+-----------+----------------------+---------------+-----------+      0.31                       Mid thigh            0.22       branching  +---------------+-----------+----------------------+---------------+-----------+      0.25       branching      Distal thigh          0.26                  +---------------+-----------+----------------------+---------------+-----------+      0.19                          Knee              0.20       branching  +---------------+-----------+----------------------+---------------+-----------+      0.08       branching       Prox calf            0.16                  +---------------+-----------+----------------------+---------------+-----------+      0.08       branching        Mid calf            0.19                  +---------------+-----------+----------------------+---------------+-----------+      0.19       branching      Distal calf           0.20       branching  +---------------+-----------+----------------------+---------------+-----------+    Preliminary    ECHOCARDIOGRAM COMPLETE  Result Date: 08/12/2020    ECHOCARDIOGRAM REPORT   Patient Name:   Sarah Bonilla Date of Exam: 08/12/2020 Medical Rec #:  [id number]        Height:       62.0 in Accession #:    [id number]       Weight:       98.5 lb Date of Birth:  02/23/1956        BSA:          1.415 m Patient Age:    65 years         BP:           171/85 mmHg Patient Gender: F  HR:           88 bpm. [facility] Procedure: 2D Echo, Cardiac Doppler and Color Doppler Indications:    Abnormal ECG R94.31  History:        Patient has prior history of Echocardiogram examinations, most                 recent 04/19/2020. CHF, Previous Myocardial Infarction, COPD;                 Risk Factors:Hypertension. Tobacco abuse.  Sonographer:    Celesta GentileBernard White RCS Referring  Phys: [id number] PRATIK D [facility] IMPRESSIONS  1. Hypokinesis of the distal anteroseptal distal anterolateral wall; akinesis of the apex. . Left ventricular ejection fraction, by estimation, is 55 to 60%. The left ventricle has normal function. Left ventricular diastolic parameters are consistent with Grade II diastolic dysfunction (pseudonormalization).  2. Right ventricular systolic function is normal. The right ventricular size is normal. Mildly increased right ventricular wall thickness. There is moderately elevated pulmonary artery systolic pressure.  3. Left atrial size was severely dilated.  4. MR is predominantly cnetral, though some is directed posterior into LA. . Moderate to severe mitral valve regurgitation.  5. Tricuspid valve regurgitation is moderate.  6. The aortic valve is tricuspid. Aortic valve regurgitation is mild. Mild aortic valve sclerosis is present, with no evidence of aortic valve stenosis.  7. The inferior vena cava is dilated in size with <50% respiratory variability, suggesting right atrial pressure of 15 mmHg. FINDINGS  Left Ventricle: Hypokinesis of the distal anteroseptal distal anterolateral wall; akinesis of the apex. Left ventricular ejection fraction, by estimation, is 55 to 60%. The left ventricle has normal function. The left ventricular internal cavity size was normal in size. There is no left ventricular hypertrophy. Left ventricular diastolic parameters are consistent with Grade II diastolic dysfunction (pseudonormalization). Right Ventricle: The right ventricular size is normal. Mildly increased right ventricular wall thickness. Right ventricular systolic function is normal. There is moderately elevated pulmonary artery systolic pressure. The tricuspid regurgitant velocity is 3.27 m/s, and with an assumed right atrial pressure of 8 mmHg, the estimated right ventricular systolic pressure is 50.8 mmHg. Left Atrium: Left atrial size was severely dilated. Right Atrium: Right atrial  size was normal in size. Pericardium: There is no evidence of pericardial effusion. Mitral Valve: MR is predominantly cnetral, though some is directed posterior into LA. There is mild thickening of the mitral valve leaflet(s). Moderate to severe mitral valve regurgitation. Tricuspid Valve: The tricuspid valve is normal in structure. Tricuspid valve regurgitation is moderate. Aortic Valve: The aortic valve is tricuspid. Aortic valve regurgitation is mild. Mild aortic valve sclerosis is present, with no evidence of aortic valve stenosis. Pulmonic Valve: The pulmonic valve was not well visualized. Aorta: The aortic root is normal in size and structure. Venous: The inferior vena cava is dilated in size with less than 50% respiratory variability, suggesting right atrial pressure of 15 mmHg.  LEFT VENTRICLE PLAX 2D LVIDd:         4.00 cm  Diastology LVIDs:         2.60 cm  LV e' medial:    5.98 cm/s LV PW:         1.10 cm  LV E/e' medial:  20.7 LV IVS:        1.10 cm  LV e' lateral:   8.49 cm/s LVOT diam:     1.90 cm  LV E/e' lateral: 14.6 LV SV:  63 LV SV Index:   44 LVOT Area:     2.84 cm  RIGHT VENTRICLE RV S prime:     11.70 cm/s TAPSE (M-mode): 2.0 cm LEFT ATRIUM             Index       RIGHT ATRIUM           Index LA diam:        3.10 cm 2.19 cm/m  RA Area:     15.70 cm LA Vol (A2C):   61.4 ml 43.40 ml/m RA Volume:   40.00 ml  28.27 ml/m LA Vol (A4C):   66.5 ml 47.00 ml/m LA Biplane Vol: 64.1 ml 45.31 ml/m  AORTIC VALVE LVOT Vmax:   117.00 cm/s LVOT Vmean:  78.300 cm/s LVOT VTI:    0.222 m  AORTA Ao Root diam: 2.70 cm MITRAL VALVE                 TRICUSPID VALVE MV Area (PHT): 3.46 cm      TR Peak grad:   42.8 mmHg MV Decel Time: 219 msec      TR Vmax:        327.00 cm/s MR Peak grad:    136.4 mmHg MR Mean grad:    86.0 mmHg   SHUNTS MR Vmax:         584.00 cm/s Systemic VTI:  0.22 m MR Vmean:        437.0 cm/s  Systemic Diam: 1.90 cm MR PISA:         2.26 cm MR PISA Eff ROA: 9 mm MR PISA Radius:   0.60 cm MV E velocity: 124.00 cm/s MV A velocity: 81.00 cm/s MV E/A ratio:  1.53 Dietrich Pates MD Electronically signed by Dietrich Pates MD Signature Date/Time: 08/12/2020/7:26:41 PM    Final    VAS US RENAL ARTERY DUPLEX  Result Date: 08/15/2020 ABDOMINAL VISCERAL Indications: Hypertensive emergency. High Risk Factors: Hypertension, coronary artery disease. Performing Technologist: Marilynne Halsted RDMS, RVT  Examination Guidelines: A complete evaluation includes B-mode imaging, spectral Doppler, color Doppler, and power Doppler as needed of all accessible portions of each vessel. Bilateral testing is considered an integral part of a complete examination. Limited examinations for reoccurring indications may be performed as noted.  Duplex Findings: +--------------------+--------+--------+------+--------+ Mesenteric          PSV cm/sEDV cm/sPlaqueComments +--------------------+--------+--------+------+--------+ Aorta Prox             83                          +--------------------+--------+--------+------+--------+ Celiac Artery Origin  192                          +--------------------+--------+--------+------+--------+ SMA Proximal          178                          +--------------------+--------+--------+------+--------+    +------------------+--------+--------+-------+ Right Renal ArteryPSV cm/sEDV cm/sComment +------------------+--------+--------+-------+ Origin              205      35           +------------------+--------+--------+-------+ Proximal            116      17           +------------------+--------+--------+-------+ Mid  96      17           +------------------+--------+--------+-------+ Distal               74      17           +------------------+--------+--------+-------+ +-----------------+--------+--------+-------+ Left Renal ArteryPSV cm/sEDV cm/sComment +-----------------+--------+--------+-------+ Origin              111      25           +-----------------+--------+--------+-------+ Proximal           112      28           +-----------------+--------+--------+-------+ Mid                114      29           +-----------------+--------+--------+-------+ Distal             118      19           +-----------------+--------+--------+-------+ +------------+--------+--------+----+-----------+--------+--------+----+ Right KidneyPSV cm/sEDV cm/sRI  Left KidneyPSV cm/sEDV cm/sRI   +------------+--------+--------+----+-----------+--------+--------+----+ Upper Pole  24      8       0.71Upper Pole 32      9       0.71 +------------+--------+--------+----+-----------+--------+--------+----+ Mid         31      9       0.        23      7       0.69 +------------+--------+--------+----+-----------+--------+--------+----+ Lower Pole  20      6       0.72Lower Pole 32      9       0.74 +------------+--------+--------+----+-----------+--------+--------+----+ Hilar       30      9       0.70Hilar      34      10      0.70 +------------+--------+--------+----+-----------+--------+--------+----+ +------------------+-----+------------------+-----+ Right Kidney           Left Kidney             +------------------+-----+------------------+-----+ RAR                    RAR                     +------------------+-----+------------------+-----+ RAR (manual)      2.4  RAR (manual)      1.3   +------------------+-----+------------------+-----+ Cortex                 Cortex                  +------------------+-----+------------------+-----+ Cortex thickness       Corex thickness         +------------------+-----+------------------+-----+ Kidney length (cm)11.66Kidney length (cm)10.94 +------------------+-----+------------------+-----+  Summary: Renal:  Right: 1-59% stenosis of the right renal artery. Abnormal right        Resistive Index. Normal size right  kidney. Left:  No evidence of left renal artery stenosis. Abnormal left        Resisitve Index. Normal size of left kidney.  *See table(s) above for measurements and observations.  Diagnosing physician: Lemar Livings MD  Electronically signed by Lemar Livings MD on 08/15/2020 at 6:01:20 PM.    Final     Lab Data:  CBC: Recent Labs  Lab 08/13/20 0219 08/14/20 1610 08/15/20 9604  08/16/20 0251 08/17/20 0041  WBC 10.5 11.3* 11.3* 9.7 8.6  HGB 9.3* 8.7* 8.0* 8.4* 8.2*  HCT 29.0* 27.2* 25.5* 26.1* 25.2*  MCV 92.4 91.9 93.8 92.2 90.6  PLT 170 169 167 172 193   Basic Metabolic Panel: Recent Labs  Lab 08/10/20 2352 08/11/20 0552 08/12/20 0802 08/13/20 0219 08/14/20 0343 08/16/20 0251 08/17/20 0041  NA  --    < > 144 143 141 140 139  K  --    < > 3.2* 3.4* 3.9 3.6 3.0*  CL  --    < > 109 108 109 109 112*  CO2  --    < > 20*  GLUCOSE  --    < > 110* 115* 103* 101* 96  BUN  --    < > CREATININE  --    < > 0.47 0.61 0.76 0.80 0.71  CALCIUM  --    < > 9.2 9.4 9.2 9.1 8.8*  MG 1.7  --  1.7 1.7 2.1 1.9  --    < > = values in this interval not displayed.   GFR: Estimated Creatinine Clearance: 52.5 mL/min (by C-G formula based on SCr of 0.71 mg/dL). Liver Function Tests: No results for input(s): AST, ALT, ALKPHOS, BILITOT, PROT, ALBUMIN in the last 168 hours. No results for input(s): LIPASE, AMYLASE in the last 168 hours. No results for input(s): AMMONIA in the last 168 hours. Coagulation Profile: No results for input(s): INR, PROTIME in the last 168 hours. Cardiac Enzymes: No results for input(s): CKTOTAL, CKMB, CKMBINDEX, TROPONINI in the last 168 hours. BNP (last 3 results) No results for input(s): PROBNP in the last 8760 hours. HbA1C: No results for input(s): HGBA1C in the last 72 hours. CBG: No results for input(s): GLUCAP in the last 168 hours. Lipid Profile: No results for input(s): CHOL, HDL, LDLCALC, TRIG, CHOLHDL, LDLDIRECT in the last 72  hours. Thyroid Function Tests: No results for input(s): TSH, T4TOTAL, FREET4, T3FREE, THYROIDAB in the last 72 hours. Anemia Panel: No results for input(s): VITAMINB12, FOLATE, FERRITIN, TIBC, IRON, RETICCTPCT in the last 72 hours. Urine analysis: No results found for: COLORURINE, APPEARANCEUR, LABSPEC, PHURINE, GLUCOSEU, HGBUR, BILIRUBINUR, KETONESUR, PROTEINUR, UROBILINOGEN, NITRITE, LEUKOCYTESUR   Barnetta Chapel M.D. [facility] 08/17/2020, 1:34 PM  Available via Epic secure chat 7am-7pm After 7 pm, please refer to night coverage provider listed on amion.

## 2020-08-17 NOTE — Progress Notes (Signed)
Pharmacy Antibiotic Note  Sarah Bonilla is a 65 y.o. female admitted  with cellulitis and dry gangrene and plans for bypass and toe amputation next week.  Pharmacy has been consulted for vancomycin dosing (also noted on ceftriaxone and flagyl) -SCr= 0.71 -WBC= 8.6 -cultures- ngtd  Of note, a 24 hour vancomycin trough was slightly subtherapeutic at 9   Plan: -Increase vancomycin to 1000mg  IV q24h -Will follow renal function, cultures and clinical progress -Recheck vanc trough at steady state    Height: 5\' 2"  (157.5 cm) Weight: 46.8 kg (103 lb 1.6 oz) IBW/kg (Calculated) : 50.1  Temp (24hrs), Avg:98 F (36.7 C), Min:97.6 F (36.4 C), Max:98.6 F (37 C)  Recent Labs  Lab 08/10/20 2208 08/10/20 2352 08/11/20 0552 08/12/20 0802 08/13/20 0219 08/14/20 0343 08/15/20 0219 08/16/20 0251 08/17/20 0041 08/17/20 2053  WBC  --   --   --  12.0* 10.5 11.3* 11.3* 9.7 8.6  --   CREATININE  --   --    < > 0.47 0.61 0.76  --  0.80 0.71  --   LATICACIDVEN 1.1 1.5  --   --   --   --   --   --   --   --   VANCOTROUGH  --   --   --   --   --   --   --   --   --  9*  VANCOPEAK  --   --   --   --   --   --   --   --  24*  --    < > = values in this interval not displayed.    Estimated Creatinine Clearance: 52.5 mL/min (by C-G formula based on SCr of 0.71 mg/dL).    No Known Allergies    Thank you for allowing pharmacy to be a part of this patient's care.  08/19/20, PharmD., BCPS, BCCCP Clinical Pharmacist Please refer to [facility] for unit-specific pharmacist

## 2020-08-17 NOTE — Progress Notes (Signed)
ANTICOAGULATION CONSULT NOTE   Pharmacy Consult for heparin  Indication: atrial fibrillation  No Known Allergies  Patient Measurements: Height: 5\' 2"  (157.5 cm) Weight: 45.4 kg (100 lb 1.6 oz) IBW/kg (Calculated) : 50.1 Heparin Dosing Weight: HEPARIN DW (KG): 45.4   Vital Signs: Temp: 97.7 F (36.5 C) (03/19 0048) Temp Source: Oral (03/19 0048) BP: 160/79 (03/19 0048) Pulse Rate: 81 (03/19 0048)  Labs: Recent Labs    08/14/20 0343 08/15/20 0219 08/16/20 0251 08/17/20 0041  HGB 8.7* 8.0* 8.4* 8.2*  HCT 27.2* 25.5* 26.1* 25.2*  PLT 169 167 172 193  HEPARINUNFRC 0.29* 0.35 0.28* 0.23*  CREATININE 0.76  --  0.80  --     Estimated Creatinine Clearance: 50.9 mL/min (by C-G formula based on SCr of 0.8 mg/dL).   Assessment: 65 y.o. female with Afib on heparin. She is noted with PAD s/p agniogram and plans are for bypass with amputation of left first toe on 3/22.   Heparin level subtherapeutic (0.23) on gtt at 1400 units/hr. No issues with line or bleeding reported per RN. Hgb 8.2 (stable).  Goal of Therapy:  Heparin level 0.3-0.7 units/ml Monitor platelets by anticoagulation protocol: Yes   Plan:  Increase heparin to 1500 units/hr Heparin level in 6 hours  4/22, PharmD, BCPS Please see amion for complete clinical pharmacist phone list 08/17/2020 1:22 AM

## 2020-08-17 NOTE — Progress Notes (Signed)
No hematoma in groin.  Plan for bypass next Tuesday.  Vein map reviewed poor quality vein for bypass d/w pt risk benefits and procedure details as well as decreased durability of PTFE bypass to tibial vessel.  Fabienne Bruns, MD [facility] Office: [phone]

## 2020-08-18 LAB — CBC
HCT: 25.4 % — ABNORMAL LOW (ref 36.0–46.0)
Hemoglobin: 8.3 g/dL — ABNORMAL LOW (ref 12.0–15.0)
MCH: 30 pg (ref 26.0–34.0)
MCHC: 32.7 g/dL (ref 30.0–36.0)
MCV: 91.7 fL (ref 80.0–100.0)
Platelets: 208 10*3/uL (ref 150–400)
RBC: 2.77 MIL/uL — ABNORMAL LOW (ref 3.87–5.11)
RDW: 15.9 % — ABNORMAL HIGH (ref 11.5–15.5)
WBC: 9.8 10*3/uL (ref 4.0–10.5)
nRBC: 0 % (ref 0.0–0.2)

## 2020-08-18 LAB — BASIC METABOLIC PANEL
Anion gap: 10 (ref 5–15)
BUN: 11 mg/dL (ref 8–23)
CO2: 20 mmol/L — ABNORMAL LOW (ref 22–32)
Calcium: 8.8 mg/dL — ABNORMAL LOW (ref 8.9–10.3)
Chloride: 109 mmol/L (ref 98–111)
Creatinine, Ser: 0.76 mg/dL (ref 0.44–1.00)
GFR, Estimated: >60 mL/min (ref >60)
Glucose, Bld: 119 mg/dL — ABNORMAL HIGH (ref 70–99)
Potassium: 3.3 mmol/L — ABNORMAL LOW (ref 3.5–5.1)
Sodium: 139 mmol/L (ref 135–145)

## 2020-08-18 LAB — HEPARIN LEVEL (UNFRACTIONATED): Heparin Unfractionated: 0.49 IU/mL (ref 0.30–0.70)

## 2020-08-18 LAB — MAGNESIUM: Magnesium: 1.9 mg/dL (ref 1.7–2.4)

## 2020-08-18 MED ORDER — POTASSIUM CHLORIDE CRYS ER 20 MEQ PO TBCR: 40.0000 meq | EXTENDED_RELEASE_TABLET | Freq: Once | ORAL | Status: DC

## 2020-08-18 MED ORDER — POTASSIUM CHLORIDE CRYS ER 20 MEQ PO TBCR
40.0000 meq | EXTENDED_RELEASE_TABLET | ORAL | Status: AC
  Administered 2020-08-18 (×2): 40 meq via ORAL
  Filled 2020-08-18 (×2): qty 2

## 2020-08-18 MED ORDER — SPIRONOLACTONE 25 MG PO TABS
25.0000 mg | ORAL_TABLET | ORAL | Status: AC
  Administered 2020-08-18: 25 mg via ORAL
  Filled 2020-08-18: qty 1

## 2020-08-18 MED ORDER — SPIRONOLACTONE 25 MG PO TABS
50.0000 mg | ORAL_TABLET | Freq: Every day | ORAL | Status: DC
  Administered 2020-08-19 – 2020-08-21 (×2): 50 mg via ORAL
  Filled 2020-08-18 (×2): qty 2

## 2020-08-18 NOTE — Progress Notes (Signed)
ANTICOAGULATION CONSULT NOTE   Pharmacy Consult for heparin  Indication: atrial fibrillation  No Known Allergies  Patient Measurements: Height: 5\' 2"  (157.5 cm) Weight: 47.1 kg (103 lb 13.4 oz) IBW/kg (Calculated) : 50.1 Heparin Dosing Weight: HEPARIN DW (KG): 45.4   Vital Signs: Temp: 97.9 F (36.6 C) (03/20 0523) Temp Source: Oral (03/20 0523) BP: 166/76 (03/20 0523) Pulse Rate: 72 (03/20 0523)  Labs: Recent Labs    08/16/20 0251 08/17/20 0041 08/17/20 0747 08/18/20 0205  HGB 8.4* 8.2*  --  8.3*  HCT 26.1* 25.2*  --  25.4*  PLT 172 193  --  208  HEPARINUNFRC 0.28* 0.23* 0.37 0.49  CREATININE 0.80 0.71  --  0.76    Estimated Creatinine Clearance: 52.8 mL/min (by C-G formula based on SCr of 0.76 mg/dL).   Assessment: 65 y.o. female with Afib on heparin. She is noted with PAD s/p agniogram and plans are for bypass with amputation of left first toe on 3/22.   Heparin level therapeutic at 0.49 on 1500 units/hr. Hgb 8.3 (stable) and plt wnl. No bleeding noted.   Goal of Therapy:  Heparin level 0.3-0.7 units/ml Monitor platelets by anticoagulation protocol: Yes   Plan:  Continue IV heparin 1500 units/hr Daily heparin level, CBC Monitor s/s bleeding  4/22, PharmD PGY1 Pharmacy Resident 08/18/2020 7:18 AM  Please check [email or web address] for unit-specific pharmacy phone numbers.

## 2020-08-18 NOTE — Progress Notes (Signed)
[facility]                                                                              Patient Demographics  Sarah Bonilla, is a 65 y.o. female, DOB - [birth year], OZH:[id number]  Admit date - 08/10/2020   Admitting Physician Frankey Shown, DO  Outpatient Primary MD for the patient is Patient, No Pcp Per  Outpatient specialists:   LOS - 7  days   Medical records reviewed and are as summarized below:    No chief complaint on file.      Brief summary   Patient is a 65 year old female with history of poorly controlled hypertension due to medication and dietary noncompliance, tobacco abuse with COPD, chronic diastolic CHF and chronic left lower extremity DVT who presented to the [facility] ED with complaints of some leg swelling over the last 1 month which started after she stubbedher left great toe.Patient was admitted with hypertensive crisis and new onset atrial flutter.  Cardiology consulted.She was also noted to have left great toe gangrene with ABI demonstrating moderate PAD. General surgery recommended transfer to [facility] for vascular surgery evaluation. Vascular surgery was consulted, started on vancomycin.  3/18: Underwent aortogram with bilateral lower extremity runoff, showed severe bilateral popliteal and tibial artery occlusive disease one-vessel diseased peroneal runoff bilaterally, pending vein mapping today Per vascular surgery, patient will be scheduled for left superficial femoral to peroneal artery bypass with amputation of left first toe on Tuesday, 3/22  08/17/2020: Patient seen.  Blood pressure control is slowly improving.  Significant hypokalemia is noted (potassium of 3).  Potassium has been supplemented.  Will check plasma aldosterone concentration/plasma renin activity to rule out possible primary hyperaldosteronism, however, patient is currently on spironolactone.  08/18/2020: Patient seen.  Blood pressure still uncontrolled.  Results  of plasma aldosterone concentration and plasma renin activity will be back on Tuesday, 08/20/2020.  Will increase Aldactone from 25 Mg p.o. once daily to 50 Mg p.o. once daily.  Assessment & Plan    Principal Problem: Left great toe pain secondary to cellulitis/dry gangrene in the setting of moderate PAD -Continue IV vancomycin, Flagyl, Rocephin -Vascular surgery consulted, underwent aortogram with b/l lower extremity runoff, showed severe bilateral popliteal and tibial artery occlusive disease, pending vein mapping today - plan for bypass with left first toe amputation on Tuesday 3/22 - cont pain control with oxycodone PRN 08/18/2020: Left foot is wrapped.  For surgery on Tuesday, 08/20/2020.  Active Problems:   Hypertensive emergency -Off the Cardene drip.  Cardiology following. -Continue Cardizem, increase to 360 mg daily, Imdur, hydralazine, lisinopril, Coreg, spironolactone -Renal ultrasound showed 1-59% stenosis of right renal artery, no evidence of left renal artery stenosis 08/17/2020: Blood pressure control is improving.  Significant hypokalemia.  Rule out primary hyperaldosteronism (screening may impaired as patient is currently may be impaired due to the father patient is currently on on). 08/18/2020: Blood pressure still not controlled.  Follow results of plasma aldosterone concentration and plasma renin activity.  Increase Aldactone from 25 Mg p.o. once daily to 50 Mg p.o. once daily.  Paroxysmal,  Atrial flutter (HCC), new onset -Rate controlled,  continue Cardizem, Coreg -Resume heparin drip at 6 PM today 08/17/2020: Patient is currently on heparin drip, Coreg and Cardizem.    Normocytic anemia, possibly anemia of chronic disease - baseline hemoglobin ~8 -FOBT negative, H&H improving, continue IV heparin drip  08/17/2020: Hemoglobin is stable.  Hypokalemia 08/17/2020: Potassium is 3.  Continue to replete.  Rule out primary hyperparathyroidism. 08/18/2020: Potassium is 3.3 today.   We will continue to replete.  Chronic left lower extremity DVT -Currently on heparin drip while vascular interventions are planned -Need to be more compliant with oral anticoagulation, start NOAC after interventions are completed  COPD with nicotine abuse -Currently no wheezing, stable, continue as needed bronchodilators -Counseled on nicotine cessation   Code Status: Full CODE STATUS DVT Prophylaxis:     Level of Care: Level of care: Telemetry Cardiac Family Communication: Discussed all imaging results, lab results, explained to the patient    Disposition Plan:     Status is: Inpatient  Remains inpatient appropriate because:Inpatient level of care appropriate due to severity of illness   Dispo: The patient is from: Home              Anticipated d/c is to: Home              Patient currently is not medically stable to d/c.  Pending vascular interventions for left toe gangrene   Difficult to place patient No      Time Spent in minutes 25 minutes  Procedures:  2D echo 3/18 aortogram with bilateral lower extremity runoff, showed severe bilateral popliteal and tibial artery occlusive disease one-vessel diseased peroneal runoff bilaterally  Consultants:   Cardiology General surgery Vascular surgery  Antimicrobials:   Anti-infectives (From admission, onward)   Start     Dose/Rate Route Frequency Ordered Stop   08/17/20 2300  vancomycin (VANCOREADY) IVPB 1000 mg/200 mL        1,000 mg 200 mL/hr over 60 Minutes Intravenous Every 24 hours 08/17/20 2155     08/13/20 1400  metroNIDAZOLE (FLAGYL) tablet 500 mg        500 mg Oral Every 8 hours 08/13/20 1026     08/13/20 1115  cefTRIAXone (ROCEPHIN) 1 g in sodium chloride 0.9 % 100 mL IVPB        1 g 200 mL/hr over 30 Minutes Intravenous Every 24 hours 08/13/20 1026     08/11/20 2200  vancomycin (VANCOREADY) IVPB 750 mg/150 mL  Status:  Discontinued        750 mg 150 mL/hr over 60 Minutes Intravenous Every 24 hours  08/11/20 0019 08/17/20 2155   08/11/20 0015  vancomycin (VANCOCIN) IVPB 1000 mg/200 mL premix        1,000 mg 200 mL/hr over 60 Minutes Intravenous  Once 08/11/20 0012 08/11/20 0306   08/10/20 2345  cefTRIAXone (ROCEPHIN) 1 g in sodium chloride 0.9 % 100 mL IVPB        1 g 200 mL/hr over 30 Minutes Intravenous  Once 08/10/20 2340 08/11/20 0100         Medications  Scheduled Meds: . carvedilol  25 mg Oral BID WC  . diltiazem  360 mg Oral Daily  . docusate sodium  100 mg Oral BID  . hydrALAZINE  100 mg Oral Q8H  . isosorbide mononitrate  60 mg Oral Daily  . lisinopril  40 mg Oral Daily  . metroNIDAZOLE  500 mg Oral Q8H  . mupirocin ointment  1 application Nasal BID  . pantoprazole  40 mg  Oral Daily  . potassium chloride  40 mEq Oral Q4H  . sodium chloride flush  3 mL Intravenous Q12H  . sodium chloride flush  3 mL Intravenous Q12H  . spironolactone  50 mg Oral Daily   Continuous Infusions: . sodium chloride Stopped (08/11/20 2249)  . sodium chloride    . cefTRIAXone (ROCEPHIN)  IV 1 g (08/18/20 1144)  . heparin 1,500 Units/hr (08/18/20 [id number])  . vancomycin Stopped (08/17/20 2331)   PRN Meds:.sodium chloride, sodium chloride, acetaminophen **OR** acetaminophen, hydrALAZINE, HYDROmorphone (DILAUDID) injection, labetalol, levalbuterol, morphine injection, ondansetron **OR** ondansetron (ZOFRAN) IV, oxyCODONE, polyethylene glycol, sodium chloride flush, sodium chloride flush      Subjective:   Gregery Naina Byrer was seen and examined today.  BP now controlled.  No complaints.  Pain is controlled with current regimen.  No fevers or chills.   No acute events overnight.  Objective:   Vitals:   08/18/20 0800 08/18/20 1111 08/18/20 1126 08/18/20 1512  BP:  (!) 182/78  134/63  Pulse: 76 83 81   Resp:  16    Temp:  98.2 F (36.8 C)    TempSrc:  Oral    SpO2: 91% 98% (!) 87%   Weight:      Height:        Intake/Output Summary (Last 24 hours) at 08/18/2020 1645 Last data  filed at 08/18/2020 [id number] Gross per 24 hour  Intake 448.47 ml  Output --  Net 448.47 ml     Wt Readings from Last 3 Encounters:  08/18/20 47.1 kg  04/23/20 40 kg  12/20/17 48.5 kg   Physical Exam  General: Patient is not in any distress.  Patient is awake and alert.    Cardiovascular: S1 S2   Respiratory: CTAB  Gastrointestinal: Soft, nontender, nondistended, NBS  Ext: no pedal edema bilaterally.  Left foot is wrapped. Neuro: Awake and alert.  Patient moves all extremities.    Data Reviewed:  I have personally reviewed following labs and imaging studies  Micro Results Recent Results (from the past 240 hour(s))  Blood culture (routine x 2)     Status: None   Collection Time: 08/10/20 11:53 PM   Specimen: BLOOD RIGHT FOREARM  Result Value Ref Range Status   Specimen Description BLOOD RIGHT FOREARM  Final   Special Requests   Final    BOTTLES DRAWN AEROBIC AND ANAEROBIC Blood Culture adequate volume   Culture   Final    NO GROWTH 5 DAYS Performed at [facility], [address] [place], [place] [address]    Report Status 08/16/2020 FINAL  Final  Blood culture (routine x 2)     Status: None   Collection Time: 08/11/20 12:00 AM   Specimen: BLOOD RIGHT ARM  Result Value Ref Range Status   Specimen Description BLOOD RIGHT ARM  Final   Special Requests   Final    BOTTLES DRAWN AEROBIC AND ANAEROBIC Blood Culture results may not be optimal due to an excessive volume of blood received in culture bottles   Culture   Final    NO GROWTH 5 DAYS Performed at [facility], [address] [place], [place] [address]    Report Status 08/16/2020 FINAL  Final  Resp Panel by RT-PCR (Flu A&B, Covid) Nasopharyngeal Swab     Status: None   Collection Time: 08/11/20  1:07 AM   Specimen: Nasopharyngeal Swab; Nasopharyngeal(NP) swabs in vial transport medium  Result Value Ref Range Status   SARS Coronavirus 2 by RT PCR NEGATIVE  NEGATIVE Final    Comment: (NOTE) SARS-CoV-2 target nucleic  acids are NOT DETECTED.  The SARS-CoV-2 RNA is generally detectable in upper respiratory specimens during the acute phase of infection. The lowest concentration of SARS-CoV-2 viral copies this assay can detect is 138 copies/mL. A negative result does not preclude SARS-Cov-2 infection and should not be used as the sole basis for treatment or other patient management decisions. A negative result may occur with  improper specimen collection/handling, submission of specimen other than nasopharyngeal swab, presence of viral mutation(s) within the areas targeted by this assay, and inadequate number of viral copies(<138 copies/mL). A negative result must be combined with clinical observations, patient history, and epidemiological information. The expected result is Negative.  Fact Sheet for Patients:  BloggerCourse.com  Fact Sheet for Healthcare Providers:  SeriousBroker.it  This test is no t yet approved or cleared by the [place] FDA and  has been authorized for detection and/or diagnosis of SARS-CoV-2 by FDA under an Emergency Use Authorization (EUA). This EUA will remain  in effect (meaning this test can be used) for the duration of the COVID-19 declaration under Section 564(b)(1) of the Act, 21 U.S.C.section 360bbb-3(b)(1), unless the authorization is terminated  or revoked sooner.       Influenza A by PCR NEGATIVE NEGATIVE Final   Influenza B by PCR NEGATIVE NEGATIVE Final    Comment: (NOTE) The Xpert Xpress SARS-CoV-2/FLU/RSV plus assay is intended as an aid in the diagnosis of influenza from Nasopharyngeal swab specimens and should not be used as a sole basis for treatment. Nasal washings and aspirates are unacceptable for Xpert Xpress SARS-CoV-2/FLU/RSV testing.  Fact Sheet for Patients: BloggerCourse.com  Fact Sheet for Healthcare Providers: SeriousBroker.it  This  test is not yet approved or cleared by the [place] FDA and has been authorized for detection and/or diagnosis of SARS-CoV-2 by FDA under an Emergency Use Authorization (EUA). This EUA will remain in effect (meaning this test can be used) for the duration of the COVID-19 declaration under Section 564(b)(1) of the Act, 21 U.S.C. section 360bbb-3(b)(1), unless the authorization is terminated or revoked.  Performed at [facility], [address] [place], [place] [address]   MRSA PCR Screening     Status: None   Collection Time: 08/11/20  9:12 AM   Specimen: Nasal Mucosa; Nasopharyngeal  Result Value Ref Range Status   MRSA by PCR NEGATIVE NEGATIVE Final    Comment:        The GeneXpert MRSA Assay (FDA approved for NASAL specimens only), is one component of a comprehensive MRSA colonization surveillance program. It is not intended to diagnose MRSA infection nor to guide or monitor treatment for MRSA infections. Performed at [facility], [address] [place], [place] [address]   Surgical PCR screen     Status: None   Collection Time: 08/16/20  7:43 AM   Specimen: Nasal Mucosa; Nasal Swab  Result Value Ref Range Status   MRSA, PCR NEGATIVE NEGATIVE Final   Staphylococcus aureus NEGATIVE NEGATIVE Final    Comment: (NOTE) The Xpert SA Assay (FDA approved for NASAL specimens in patients 24 years of age and older), is one component of a comprehensive surveillance program. It is not intended to diagnose infection nor to guide or monitor treatment. Performed at [facility], [address], [place], [place] [address]     Radiology Reports CT Angio Chest PE W and/or Wo Contrast  Result Date: 08/11/2020 CLINICAL DATA:  Pulmonary embolus suspected. Swelling left  lower extremity. Injured for months. EXAM: CT ANGIOGRAPHY CHEST WITH CONTRAST TECHNIQUE: Multidetector CT imaging of the chest was performed using the standard protocol during bolus administration of intravenous  contrast. Multiplanar CT image reconstructions and MIPs were obtained to evaluate the vascular anatomy. CONTRAST:  OMNIPAQUE IOHEXOL 350 MG/ML SOLN COMPARISON:  Chest x-ray 08/10/2020 FINDINGS: Cardiovascular: Satisfactory opacification of the pulmonary arteries to the segmental level. No evidence of pulmonary embolism. The main pulmonary artery is normal in caliber. Mild atherosclerotic plaque of the thoracic aorta. severe left anterior descending left circumflex as well as at least mild right main coronary artery calcifications. Mediastinum/Nodes: No enlarged mediastinal, hilar, or axillary lymph nodes. Thyroid gland, trachea, and esophagus demonstrate no significant findings. Lungs/Pleura: Centrilobular emphysematous changes. Bilateral lower lobe subsegmental atelectasis. Limited evaluation for pulmonary nodularity due to motion artifact. No pulmonary mass. No focal consolidation. No pleural effusion. No pneumothorax. Upper Abdomen: Status post cholecystectomy. Atherosclerotic plaque. No acute abnormality. Musculoskeletal: No chest wall abnormality. Likely bone island of the left posterior eighth rib. No suspicious lytic or blastic osseous lesions. No acute displaced fracture. Review of the MIP images confirms the above findings. IMPRESSION: 1. No central or segmental pulmonary embolus. Limited evaluation at the subsegmental level due to motion artifact. 2. No acute intrapulmonary abnormality. 3. Aortic Atherosclerosis (ICD10-I70.0) including 3 vessel mild to severe coronary artery calcification. 4.  Emphysema (ICD10-J43.9). Electronically Signed   By: Tish Frederickson M.D.   On: 08/11/2020 04:06   PERIPHERAL VASCULAR CATHETERIZATION  Result Date: 08/16/2020 Procedure: Abdominal aortogram with bilateral lower extremity runoff, second order catheterization left external iliac artery Preoperative diagnosis: Gangrene right first toe Postoperative diagnosis: Same Anesthesia: Local Operative findings: 1.   Severe bilateral popliteal and tibial artery occlusive disease one-vessel diseased peroneal runoff bilaterally Operative details: After pain informed consent, patient was removed with PV lab.  The patient was placed in supine position angio table.  Both groins were prepped and draped in usual sterile fashion.  Local anesthesia was notewriter of the right common femoral artery.  Ultrasound was used to identify the right common femoral artery and femoral bifurcation.  An introducer needle was used to cannulate the right common femoral artery and an 035 Bentson wire advanced up the abdominal aorta under fluoroscopic guidance.  A 5 French sheath placed over the guidewire in the right common femoral artery.  5 French pigtail catheter was then placed over the guidewire in the abdominal aorta.  Abdominal aortogram was obtained in AP projection.  Left and right renal arteries were patent.  Infrarenal abdominal aorta is patent.  Left and right common external and internal iliac arteries are all patent.  Next the pigtail catheter was pulled down distal to the aortic bifurcation and bilateral oblique views of the pelvis were performed.  This confirmed the above findings.  At this point bilateral lower extremity runoff views were obtained through the pigtail catheter. In the left lower extremity, the left common femoral profunda femoris is patent.  Left superficial femoral artery is patent.  The above-knee popliteal artery is patent but severely diseased with multiple segments of 50 to 80% stenosis.  The below-knee popliteal artery is occluded.  The anterior tibial artery is occluded.  The posterior tibial artery is occluded.  The peroneal artery does reconstitute via collaterals in the distal third of the leg and fills the foot. In the right lower extremity, there are similar findings to the left. At this point to further define the tibial disease in the left leg the  5 French pigtail catheter was removed over guidewire and  exchanged for a 5 [place] crossover catheter.  This was used to selectively catheterize the left common iliac artery.  The Bentson wire was then advanced through the crossover catheter down into the distal left common femoral artery.  The 5 [place] crossover catheter was swapped out for a 5 [place] straight catheter and additional lower extremity views were obtained on the left side.  This confirmed the above findings. At this point the 5 French straight catheter was removed and additional views were obtained of the right lower extremity through the sheath.  This confirmed the above findings as well. At this point the procedure was concluded.  The patient tolerated procedure well and there were no complications.  Patient was taken to the holding area in stable condition. Operative management: The patient will be scheduled for a left superficial femoral to peroneal artery bypass with amputation of left first toe next Tuesday, August 20, 2020.  She will have bilateral lower extremity vein mapping performed later today to see if she has reasonable conduit to go to her peroneal artery.  She can resume her heparin at 6 PM today. Fabienne Bruns, MD [facility] Office: [phone]  US ARTERIAL ABI (SCREENING LOWER EXTREMITY)  Result Date: 08/12/2020 CLINICAL DATA:  Left first toe gangrene.  History of hypertension. EXAM: NONINVASIVE PHYSIOLOGIC VASCULAR STUDY OF BILATERAL LOWER EXTREMITIES TECHNIQUE: Evaluation of both lower extremities were performed at rest, including calculation of ankle-brachial indices with single level Doppler, pressure and pulse volume recording. COMPARISON:  None. FINDINGS: Right ABI:  0.65 Left ABI:  0.51 Right Lower Extremity: Monophasic posterior tibial and dorsalis pedis waveforms. Left Lower Extremity: Monophasic posterior tibial and dorsalis pedis waveforms. 0.5-0.79 Moderate PAD IMPRESSION: Evidence of moderate peripheral vascular disease in both lower  extremities with resting ankle-brachial indices of 0.65 on the right and 0.51 on the left. Distal waveforms are monophasic bilaterally. Consider further evaluation with CT angiography of the abdominal aorta with bilateral iliofemoral runoff for further characterization of arterial occlusive disease. Electronically Signed   By: Irish Lack M.D.   On: 08/12/2020 11:05   DG Chest Portable 1 View  Result Date: 08/10/2020 CLINICAL DATA:  Hypoxia EXAM: PORTABLE CHEST 1 VIEW COMPARISON:  04/19/2020 FINDINGS: The lungs are hyperinflated with diffuse interstitial prominence. No focal airspace consolidation or pulmonary edema. No pleural effusion or pneumothorax. Normal cardiomediastinal contours. IMPRESSION: COPD without acute airspace disease. Electronically Signed   By: Deatra Robinson M.D.   On: 08/10/2020 23:58   DG Foot Complete Left  Result Date: 08/10/2020 CLINICAL DATA:  Left great toe swelling. EXAM: LEFT FOOT - COMPLETE 3+ VIEW COMPARISON:  None. FINDINGS: There is no evidence of fracture or dislocation. There is diffuse osteopenia with mild degenerative changes seen along the dorsal aspect of the proximal to mid left foot. Marked severity soft tissue swelling is seen throughout the left great toe. IMPRESSION: Marked severity soft tissue swelling throughout the left great toe without evidence of an acute osseous abnormality. Electronically Signed   By: Aram Candela M.D.   On: 08/10/2020 22:10   VAS [place] LOWER EXTREMITY SAPHENOUS VEIN MAPPING  Result Date: 08/18/2020 LOWER EXTREMITY VEIN MAPPING Indications:  PAD Risk Factors: Hypertension.  Comparison Study: No prior studies. Performing Technologist: Chanda Busing RVT  Examination Guidelines: A complete evaluation includes B-mode imaging, spectral Doppler, color Doppler, and power Doppler as needed of all accessible portions of each vessel. Bilateral testing is considered an  integral part of a complete examination. Limited examinations for  reoccurring indications may be performed as noted. +---------------+-----------+----------------------+---------------+-----------+   RT Diameter  RT Findings         GSV            LT Diameter  LT Findings      (cm)                                            (cm)                  +---------------+-----------+----------------------+---------------+-----------+      0.49                     Saphenofemoral         0.43                                                   Junction                                  +---------------+-----------+----------------------+---------------+-----------+      0.28                     Proximal thigh         0.34       branching  +---------------+-----------+----------------------+---------------+-----------+      0.31                       Mid thigh            0.22       branching  +---------------+-----------+----------------------+---------------+-----------+      0.25       branching      Distal thigh          0.26                  +---------------+-----------+----------------------+---------------+-----------+      0.19                          Knee              0.20       branching  +---------------+-----------+----------------------+---------------+-----------+      0.08       branching       Prox calf            0.16                  +---------------+-----------+----------------------+---------------+-----------+      0.08       branching        Mid calf            0.19                  +---------------+-----------+----------------------+---------------+-----------+      0.19       branching      Distal calf           0.20       branching  +---------------+-----------+----------------------+---------------+-----------+ Diagnosing physician: Fabienne Bruns MD Electronically signed by Fabienne Bruns MD on 08/18/2020 at 11:21:22 AM.  Final    ECHOCARDIOGRAM COMPLETE  Result Date: 08/12/2020    ECHOCARDIOGRAM  REPORT   Patient Name:   ADYA WIRZ Date of Exam: 08/12/2020 Medical Rec #:  [id number]        Height:       62.0 in Accession #:    [id number]       Weight:       98.5 lb Date of Birth:  02-04-1956        BSA:          1.415 m Patient Age:    65 years         BP:           171/85 mmHg Patient Gender: F                HR:           88 bpm. [facility] Procedure: 2D Echo, Cardiac Doppler and Color Doppler Indications:    Abnormal ECG R94.31  History:        Patient has prior history of Echocardiogram examinations, most                 recent 04/19/2020. CHF, Previous Myocardial Infarction, COPD;                 Risk Factors:Hypertension. Tobacco abuse.  Sonographer:    Celesta Gentile RCS Referring Phys: [id number] PRATIK D [facility] IMPRESSIONS  1. Hypokinesis of the distal anteroseptal distal anterolateral wall; akinesis of the apex. . Left ventricular ejection fraction, by estimation, is 55 to 60%. The left ventricle has normal function. Left ventricular diastolic parameters are consistent with Grade II diastolic dysfunction (pseudonormalization).  2. Right ventricular systolic function is normal. The right ventricular size is normal. Mildly increased right ventricular wall thickness. There is moderately elevated pulmonary artery systolic pressure.  3. Left atrial size was severely dilated.  4. MR is predominantly cnetral, though some is directed posterior into LA. . Moderate to severe mitral valve regurgitation.  5. Tricuspid valve regurgitation is moderate.  6. The aortic valve is tricuspid. Aortic valve regurgitation is mild. Mild aortic valve sclerosis is present, with no evidence of aortic valve stenosis.  7. The inferior vena cava is dilated in size with <50% respiratory variability, suggesting right atrial pressure of 15 mmHg. FINDINGS  Left Ventricle: Hypokinesis of the distal anteroseptal distal anterolateral wall; akinesis of the apex. Left ventricular ejection fraction, by estimation, is 55 to  60%. The left ventricle has normal function. The left ventricular internal cavity size was normal in size. There is no left ventricular hypertrophy. Left ventricular diastolic parameters are consistent with Grade II diastolic dysfunction (pseudonormalization). Right Ventricle: The right ventricular size is normal. Mildly increased right ventricular wall thickness. Right ventricular systolic function is normal. There is moderately elevated pulmonary artery systolic pressure. The tricuspid regurgitant velocity is 3.27 m/s, and with an assumed right atrial pressure of 8 mmHg, the estimated right ventricular systolic pressure is 50.8 mmHg. Left Atrium: Left atrial size was severely dilated. Right Atrium: Right atrial size was normal in size. Pericardium: There is no evidence of pericardial effusion. Mitral Valve: MR is predominantly cnetral, though some is directed posterior into LA. There is mild thickening of the mitral valve leaflet(s). Moderate to severe mitral valve regurgitation. Tricuspid Valve: The tricuspid valve is normal in structure. Tricuspid valve regurgitation is moderate. Aortic Valve: The aortic valve is tricuspid. Aortic valve regurgitation is mild. Mild aortic valve sclerosis is  present, with no evidence of aortic valve stenosis. Pulmonic Valve: The pulmonic valve was not well visualized. Aorta: The aortic root is normal in size and structure. Venous: The inferior vena cava is dilated in size with less than 50% respiratory variability, suggesting right atrial pressure of 15 mmHg.  LEFT VENTRICLE PLAX 2D LVIDd:         4.00 cm  Diastology LVIDs:         2.60 cm  LV e' medial:    5.98 cm/s LV PW:         1.10 cm  LV E/e' medial:  20.7 LV IVS:        1.10 cm  LV e' lateral:   8.49 cm/s LVOT diam:     1.90 cm  LV E/e' lateral: 14.6 LV SV:         63 LV SV Index:   44 LVOT Area:     2.84 cm  RIGHT VENTRICLE RV S prime:     11.70 cm/s TAPSE (M-mode): 2.0 cm LEFT ATRIUM             Index       RIGHT ATRIUM            Index LA diam:        3.10 cm 2.19 cm/m  RA Area:     15.70 cm LA Vol (A2C):   61.4 ml 43.40 ml/m RA Volume:   40.00 ml  28.27 ml/m LA Vol (A4C):   66.5 ml 47.00 ml/m LA Biplane Vol: 64.1 ml 45.31 ml/m  AORTIC VALVE LVOT Vmax:   117.00 cm/s LVOT Vmean:  78.300 cm/s LVOT VTI:    0.222 m  AORTA Ao Root diam: 2.70 cm MITRAL VALVE                 TRICUSPID VALVE MV Area (PHT): 3.46 cm      TR Peak grad:   42.8 mmHg MV Decel Time: 219 msec      TR Vmax:        327.00 cm/s MR Peak grad:    136.4 mmHg MR Mean grad:    86.0 mmHg   SHUNTS MR Vmax:         584.00 cm/s Systemic VTI:  0.22 m MR Vmean:        437.0 cm/s  Systemic Diam: 1.90 cm MR PISA:         2.26 cm MR PISA Eff ROA: 9 mm MR PISA Radius:  0.60 cm MV E velocity: 124.00 cm/s MV A velocity: 81.00 cm/s MV E/A ratio:  1.53 Dietrich Pates MD Electronically signed by Dietrich Pates MD Signature Date/Time: 08/12/2020/7:26:41 PM    Final    VAS US RENAL ARTERY DUPLEX  Result Date: 08/15/2020 ABDOMINAL VISCERAL Indications: Hypertensive emergency. High Risk Factors: Hypertension, coronary artery disease. Performing Technologist: Marilynne Halsted RDMS, RVT  Examination Guidelines: A complete evaluation includes B-mode imaging, spectral Doppler, color Doppler, and power Doppler as needed of all accessible portions of each vessel. Bilateral testing is considered an integral part of a complete examination. Limited examinations for reoccurring indications may be performed as noted.  Duplex Findings: +--------------------+--------+--------+------+--------+ Mesenteric          PSV cm/sEDV cm/sPlaqueComments +--------------------+--------+--------+------+--------+ Aorta Prox             83                          +--------------------+--------+--------+------+--------+ Celiac Artery  Origin  192                          +--------------------+--------+--------+------+--------+ SMA Proximal          178                           +--------------------+--------+--------+------+--------+    +------------------+--------+--------+-------+ Right Renal ArteryPSV cm/sEDV cm/sComment +------------------+--------+--------+-------+ Origin              205      35           +------------------+--------+--------+-------+ Proximal            116      17           +------------------+--------+--------+-------+ Mid                  96      17           +------------------+--------+--------+-------+ Distal               74      17           +------------------+--------+--------+-------+ +-----------------+--------+--------+-------+ Left Renal ArteryPSV cm/sEDV cm/sComment +-----------------+--------+--------+-------+ Origin             111      25           +-----------------+--------+--------+-------+ Proximal           112      28           +-----------------+--------+--------+-------+ Mid                114      29           +-----------------+--------+--------+-------+ Distal             118      19           +-----------------+--------+--------+-------+ +------------+--------+--------+----+-----------+--------+--------+----+ Right KidneyPSV cm/sEDV cm/sRI  Left KidneyPSV cm/sEDV cm/sRI   +------------+--------+--------+----+-----------+--------+--------+----+ Upper Pole  24      8       0.71Upper Pole 32      9       0.71 +------------+--------+--------+----+-----------+--------+--------+----+ Mid         31      9       0.        23      7       0.69 +------------+--------+--------+----+-----------+--------+--------+----+ Lower Pole  20      6       0.72Lower Pole 32      9       0.74 +------------+--------+--------+----+-----------+--------+--------+----+ Hilar       30      9       0.70Hilar      34      10      0.70 +------------+--------+--------+----+-----------+--------+--------+----+ +------------------+-----+------------------+-----+ Right  Kidney           Left Kidney             +------------------+-----+------------------+-----+ RAR                    RAR                     +------------------+-----+------------------+-----+ RAR (manual)      2.4  RAR (manual)      1.3   +------------------+-----+------------------+-----+ Cortex  Cortex                  +------------------+-----+------------------+-----+ Cortex thickness       Corex thickness         +------------------+-----+------------------+-----+ Kidney length (cm)11.66Kidney length (cm)10.94 +------------------+-----+------------------+-----+  Summary: Renal:  Right: 1-59% stenosis of the right renal artery. Abnormal right        Resistive Index. Normal size right kidney. Left:  No evidence of left renal artery stenosis. Abnormal left        Resisitve Index. Normal size of left kidney.  *See table(s) above for measurements and observations.  Diagnosing physician: Lemar Livings MD  Electronically signed by Lemar Livings MD on 08/15/2020 at 6:01:20 PM.    Final     Lab Data:  CBC: Recent Labs  Lab 08/14/20 0343 08/15/20 0219 08/16/20 0251 08/17/20 0041 08/18/20 0205  WBC 11.3* 11.3* 9.7 8.6 9.8  HGB 8.7* 8.0* 8.4* 8.2* 8.3*  HCT 27.2* 25.5* 26.1* 25.2* 25.4*  MCV 91.9 93.8 92.2 90.6 91.7  PLT 169 167 172 193 208   Basic Metabolic Panel: Recent Labs  Lab 08/12/20 0802 08/13/20 0219 08/14/20 0343 08/16/20 0251 08/17/20 0041 08/18/20 0205  NA 144 143 141 140 139 139  K 3.2* 3.4* 3.9 3.6 3.0* 3.3*  CL 109 108 109 109 112* 109  CO2 23 25 23 22  20* 20*  GLUCOSE 110* 115* 103* 101* 96 119*  BUN 18 19 17 14 11 11   CREATININE 0.47 0.61 0.76 0.80 0.71 0.76  CALCIUM 9.2 9.4 9.2 9.1 8.8* 8.8*  MG 1.7 1.7 2.1 1.9  --  1.9   GFR: Estimated Creatinine Clearance: 52.8 mL/min (by C-G formula based on SCr of 0.76 mg/dL). Liver Function Tests: No results for input(s): AST, ALT, ALKPHOS, BILITOT, PROT, ALBUMIN in the last 168  hours. No results for input(s): LIPASE, AMYLASE in the last 168 hours. No results for input(s): AMMONIA in the last 168 hours. Coagulation Profile: No results for input(s): INR, PROTIME in the last 168 hours. Cardiac Enzymes: No results for input(s): CKTOTAL, CKMB, CKMBINDEX, TROPONINI in the last 168 hours. BNP (last 3 results) No results for input(s): PROBNP in the last 8760 hours. HbA1C: No results for input(s): HGBA1C in the last 72 hours. CBG: No results for input(s): GLUCAP in the last 168 hours. Lipid Profile: No results for input(s): CHOL, HDL, LDLCALC, TRIG, CHOLHDL, LDLDIRECT in the last 72 hours. Thyroid Function Tests: No results for input(s): TSH, T4TOTAL, FREET4, T3FREE, THYROIDAB in the last 72 hours. Anemia Panel: No results for input(s): VITAMINB12, FOLATE, FERRITIN, TIBC, IRON, RETICCTPCT in the last 72 hours. Urine analysis: No results found for: COLORURINE, APPEARANCEUR, LABSPEC, PHURINE, GLUCOSEU, HGBUR, BILIRUBINUR, KETONESUR, PROTEINUR, UROBILINOGEN, NITRITE, LEUKOCYTESUR   M.D. [facility] 08/18/2020, 4:45 PM  Available via Epic secure chat 7am-7pm After 7 pm, please refer to night coverage provider listed on amion.

## 2020-08-19 LAB — CBC
HCT: 26.4 % — ABNORMAL LOW (ref 36.0–46.0)
Hemoglobin: 8.4 g/dL — ABNORMAL LOW (ref 12.0–15.0)
MCH: 29.2 pg (ref 26.0–34.0)
MCHC: 31.8 g/dL (ref 30.0–36.0)
MCV: 91.7 fL (ref 80.0–100.0)
Platelets: 207 10*3/uL (ref 150–400)
RBC: 2.88 MIL/uL — ABNORMAL LOW (ref 3.87–5.11)
RDW: 15.9 % — ABNORMAL HIGH (ref 11.5–15.5)
WBC: 9.2 10*3/uL (ref 4.0–10.5)
nRBC: 0 % (ref 0.0–0.2)

## 2020-08-19 LAB — BASIC METABOLIC PANEL
Anion gap: 8 (ref 5–15)
BUN: 10 mg/dL (ref 8–23)
CO2: 19 mmol/L — ABNORMAL LOW (ref 22–32)
Calcium: 8.9 mg/dL (ref 8.9–10.3)
Chloride: 113 mmol/L — ABNORMAL HIGH (ref 98–111)
Creatinine, Ser: 0.84 mg/dL (ref 0.44–1.00)
GFR, Estimated: >60 mL/min (ref >60)
Glucose, Bld: 102 mg/dL — ABNORMAL HIGH (ref 70–99)
Potassium: 3.3 mmol/L — ABNORMAL LOW (ref 3.5–5.1)
Sodium: 140 mmol/L (ref 135–145)

## 2020-08-19 LAB — HEPARIN LEVEL (UNFRACTIONATED): Heparin Unfractionated: 0.47 IU/mL (ref 0.30–0.70)

## 2020-08-19 MED ORDER — POTASSIUM CHLORIDE 20 MEQ PO PACK
40.0000 meq | PACK | Freq: Once | ORAL | Status: AC
  Administered 2020-08-19: 40 meq via ORAL
  Filled 2020-08-19: qty 2

## 2020-08-19 NOTE — Progress Notes (Signed)
ANTICOAGULATION CONSULT NOTE   Pharmacy Consult for heparin  Indication: atrial fibrillation  No Known Allergies  Patient Measurements: Height: 5\' 2"  (157.5 cm) Weight: 46.7 kg (102 lb 15.3 oz) IBW/kg (Calculated) : 50.1 Heparin Dosing Weight: HEPARIN DW (KG): 45.4   Vital Signs: Temp: 98.4 F (36.9 C) (03/21 0337) Temp Source: Oral (03/21 0337) BP: 165/82 (03/21 0548) Pulse Rate: 77 (03/21 0337)  Labs: Recent Labs    08/17/20 0041 08/17/20 0747 08/18/20 0205 08/19/20 0452  HGB 8.2*  --  8.3* 8.4*  HCT 25.2*  --  25.4* 26.4*  PLT 193  --  208 207  HEPARINUNFRC 0.23* 0.37 0.49 0.47  CREATININE 0.71  --  0.76 0.84    Estimated Creatinine Clearance: 49.9 mL/min (by C-G formula based on SCr of 0.84 mg/dL).   Assessment: 65 y.o. female with Afib on heparin. She is noted with PAD s/p agniogram and plans are for bypass with amputation of left first toe on 3/22.   Heparin level therapeutic at 0.47 on 1500 units/hr. Hgb 8.4(stable) and plt wnl.   Goal of Therapy:  Heparin level 0.3-0.7 units/ml Monitor platelets by anticoagulation protocol: Yes   Plan:  Continue IV heparin 1500 units/hr Daily heparin level, CBC  4/22, PharmD Clinical Pharmacist **Pharmacist phone directory can now be found on [email or web address] ([email or web address]).  Listed under [facility].

## 2020-08-19 NOTE — Anesthesia Preprocedure Evaluation (Addendum)
Anesthesia Evaluation  Patient identified by MRN, date of birth, ID band Patient awake    Reviewed: Allergy & Precautions, NPO status , Patient's Chart, lab work & pertinent test results  Airway Mallampati: II  TM Distance: >3 FB Neck ROM: Full    Dental  (+) Poor Dentition, Chipped   Pulmonary COPD,  COPD inhaler, Current Smoker,    Pulmonary exam normal        Cardiovascular hypertension, Pt. on medications + Peripheral Vascular Disease, +CHF and + DVT   Rhythm:Regular Rate:Normal     Neuro/Psych    GI/Hepatic negative GI ROS, Neg liver ROS,   Endo/Other  negative endocrine ROS  Renal/GU negative Renal ROS  negative genitourinary   Musculoskeletal negative musculoskeletal ROS (+)   Abdominal (+)  Abdomen: soft. Bowel sounds: normal.  Peds  Hematology negative hematology ROS (+)   Anesthesia Other Findings   Reproductive/Obstetrics                            Anesthesia Physical Anesthesia Plan  ASA: III  Anesthesia Plan: General   Post-op Pain Management:    Induction: Intravenous  PONV Risk Score and Plan: 2 and Ondansetron, Dexamethasone, Midazolam and Treatment may vary due to age or medical condition  Airway Management Planned: Mask and Oral ETT  Additional Equipment: Arterial line  Intra-op Plan:   Post-operative Plan: Extubation in OR  Informed Consent:   Plan Discussed with:   Anesthesia Plan Comments: (ECHO 03/22: 1. Hypokinesis of the distal anteroseptal distal anterolateral wall;  akinesis of the apex. . Left ventricular ejection fraction, by estimation,  is 55 to 60%. The left ventricle has normal function. Left ventricular  diastolic parameters are consistent  with Grade II diastolic dysfunction (pseudonormalization).  2. Right ventricular systolic function is normal. The right ventricular  size is normal. Mildly increased right ventricular wall  thickness. There  is moderately elevated pulmonary artery systolic pressure.  3. Left atrial size was severely dilated.  4. MR is predominantly cnetral, though some is directed posterior into  LA. . Moderate to severe mitral valve regurgitation.  5. Tricuspid valve regurgitation is moderate.  6. The aortic valve is tricuspid. Aortic valve regurgitation is mild.  Mild aortic valve sclerosis is present, with no evidence of aortic valve  stenosis.  7. The inferior vena cava is dilated in size with <50% respiratory  variability, suggesting right atrial pressure of 15 mmHg.  Lab Results      Component                Value               Date                      WBC                      9.2                 08/19/2020                HGB                      8.4 (L)             08/19/2020                HCT  26.4 (L)            08/19/2020                MCV                      91.7                08/19/2020                PLT                      207                 08/19/2020           Lab Results      Component                Value               Date                      NA                       140                 08/19/2020                K                        3.3 (L)             08/19/2020                CO2                      19 (L)              08/19/2020                GLUCOSE                  102 (H)             08/19/2020                BUN                      10                  08/19/2020                CREATININE               0.84                08/19/2020                CALCIUM                  8.9                 08/19/2020                GFRNONAA                 >60                 08/19/2020  GFRAA                    >60                 12/20/2017          )        Anesthesia Quick Evaluation

## 2020-08-19 NOTE — Progress Notes (Signed)
PROGRESS NOTE    Sarah Bonilla  KGU:[id number] DOB: 1955-10-05 DOA: 08/10/2020 PCP: Patient, No Pcp Per   Brief Narrative: This  65 year old female with history of poorly controlled hypertension due to medication and dietary noncompliance, tobacco abuse with COPD, chronic diastolic CHF and chronic left lower extremity DVT who presented to the [facility] ED with complaints of left leg swelling over the last 1 month which started after she stubbedher left great toe.Patient was admitted with hypertensive crisis and new onset atrial flutter. Cardiology consulted.Shewasalso noted to have left great toe gangrene with ABI demonstrating moderate PAD. [facility] Vascular surgery was consulted, started on vancomycin. 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 Per vascular surgery, patient will be scheduled for left superficial femoral to peroneal artery bypass with amputation of left first toe on Tuesday, 3/22  Assessment & Plan:   Principal Problem:   Acute on chronic diastolic congestive heart failure (HCC) Active Problems:   Hypertensive emergency   Ulcer of great toe, left, with unspecified severity (HCC)   Dry gangrene (HCC)   Atrial flutter (HCC)   Left great toe pain secondary to cellulitis /dry gangrene in the setting of moderate PAD: -Patient presented with dry gangrene of left great toe. 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   Scheduled surgery on Tuesday, 08/20/2020.  Active Problems:  Hypertensive emergency -Off the Cardene drip.  Cardiology following. -Continue Cardizem, increased to 360 mg daily, Continue  Imdur, hydralazine, lisinopril, Coreg, spironolactone -Renal ultrasound showed 1-59% stenosis of right renal artery, no evidence of left renal artery stenosis 08/17/2020: Blood pressure control is improving.  Significant hypokalemia.  Rule out primary hyperaldosteronism (screening may impaired as patient is currently may be impaired due to the patient is currently on it). 08/18/2020: Blood pressure still not controlled.  Follow results of plasma aldosterone concentration and plasma renin activity.   Increase Aldactone from 25 Mg p.o. once daily to 50 Mg p.o. once daily.  Paroxysmal,  Atrial flutter (HCC), new onset: -Rate controlled, continue Cardizem, Coreg - Continue Heparin gtt   Normocytic anemia, possibly anemia of chronic disease - baseline hemoglobin ~8 -FOBT negative, H&H improving, continue IV heparin drip  08/19/2020: Hemoglobin is stable.  Hypokalemia Replacement in progress. Check labs in am.  Chronic left lower extremity DVT -Currently on heparin drip while vascular interventions are planned. -Need to be more compliant with oral anticoagulation, start NOAC after interventions are completed.  COPD with nicotine abuse -Currently no wheezing, stable, continue as needed bronchodilators -Counseled on nicotine cessation.    DVT prophylaxis: Heparin  Code Status: Full code. Family Communication: No family at bed side. Disposition Plan:   Status is: Inpatient  Remains inpatient appropriate because:Inpatient level of care appropriate due to severity of illness   Dispo: The patient is from: Home              Anticipated d/c is to: Home              Patient currently is not medically stable to d/c.   Difficult to place patient No  Consultants:  Vascular  surgery  Procedures: Antimicrobials:   Anti-infectives (From admission, onward)   Start     Dose/Rate Route Frequency Ordered Stop   08/17/20 2300  vancomycin (VANCOREADY) IVPB 1000 mg/200 mL        1,000 mg 200 mL/hr over 60 Minutes Intravenous Every 24 hours 08/17/20 2155     08/13/20 1400  metroNIDAZOLE (FLAGYL) tablet 500 mg        500 mg Oral Every 8 hours 08/13/20 1026     08/13/20 1115  cefTRIAXone (ROCEPHIN) 1 g in sodium chloride 0.9 % 100 mL IVPB        1 g 200 mL/hr over 30 Minutes Intravenous Every 24 hours 08/13/20 1026     08/11/20 2200  vancomycin (VANCOREADY) IVPB 750 mg/150 mL  Status:  Discontinued        750 mg 150 mL/hr over 60 Minutes Intravenous Every 24 hours 08/11/20 0019 08/17/20 2155   08/11/20 0015  vancomycin (VANCOCIN) IVPB 1000 mg/200 mL premix        1,000 mg 200 mL/hr over 60 Minutes Intravenous  Once 08/11/20 0012 08/11/20 0306   08/10/20 2345  cefTRIAXone (ROCEPHIN) 1 g in sodium chloride 0.9 % 100 mL IVPB        1 g 200 mL/hr over 30 Minutes Intravenous  Once 08/10/20 2340 08/11/20 0100      Subjective: Patient was seen and examined at bedside.  Overnight events noted.  Patient denies any chest pain shortness of breath.  Left great toe appears black and dry.  Denies any pain.  Objective: Vitals:   08/18/20 2357 08/19/20 0337 08/19/20 0548 08/19/20 1225  BP: (!) 155/86 (!) 157/89 (!) 165/82 (!) 144/67  Pulse: 82 77  73  Resp: Temp: 98 F (36.7 C) 98.4 F (36.9 C)  98.2 F (36.8 C)  TempSrc: Oral Oral  Oral  SpO2: 93% 92%  94%  Weight:  46.7 kg    Height:        Intake/Output Summary (Last 24 hours) at 08/19/2020 1716 Last data filed at 08/19/2020 0824 Gross per 24 hour  Intake 643.44 ml  Output 300 ml  Net 343.44 ml   Filed Weights   08/17/20 0507 08/18/20 0523 08/19/20 0337  Weight: 46.8 kg 47.1 kg 46.7 kg    Examination:  General exam: Appears calm and comfortable not in any acute distress Respiratory system:  Clear to auscultation. Respiratory effort normal. Cardiovascular system: S1 & S2 heard, RRR. No JVD, murmurs, rubs, gallops or clicks. No pedal edema. Gastrointestinal system: Abdomen is nondistended, soft and nontender. No organomegaly or masses felt. Normal bowel sounds heard. Central nervous system: Alert and oriented. No focal neurological deficits. Extremities: Symmetric 5 x 5 power.  Left great toe gangrene. Skin: No rashes, lesions or ulcers Psychiatry: Judgement and insight appear normal. Mood & affect appropriate.     Data Reviewed: I have personally reviewed following labs and imaging studies  CBC: Recent Labs  Lab 08/15/20 0219 08/16/20 0251 08/17/20 0041 08/18/20 0205 08/19/20 0452  WBC 11.3* 9.7 8.6 9.8 9.2  HGB 8.0* 8.4* 8.2* 8.3* 8.4*  HCT 25.5* 26.1* 25.2* 25.4* 26.4*  MCV 93.8 92.2 90.6 91.7 91.7  PLT 167 172 193 208 207   Basic Metabolic Panel: Recent Labs  Lab 08/13/20 0219 08/14/20 0343 08/16/20 0251 08/17/20 0041 08/18/20 0205 08/19/20 0452  NA 143 141 140 139 139 140  K 3.4* 3.9 3.6 3.0* 3.3* 3.3*  CL 108 109  109 112* 109 113*  CO2 25 23 22  20* 20* 19*  GLUCOSE 115* 103* 101* 96 119* 102*  BUN 19 17 14 11 11 10   CREATININE 0.61 0.76 0.80 0.71 0.76 0.84  CALCIUM 9.4 9.2 9.1 8.8* 8.8* 8.9  MG 1.7 2.1 1.9  --  1.9  --    GFR: Estimated Creatinine Clearance: 49.9 mL/min (by C-G formula based on SCr of 0.84 mg/dL). Liver Function Tests: No results for input(s): AST, ALT, ALKPHOS, BILITOT, PROT, ALBUMIN in the last 168 hours. No results for input(s): LIPASE, AMYLASE in the last 168 hours. No results for input(s): AMMONIA in the last 168 hours. Coagulation Profile: No results for input(s): INR, PROTIME in the last 168 hours. Cardiac Enzymes: No results for input(s): CKTOTAL, CKMB, CKMBINDEX, TROPONINI in the last 168 hours. BNP (last 3 results) No results for input(s): PROBNP in the last 8760 hours. HbA1C: No results for input(s): HGBA1C in the  last 72 hours. CBG: No results for input(s): GLUCAP in the last 168 hours. Lipid Profile: No results for input(s): CHOL, HDL, LDLCALC, TRIG, CHOLHDL, LDLDIRECT in the last 72 hours. Thyroid Function Tests: No results for input(s): TSH, T4TOTAL, FREET4, T3FREE, THYROIDAB in the last 72 hours. Anemia Panel: No results for input(s): VITAMINB12, FOLATE, FERRITIN, TIBC, IRON, RETICCTPCT in the last 72 hours. Sepsis Labs: No results for input(s): PROCALCITON, LATICACIDVEN in the last 168 hours.  Recent Results (from the past 240 hour(s))  Blood culture (routine x 2)     Status: None   Collection Time: 08/10/20 11:53 PM   Specimen: BLOOD RIGHT FOREARM  Result Value Ref Range Status   Specimen Description BLOOD RIGHT FOREARM  Final   Special Requests   Final    BOTTLES DRAWN AEROBIC AND ANAEROBIC Blood Culture adequate volume   Culture   Final    NO GROWTH 5 DAYS Performed at [facility], [address] [place], [address] [place]    Report Status 08/16/2020 FINAL  Final  Blood culture (routine x 2)     Status: None   Collection Time: 08/11/20 12:00 AM   Specimen: BLOOD RIGHT ARM  Result Value Ref Range Status   Specimen Description BLOOD RIGHT ARM  Final   Special Requests   Final    BOTTLES DRAWN AEROBIC AND ANAEROBIC Blood Culture results may not be optimal due to an excessive volume of blood received in culture bottles   Culture   Final    NO GROWTH 5 DAYS Performed at [facility], [address], [place], [address] [place]    Report Status 08/16/2020 FINAL  Final  Resp Panel by RT-PCR (Flu A&B, Covid) Nasopharyngeal Swab     Status: None   Collection Time: 08/11/20  1:07 AM   Specimen: Nasopharyngeal Swab; Nasopharyngeal(NP) swabs in vial transport medium  Result Value Ref Range Status   SARS Coronavirus 2 by RT PCR NEGATIVE NEGATIVE Final    Comment: (NOTE) SARS-CoV-2 target nucleic acids are NOT DETECTED.  The SARS-CoV-2 RNA is generally detectable in upper  respiratory specimens during the acute phase of infection. The lowest concentration of SARS-CoV-2 viral copies this assay can detect is 138 copies/mL. A negative result does not preclude SARS-Cov-2 infection and should not be used as the sole basis for treatment or other patient management decisions. A negative result may occur with  improper specimen collection/handling, submission of specimen other than nasopharyngeal swab, presence of viral mutation(s) within the areas targeted by this assay, and inadequate number of viral  copies(<138 copies/mL). A negative result must be combined with clinical observations, patient history, and epidemiological information. The expected result is Negative.  Fact Sheet for Patients:  BloggerCourse.com  Fact Sheet for Healthcare Providers:  SeriousBroker.it  This test is no t yet approved or cleared by the [place] FDA and  has been authorized for detection and/or diagnosis of SARS-CoV-2 by FDA under an Emergency Use Authorization (EUA). This EUA will remain  in effect (meaning this test can be used) for the duration of the COVID-19 declaration under Section 564(b)(1) of the Act, 21 U.S.C.section 360bbb-3(b)(1), unless the authorization is terminated  or revoked sooner.       Influenza A by PCR NEGATIVE NEGATIVE Final   Influenza B by PCR NEGATIVE NEGATIVE Final    Comment: (NOTE) The Xpert Xpress SARS-CoV-2/FLU/RSV plus assay is intended as an aid in the diagnosis of influenza from Nasopharyngeal swab specimens and should not be used as a sole basis for treatment. Nasal washings and aspirates are unacceptable for Xpert Xpress SARS-CoV-2/FLU/RSV testing.  Fact Sheet for Patients: BloggerCourse.com  Fact Sheet for Healthcare Providers: SeriousBroker.it  This test is not yet approved or cleared by the [place] FDA and has been  authorized for detection and/or diagnosis of SARS-CoV-2 by FDA under an Emergency Use Authorization (EUA). This EUA will remain in effect (meaning this test can be used) for the duration of the COVID-19 declaration under Section 564(b)(1) of the Act, 21 U.S.C. section 360bbb-3(b)(1), unless the authorization is terminated or revoked.  Performed at [facility], [address], [place], [place] [address]   MRSA PCR Screening     Status: None   Collection Time: 08/11/20  9:12 AM   Specimen: Nasal Mucosa; Nasopharyngeal  Result Value Ref Range Status   MRSA by PCR NEGATIVE NEGATIVE Final    Comment:        The GeneXpert MRSA Assay (FDA approved for NASAL specimens only), is one component of a comprehensive MRSA colonization surveillance program. It is not intended to diagnose MRSA infection nor to guide or monitor treatment for MRSA infections. Performed at [facility], [address], [place], [place] [address]   Surgical PCR screen     Status: None   Collection Time: 08/16/20  7:43 AM   Specimen: Nasal Mucosa; Nasal Swab  Result Value Ref Range Status   MRSA, PCR NEGATIVE NEGATIVE Final   Staphylococcus aureus NEGATIVE NEGATIVE Final    Comment: (NOTE) The Xpert SA Assay (FDA approved for NASAL specimens in patients 51 years of age and older), is one component of a comprehensive surveillance program. It is not intended to diagnose infection nor to guide or monitor treatment. Performed at [facility], [address], [place], [place] [address]     Radiology Studies: No results found.  Scheduled Meds: . carvedilol  25 mg Oral BID WC  . diltiazem  360 mg Oral Daily  . docusate sodium  100 mg Oral BID  . hydrALAZINE  100 mg Oral Q8H  . isosorbide mononitrate  60 mg Oral Daily  . lisinopril  40 mg Oral Daily  . metroNIDAZOLE  500 mg Oral Q8H  . mupirocin ointment  1 application Nasal BID  . pantoprazole  40 mg Oral Daily  . sodium chloride flush  3 mL Intravenous  Q12H  . sodium chloride flush  3 mL Intravenous Q12H  . spironolactone  50 mg Oral Daily   Continuous Infusions: . sodium chloride Stopped (08/11/20 2249)  . sodium  chloride    . cefTRIAXone (ROCEPHIN)  IV 1 g (08/19/20 1011)  . heparin 1,500 Units/hr (08/19/20 0540)  . vancomycin Stopped (08/18/20 2303)     LOS: 8 days    Time spent: 35 mins    Yecenia Dalgleish, MD [facility]   If 7PM-7AM, please contact night-coverage

## 2020-08-20 ENCOUNTER — Inpatient Hospital Stay (HOSPITAL_COMMUNITY): Payer: Self-pay | Admitting: Anesthesiology

## 2020-08-20 ENCOUNTER — Encounter (HOSPITAL_COMMUNITY): Payer: Self-pay | Admitting: Internal Medicine

## 2020-08-20 ENCOUNTER — Inpatient Hospital Stay (HOSPITAL_COMMUNITY): Admission: RE | Admit: 2020-08-20 | Payer: Self-pay | Source: Ambulatory Visit | Admitting: Vascular Surgery

## 2020-08-20 ENCOUNTER — Encounter (HOSPITAL_COMMUNITY)
Admission: EM | Disposition: A | Discharge status: Home / Self Care | Payer: Self-pay | Source: Home / Self Care | Attending: Internal Medicine

## 2020-08-20 HISTORY — PX: VEIN HARVEST: SHX6363

## 2020-08-20 HISTORY — PX: AMPUTATION: SHX166

## 2020-08-20 HISTORY — PX: BYPASS GRAFT FEMORAL-PERONEAL: SHX5762

## 2020-08-20 LAB — CBC
HCT: 25.5 % — ABNORMAL LOW (ref 36.0–46.0)
Hemoglobin: 8.5 g/dL — ABNORMAL LOW (ref 12.0–15.0)
MCH: 30.2 pg (ref 26.0–34.0)
MCHC: 33.3 g/dL (ref 30.0–36.0)
MCV: 90.7 fL (ref 80.0–100.0)
Platelets: 208 10*3/uL (ref 150–400)
RBC: 2.81 MIL/uL — ABNORMAL LOW (ref 3.87–5.11)
RDW: 15.9 % — ABNORMAL HIGH (ref 11.5–15.5)
WBC: 8.1 10*3/uL (ref 4.0–10.5)
nRBC: 0 % (ref 0.0–0.2)

## 2020-08-20 LAB — BASIC METABOLIC PANEL
Anion gap: 8 (ref 5–15)
BUN: 11 mg/dL (ref 8–23)
CO2: 19 mmol/L — ABNORMAL LOW (ref 22–32)
Calcium: 8.8 mg/dL — ABNORMAL LOW (ref 8.9–10.3)
Chloride: 113 mmol/L — ABNORMAL HIGH (ref 98–111)
Creatinine, Ser: 0.71 mg/dL (ref 0.44–1.00)
GFR, Estimated: >60 mL/min (ref >60)
Glucose, Bld: 91 mg/dL (ref 70–99)
Potassium: 3.3 mmol/L — ABNORMAL LOW (ref 3.5–5.1)
Sodium: 140 mmol/L (ref 135–145)

## 2020-08-20 LAB — PHOSPHORUS: Phosphorus: 3.6 mg/dL (ref 2.5–4.6)

## 2020-08-20 LAB — MAGNESIUM: Magnesium: 1.8 mg/dL (ref 1.7–2.4)

## 2020-08-20 LAB — HEPARIN LEVEL (UNFRACTIONATED): Heparin Unfractionated: 0.45 IU/mL (ref 0.30–0.70)

## 2020-08-20 SURGERY — AMPUTATION DIGIT
Anesthesia: General | Site: Leg Upper | Laterality: Left

## 2020-08-20 MED ORDER — FENTANYL CITRATE (PF) 100 MCG/2ML IJ SOLN
INTRAMUSCULAR | Status: DC | PRN
  Administered 2020-08-20: 150 ug via INTRAVENOUS

## 2020-08-20 MED ORDER — ACETAMINOPHEN 10 MG/ML IV SOLN: 1000.0000 mg | Freq: Once | INTRAVENOUS | Status: DC | PRN

## 2020-08-20 MED ORDER — OXYCODONE HCL 5 MG PO TABS: 5.0000 mg | ORAL_TABLET | Freq: Once | ORAL | Status: DC | PRN

## 2020-08-20 MED ORDER — PAPAVERINE HCL 30 MG/ML IJ SOLN
INTRAMUSCULAR | Status: AC
  Filled 2020-08-20: qty 2

## 2020-08-20 MED ORDER — LABETALOL HCL 5 MG/ML IV SOLN
10.0000 mg | INTRAVENOUS | Status: DC | PRN
  Administered 2020-08-20: 10 mg via INTRAVENOUS

## 2020-08-20 MED ORDER — SUGAMMADEX SODIUM 200 MG/2ML IV SOLN
INTRAVENOUS | Status: DC | PRN
  Administered 2020-08-20: 200 mg via INTRAVENOUS

## 2020-08-20 MED ORDER — ONDANSETRON HCL 4 MG/2ML IJ SOLN
INTRAMUSCULAR | Status: DC | PRN
  Administered 2020-08-20: 4 mg via INTRAVENOUS

## 2020-08-20 MED ORDER — 0.9 % SODIUM CHLORIDE (POUR BTL) OPTIME
TOPICAL | Status: DC | PRN
  Administered 2020-08-20: 1000 mL

## 2020-08-20 MED ORDER — OXYCODONE HCL 5 MG/5ML PO SOLN: 5.0000 mg | Freq: Once | ORAL | Status: DC | PRN

## 2020-08-20 MED ORDER — CLEVIDIPINE BUTYRATE 0.5 MG/ML IV EMUL
1.0000 mg/h | INTRAVENOUS | Status: DC
  Filled 2020-08-20: qty 50

## 2020-08-20 MED ORDER — DEXMEDETOMIDINE (PRECEDEX) IN NS 20 MCG/5ML (4 MCG/ML) IV SYRINGE
PREFILLED_SYRINGE | INTRAVENOUS | Status: DC | PRN
  Administered 2020-08-20: 6 ug via INTRAVENOUS
  Administered 2020-08-20: 10 ug via INTRAVENOUS

## 2020-08-20 MED ORDER — DEXAMETHASONE SODIUM PHOSPHATE 10 MG/ML IJ SOLN
INTRAMUSCULAR | Status: DC | PRN
  Administered 2020-08-20: 8 mg via INTRAVENOUS

## 2020-08-20 MED ORDER — PROMETHAZINE HCL 25 MG/ML IJ SOLN: 6.2500 mg | INTRAMUSCULAR | Status: DC | PRN

## 2020-08-20 MED ORDER — ALBUTEROL SULFATE HFA 108 (90 BASE) MCG/ACT IN AERS
INHALATION_SPRAY | RESPIRATORY_TRACT | Status: DC | PRN
  Administered 2020-08-20: 10 via RESPIRATORY_TRACT

## 2020-08-20 MED ORDER — SODIUM CHLORIDE 0.9 % IV SOLN
INTRAVENOUS | Status: DC | PRN
  Administered 2020-08-20: 500 mL

## 2020-08-20 MED ORDER — LACTATED RINGERS IV SOLN: INTRAVENOUS | Status: DC | PRN

## 2020-08-20 MED ORDER — PROPOFOL 10 MG/ML IV BOLUS
INTRAVENOUS | Status: DC | PRN
  Administered 2020-08-20: 120 mg via INTRAVENOUS
  Administered 2020-08-20: 30 mg via INTRAVENOUS
  Administered 2020-08-20 (×2): 20 mg via INTRAVENOUS

## 2020-08-20 MED ORDER — HEPARIN (PORCINE) 25000 UT/250ML-% IV SOLN
1500.0000 [IU]/h | INTRAVENOUS | Status: DC
  Administered 2020-08-20: 1500 [IU]/h via INTRAVENOUS
  Filled 2020-08-20 (×2): qty 250

## 2020-08-20 MED ORDER — THROMBIN (RECOMBINANT) 20000 UNITS EX SOLR
CUTANEOUS | Status: AC
  Filled 2020-08-20: qty 20000

## 2020-08-20 MED ORDER — LIDOCAINE 2% (20 MG/ML) 5 ML SYRINGE
INTRAMUSCULAR | Status: DC | PRN
  Administered 2020-08-20: 50 mg via INTRAVENOUS

## 2020-08-20 MED ORDER — FENTANYL CITRATE (PF) 100 MCG/2ML IJ SOLN: 25.0000 ug | INTRAMUSCULAR | Status: DC | PRN

## 2020-08-20 MED ORDER — ROCURONIUM BROMIDE 10 MG/ML (PF) SYRINGE
PREFILLED_SYRINGE | INTRAVENOUS | Status: DC | PRN
  Administered 2020-08-20 (×2): 50 mg via INTRAVENOUS

## 2020-08-20 MED ORDER — POTASSIUM CHLORIDE 20 MEQ PO PACK: 40.0000 meq | PACK | Freq: Once | ORAL | Status: DC

## 2020-08-20 SURGICAL SUPPLY — 66 items
ADH SKN CLS APL DERMABOND .7 (GAUZE/BANDAGES/DRESSINGS) ×2
AGENT HMST SPONGE THK3/8 (HEMOSTASIS)
BANDAGE ESMARK 6X9 LF (GAUZE/BANDAGES/DRESSINGS) IMPLANT
BNDG CMPR 9X6 STRL LF SNTH (GAUZE/BANDAGES/DRESSINGS)
BNDG ELASTIC 4X5.8 VLCR STR LF (GAUZE/BANDAGES/DRESSINGS) ×3 IMPLANT
BNDG ESMARK 6X9 LF (GAUZE/BANDAGES/DRESSINGS)
BNDG GAUZE ELAST 4 BULKY (GAUZE/BANDAGES/DRESSINGS) ×3 IMPLANT
CANISTER SUCT 3000ML PPV (MISCELLANEOUS) ×3 IMPLANT
CANNULA VESSEL 3MM 2 BLNT TIP (CANNULA) ×6 IMPLANT
CLIP VESOCCLUDE MED 24/CT (CLIP) ×3 IMPLANT
CLIP VESOCCLUDE SM WIDE 24/CT (CLIP) ×3 IMPLANT
COVER PROBE W GEL 5X96 (DRAPES) ×1 IMPLANT
COVER SURGICAL LIGHT HANDLE (MISCELLANEOUS) ×4 IMPLANT
COVER WAND RF STERILE (DRAPES) ×2 IMPLANT
CUFF TOURN SGL QUICK 24 (TOURNIQUET CUFF)
CUFF TOURN SGL QUICK 34 (TOURNIQUET CUFF)
CUFF TOURN SGL QUICK 42 (TOURNIQUET CUFF) IMPLANT
CUFF TRNQT CYL 24X4X16.5-23 (TOURNIQUET CUFF) IMPLANT
CUFF TRNQT CYL 34X4.125X (TOURNIQUET CUFF) IMPLANT
DERMABOND ADVANCED (GAUZE/BANDAGES/DRESSINGS) ×1
DERMABOND ADVANCED .7 DNX12 (GAUZE/BANDAGES/DRESSINGS) ×2 IMPLANT
DRAIN HEMOVAC 1/8 X 5 (WOUND CARE) IMPLANT
DRAPE EXTREMITY T 121X128X90 (DISPOSABLE) ×2 IMPLANT
DRAPE HALF SHEET 40X57 (DRAPES) ×3 IMPLANT
DRAPE X-RAY CASS 24X20 (DRAPES) IMPLANT
DRSG EMULSION OIL 3X3 NADH (GAUZE/BANDAGES/DRESSINGS) ×3 IMPLANT
ELECT REM PT RETURN 9FT ADLT (ELECTROSURGICAL) ×3
ELECTRODE REM PT RTRN 9FT ADLT (ELECTROSURGICAL) ×2 IMPLANT
EVACUATOR SILICONE 100CC (DRAIN) IMPLANT
GAUZE 4X4 16PLY RFD (DISPOSABLE) ×1 IMPLANT
GAUZE SPONGE 4X4 12PLY STRL (GAUZE/BANDAGES/DRESSINGS) ×3 IMPLANT
GLOVE BIO SURGEON STRL SZ7.5 (GLOVE) ×3 IMPLANT
GLOVE SURG PR MICRO ENCORE 7.5 (GLOVE) ×3 IMPLANT
GOWN STRL REUS W/ TWL LRG LVL3 (GOWN DISPOSABLE) ×6 IMPLANT
GOWN STRL REUS W/TWL LRG LVL3 (GOWN DISPOSABLE) ×9
HEMOSTAT SPONGE AVITENE ULTRA (HEMOSTASIS) IMPLANT
KIT BASIN OR (CUSTOM PROCEDURE TRAY) ×3 IMPLANT
KIT TURNOVER KIT B (KITS) ×3 IMPLANT
NS IRRIG 1000ML POUR BTL (IV SOLUTION) ×6 IMPLANT
PACK GENERAL/GYN (CUSTOM PROCEDURE TRAY) ×3 IMPLANT
PACK PERIPHERAL VASCULAR (CUSTOM PROCEDURE TRAY) ×3 IMPLANT
PAD ARMBOARD 7.5X6 YLW CONV (MISCELLANEOUS) ×6 IMPLANT
SET COLLECT BLD 21X3/4 12 (NEEDLE) IMPLANT
SPECIMEN JAR SMALL (MISCELLANEOUS) ×3 IMPLANT
STAPLER VISISTAT 35W (STAPLE) IMPLANT
STOPCOCK 4 WAY LG BORE MALE ST (IV SETS) IMPLANT
SUT ETHILON 3 0 PS 1 (SUTURE) ×5 IMPLANT
SUT PROLENE 5 0 C 1 24 (SUTURE) ×4 IMPLANT
SUT PROLENE 6 0 CC (SUTURE) ×4 IMPLANT
SUT PROLENE 7 0 BV 1 (SUTURE) IMPLANT
SUT PROLENE 7 0 BV1 MDA (SUTURE) IMPLANT
SUT SILK 2 0 SH (SUTURE) ×3 IMPLANT
SUT SILK 3 0 (SUTURE) ×3
SUT SILK 3-0 18XBRD TIE 12 (SUTURE) IMPLANT
SUT VIC AB 2-0 SH 27 (SUTURE) ×9
SUT VIC AB 2-0 SH 27XBRD (SUTURE) ×4 IMPLANT
SUT VIC AB 3-0 SH 27 (SUTURE) ×12
SUT VIC AB 3-0 SH 27X BRD (SUTURE) ×4 IMPLANT
SUT VICRYL 4-0 PS2 18IN ABS (SUTURE) ×9 IMPLANT
TAPE UMBILICAL COTTON 1/8X30 (MISCELLANEOUS) IMPLANT
TOWEL GREEN STERILE (TOWEL DISPOSABLE) ×6 IMPLANT
TOWEL GREEN STERILE FF (TOWEL DISPOSABLE) ×3 IMPLANT
TRAY FOLEY MTR SLVR 16FR STAT (SET/KITS/TRAYS/PACK) ×3 IMPLANT
TUBING EXTENTION W/L.L. (IV SETS) IMPLANT
UNDERPAD 30X36 HEAVY ABSORB (UNDERPADS AND DIAPERS) ×3 IMPLANT
WATER STERILE IRR 1000ML POUR (IV SOLUTION) ×3 IMPLANT

## 2020-08-20 NOTE — Progress Notes (Signed)
PROGRESS NOTE    Sarah Bonilla  LKG:[id number] DOB: June 19, 1955 DOA: 08/10/2020 PCP: Patient, No Pcp Per   Brief Narrative: This  65 year old female with history of poorly controlled hypertension due to medication and dietary noncompliance, tobacco abuse with COPD, chronic diastolic CHF and chronic left lower extremity DVT who presented to the [facility] ED with complaints of left leg swelling over the last 1 month which started after she stubbedher left great toe.Patient was admitted with hypertensive crisis and new onset atrial flutter. Cardiology consulted.Shewasalso noted to have left great toe gangrene with ABI demonstrating moderate PAD. [facility]. Vascular surgery was consulted, started on vancomycin. 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 Patient underwent  Exploration of left peroneal artery, harvest of left greater saphenous vein, amputation of left first toe on, 3/22  Assessment & Plan:   Principal Problem:   Acute on chronic diastolic congestive heart failure (HCC) Active Problems:   Hypertensive emergency   Ulcer of great toe, left, with unspecified severity (HCC)   Dry gangrene (HCC)   Atrial flutter (HCC)   Left great toe pain secondary to cellulitis /dry gangrene in the setting of moderate PAD: -Patient presented with dry gangrene of left great toe. -Continue IV vancomycin, Flagyl, Rocephin -Vascular surgery consulted, underwent aortogram with b/l lower extremity runoff, showed severe bilateral popliteal and tibial artery occlusive disease, pending vein mapping  - patient underwent Exploration of left peroneal artery, harvest of left greater saphenous vein, amputation of left first toe on 3/22 - Continue pain control with  oxycodone PRN. - Vascular surgery is following.  Active Problems:  Hypertensive emergency -Off the Cardene drip.  Cardiology following. -Continue Cardizem, increased to 360 mg daily, Continue  Imdur, hydralazine, lisinopril, Coreg, spironolactone -Renal ultrasound showed 1-59% stenosis of right renal artery, no evidence of left renal artery stenosis 08/17/2020: Blood pressure control is improving.  Significant hypokalemia.  Rule out primary hyperaldosteronism (screening may impaired as patient is currently may be impaired due to the patient is currently on it). 08/18/2020: Blood pressure still not controlled.  Follow results of plasma aldosterone concentration and plasma renin activity.   Increase Aldactone from 25 Mg p.o. once daily to 50 Mg p.o. once daily. BP improved.  Paroxysmal,  Atrial flutter (HCC), new onset: -Rate controlled, continue Cardizem, Coreg - Continue Heparin gtt   Normocytic anemia, possibly anemia of chronic disease - baseline hemoglobin ~8 -FOBT negative, H&H improving, continue IV heparin drip  08/19/2020: Hemoglobin is stable.  Hypokalemia Replacement in progress. Check labs in am.  Chronic left lower extremity DVT -Currently on heparin drip while vascular interventions are planned. -Need to be more compliant with oral anticoagulation, start NOAC after interventions are completed.  COPD with nicotine abuse -Currently no wheezing, stable, continue as needed bronchodilators -Counseled on nicotine cessation.    DVT prophylaxis: Heparin gtt Code Status: Full code. Family Communication: spoke with sister at bed side. Disposition Plan:   Status is: Inpatient  Remains inpatient appropriate because:Inpatient level of care appropriate due to severity of illness   Dispo: The patient is from: Home              Anticipated d/c is to: Home              Patient currently is not medically stable to d/c.   Difficult to place patient No  Consultants:    Vascular surgery  Procedures: Antimicrobials:   Anti-infectives (From admission, onward)   Start     Dose/Rate Route Frequency Ordered Stop   08/17/20 2300  vancomycin (VANCOREADY) IVPB 1000 mg/200 mL        1,000 mg 200 mL/hr over 60 Minutes Intravenous Every 24 hours 08/17/20 2155     08/13/20 1400  metroNIDAZOLE (FLAGYL) tablet 500 mg        500 mg Oral Every 8 hours 08/13/20 1026     08/13/20 1115  cefTRIAXone (ROCEPHIN) 1 g in sodium chloride 0.9 % 100 mL IVPB        1 g 200 mL/hr over 30 Minutes Intravenous Every 24 hours 08/13/20 1026     08/11/20 2200  vancomycin (VANCOREADY) IVPB 750 mg/150 mL  Status:  Discontinued        750 mg 150 mL/hr over 60 Minutes Intravenous Every 24 hours 08/11/20 0019 08/17/20 2155   08/11/20 0015  vancomycin (VANCOCIN) IVPB 1000 mg/200 mL premix        1,000 mg 200 mL/hr over 60 Minutes Intravenous  Once 08/11/20 0012 08/11/20 0306   08/10/20 2345  cefTRIAXone (ROCEPHIN) 1 g in sodium chloride 0.9 % 100 mL IVPB        1 g 200 mL/hr over 30 Minutes Intravenous  Once 08/10/20 2340 08/11/20 0100      Subjective: Patient was seen and examined at bedside.  Overnight events noted.  Patient denies any chest pain shortness of breath.   Patient underwent left great toe amputation,  tolerated well.  She reports pain is better controlled.  Objective: Vitals:   08/20/20 1148 08/20/20 1214 08/20/20 1412 08/20/20 1500  BP: 138/73 (!) 149/73 (!) 164/76 (!) 153/58  Pulse: 74 74  84  Resp: 13 14  17   Temp: 98 F (36.7 C) 97.8 F (36.6 C)  97.9 F (36.6 C)  TempSrc:  Oral  Oral  SpO2: 98% 98%  95%  Weight:      Height:        Intake/Output Summary (Last 24 hours) at 08/20/2020 1729 Last data filed at 08/20/2020 1033 Gross per 24 hour  Intake 1601.61 ml  Output 85 ml  Net 1516.61 ml   Filed Weights   08/18/20 0523 08/19/20 0337 08/20/20 0351  Weight: 47.1 kg 46.7 kg 47.8 kg    Examination:  General exam: Appears calm and comfortable  not in any acute distress Respiratory system: Clear to auscultation. Respiratory effort normal. Cardiovascular system: S1 & S2 heard, RRR. No JVD, murmurs, rubs, gallops or clicks. No pedal edema. Gastrointestinal system: Abdomen is nondistended, soft and nontender. No organomegaly or masses felt. Normal bowel sounds heard. Central nervous system: Alert and oriented. No focal neurological deficits. Extremities: Symmetric 5 x 5 power. Amputated  Left great in dressing Skin: No rashes, lesions or ulcers Psychiatry: Judgement and insight appear normal. Mood & affect appropriate.     Data Reviewed: I have personally reviewed following labs and imaging studies  CBC: Recent Labs  Lab 08/16/20 0251 08/17/20 0041 08/18/20 0205 08/19/20 0452 08/20/20 0133  WBC 9.7 8.6 9.8 9.2 8.1  HGB 8.4* 8.2* 8.3* 8.4* 8.5*  HCT 26.1* 25.2* 25.4* 26.4* 25.5*  MCV 92.2 90.6 91.7 91.7 90.7  PLT 172 193 208 207 208   Basic Metabolic Panel: Recent Labs  Lab 08/14/20 0343 08/16/20 0251 08/17/20 0041 08/18/20 0205 08/19/20 0452 08/20/20 0133  NA 141 140 139 139 140 140  K 3.9 3.6  3.0* 3.3* 3.3* 3.3*  CL 109 109 112* 109 113* 113*  CO2 23 22 20* 20* 19* 19*  GLUCOSE 103* 101* 96 119* 102* 91  BUN 17 14 11 11 10 11   CREATININE 0.76 0.80 0.71 0.76 0.84 0.71  CALCIUM 9.2 9.1 8.8* 8.8* 8.9 8.8*  MG 2.1 1.9  --  1.9  --  1.8  PHOS  --   --   --   --   --  3.6   GFR: Estimated Creatinine Clearance: 53.6 mL/min (by C-G formula based on SCr of 0.71 mg/dL). Liver Function Tests: No results for input(s): AST, ALT, ALKPHOS, BILITOT, PROT, ALBUMIN in the last 168 hours. No results for input(s): LIPASE, AMYLASE in the last 168 hours. No results for input(s): AMMONIA in the last 168 hours. Coagulation Profile: No results for input(s): INR, PROTIME in the last 168 hours. Cardiac Enzymes: No results for input(s): CKTOTAL, CKMB, CKMBINDEX, TROPONINI in the last 168 hours. BNP (last 3 results) No results  for input(s): PROBNP in the last 8760 hours. HbA1C: No results for input(s): HGBA1C in the last 72 hours. CBG: No results for input(s): GLUCAP in the last 168 hours. Lipid Profile: No results for input(s): CHOL, HDL, LDLCALC, TRIG, CHOLHDL, LDLDIRECT in the last 72 hours. Thyroid Function Tests: No results for input(s): TSH, T4TOTAL, FREET4, T3FREE, THYROIDAB in the last 72 hours. Anemia Panel: No results for input(s): VITAMINB12, FOLATE, FERRITIN, TIBC, IRON, RETICCTPCT in the last 72 hours. Sepsis Labs: No results for input(s): PROCALCITON, LATICACIDVEN in the last 168 hours.  Recent Results (from the past 240 hour(s))  Blood culture (routine x 2)     Status: None   Collection Time: 08/10/20 11:53 PM   Specimen: BLOOD RIGHT FOREARM  Result Value Ref Range Status   Specimen Description BLOOD RIGHT FOREARM  Final   Special Requests   Final    BOTTLES DRAWN AEROBIC AND ANAEROBIC Blood Culture adequate volume   Culture   Final    NO GROWTH 5 DAYS Performed at [facility], [address] [place], [place] [address]    Report Status 08/16/2020 FINAL  Final  Blood culture (routine x 2)     Status: None   Collection Time: 08/11/20 12:00 AM   Specimen: BLOOD RIGHT ARM  Result Value Ref Range Status   Specimen Description BLOOD RIGHT ARM  Final   Special Requests   Final    BOTTLES DRAWN AEROBIC AND ANAEROBIC Blood Culture results may not be optimal due to an excessive volume of blood received in culture bottles   Culture   Final    NO GROWTH 5 DAYS Performed at [facility], [address] [place], [place] [address]    Report Status 08/16/2020 FINAL  Final  Resp Panel by RT-PCR (Flu A&B, Covid) Nasopharyngeal Swab     Status: None   Collection Time: 08/11/20  1:07 AM   Specimen: Nasopharyngeal Swab; Nasopharyngeal(NP) swabs in vial transport medium  Result Value Ref Range Status   SARS Coronavirus 2 by RT PCR NEGATIVE NEGATIVE Final    Comment: (NOTE) SARS-CoV-2 target nucleic  acids are NOT DETECTED.  The SARS-CoV-2 RNA is generally detectable in upper respiratory specimens during the acute phase of infection. The lowest concentration of SARS-CoV-2 viral copies this assay can detect is 138 copies/mL. A negative result does not preclude SARS-Cov-2 infection and should not be used as the sole basis for treatment or other patient management decisions. A negative result may occur with  improper specimen collection/handling, submission of specimen other than nasopharyngeal swab, presence of viral mutation(s) within the areas targeted by this assay, and inadequate number of viral copies(<138 copies/mL). A negative result must be combined with clinical observations, patient history, and epidemiological information. The expected result is Negative.  Fact Sheet for Patients:  BloggerCourse.com  Fact Sheet for Healthcare Providers:  SeriousBroker.it  This test is no t yet approved or cleared by the [place] FDA and  has been authorized for detection and/or diagnosis of SARS-CoV-2 by FDA under an Emergency Use Authorization (EUA). This EUA will remain  in effect (meaning this test can be used) for the duration of the COVID-19 declaration under Section 564(b)(1) of the Act, 21 U.S.C.section 360bbb-3(b)(1), unless the authorization is terminated  or revoked sooner.       Influenza A by PCR NEGATIVE NEGATIVE Final   Influenza B by PCR NEGATIVE NEGATIVE Final    Comment: (NOTE) The Xpert Xpress SARS-CoV-2/FLU/RSV plus assay is intended as an aid in the diagnosis of influenza from Nasopharyngeal swab specimens and should not be used as a sole basis for treatment. Nasal washings and aspirates are unacceptable for Xpert Xpress SARS-CoV-2/FLU/RSV testing.  Fact Sheet for Patients: BloggerCourse.com  Fact Sheet for Healthcare Providers: SeriousBroker.it  This  test is not yet approved or cleared by the [place] FDA and has been authorized for detection and/or diagnosis of SARS-CoV-2 by FDA under an Emergency Use Authorization (EUA). This EUA will remain in effect (meaning this test can be used) for the duration of the COVID-19 declaration under Section 564(b)(1) of the Act, 21 U.S.C. section 360bbb-3(b)(1), unless the authorization is terminated or revoked.  Performed at [facility], [address] [place], [place] [address]   MRSA PCR Screening     Status: None   Collection Time: 08/11/20  9:12 AM   Specimen: Nasal Mucosa; Nasopharyngeal  Result Value Ref Range Status   MRSA by PCR NEGATIVE NEGATIVE Final    Comment:        The GeneXpert MRSA Assay (FDA approved for NASAL specimens only), is one component of a comprehensive MRSA colonization surveillance program. It is not intended to diagnose MRSA infection nor to guide or monitor treatment for MRSA infections. Performed at [facility], [address] [place], [place] [address]   Surgical PCR screen     Status: None   Collection Time: 08/16/20  7:43 AM   Specimen: Nasal Mucosa; Nasal Swab  Result Value Ref Range Status   MRSA, PCR NEGATIVE NEGATIVE Final   Staphylococcus aureus NEGATIVE NEGATIVE Final    Comment: (NOTE) The Xpert SA Assay (FDA approved for NASAL specimens in patients 63 years of age and older), is one component of a comprehensive surveillance program. It is not intended to diagnose infection nor to guide or monitor treatment. Performed at [facility], [address], [place], [place] [address]     Radiology Studies: No results found.  Scheduled Meds: . carvedilol  25 mg Oral BID WC  . diltiazem  360 mg Oral Daily  . docusate sodium  100 mg Oral BID  . hydrALAZINE  100 mg Oral Q8H  . isosorbide mononitrate  60 mg Oral Daily  . lisinopril  40 mg Oral Daily  . metroNIDAZOLE  500 mg Oral Q8H  . mupirocin ointment  1 application Nasal BID  .  pantoprazole  40 mg Oral Daily  . sodium chloride flush  3 mL Intravenous Q12H  . sodium chloride flush  3 mL Intravenous Q12H  . spironolactone  50 mg Oral Daily   Continuous Infusions: . sodium chloride Stopped (08/11/20 2249)  . sodium chloride    . cefTRIAXone (ROCEPHIN)  IV 1 g (08/19/20 1011)  . heparin    . vancomycin Stopped (08/20/20 0046)     LOS: 9 days    Time spent: 25 mins    Marvene Strohm, MD [facility]   If 7PM-7AM, please contact night-coverage

## 2020-08-20 NOTE — Anesthesia Procedure Notes (Signed)
Procedure Name: Intubation Date/Time: 08/20/2020 8:03 AM Performed by: Lynnell Chad, CRNA Pre-anesthesia Checklist: Patient identified, Emergency Drugs available, Suction available and Patient being monitored Patient Re-evaluated:Patient Re-evaluated prior to induction Oxygen Delivery Method: Circle System Utilized Preoxygenation: Pre-oxygenation with 100% oxygen Induction Type: IV induction Ventilation: Mask ventilation without difficulty Laryngoscope Size: Miller and 2 Tube type: Oral Tube size: 7.0 mm Number of attempts: 1 Airway Equipment and Method: Stylet and Oral airway Placement Confirmation: ETT inserted through vocal cords under direct vision,  positive ETCO2 and breath sounds checked- equal and bilateral Secured at: 21 cm Tube secured with: Tape Dental Injury: Teeth and Oropharynx as per pre-operative assessment

## 2020-08-20 NOTE — Transfer of Care (Signed)
Immediate Anesthesia Transfer of Care Note  Patient: Sarah Bonilla  Procedure(s) Performed: LEFT FIRST TOE AMPUTATION (Left ) exploration peroneal artery saphenous vein (Left Leg Upper)  Patient Location: PACU  Anesthesia Type:General  Level of Consciousness: drowsy and pateint uncooperative  Airway & Oxygen Therapy: Patient Spontanous Breathing and Patient connected to face mask oxygen  Post-op Assessment: Report given to RN and Post -op Vital signs reviewed and stable  Post vital signs: Reviewed and stable  Last Vitals:  Vitals Value Taken Time  BP 188/98 08/20/20 1047  Temp    Pulse 80 08/20/20 1050  Resp 24 08/20/20 1050  SpO2 100 % 08/20/20 1050  Vitals shown include unvalidated device data.  Last Pain:  Vitals:   08/20/20 0445  TempSrc:   PainSc: 5       Patients Stated Pain Goal: 4 (08/20/20 0414)  Complications: No complications documented.

## 2020-08-20 NOTE — Progress Notes (Signed)
Mobility Specialist - Progress Note   08/20/20 1344  Mobility  Activity Ambulated in hall  Level of Assistance Standby assist, set-up cues, supervision of patient - no hands on  Assistive Device Front wheel walker  Distance Ambulated (ft) 200 ft  Mobility Response Tolerated well  Mobility performed by Mobility specialist  $Mobility charge 1 Mobility   Pre-mobility, 2L O2: 77 HR, 158/85 BP, 100% SpO2 During mobility, 2L O2: 85 HR, 95% SpO2 Post-mobility, RA: 77 HR, 165/104 BP, 96% SpO2  Pt able to ambulate bearing weight on L heel. Verbal cues for positioning in walker when turning. She was asx throughout. Pt to recliner after walk and left on RA. Chair alarm on, call bell in reach, and her sister in room.   Mamie Levers Mobility Specialist Mobility Specialist Phone: [phone]

## 2020-08-20 NOTE — Anesthesia Procedure Notes (Signed)
Arterial Line Insertion Start/End3/22/2022 7:10 AM Performed by: Atilano Median, DO, Dorie Rank, CRNA, CRNA  Preanesthetic checklist: patient identified Lidocaine 1% used for infiltration radial was placed Catheter size: 20 G Hand hygiene performed , maximum sterile barriers used  and Seldinger technique used Allen's test indicative of satisfactory collateral circulation Attempts: 2 Procedure performed without using ultrasound guided technique. Following insertion, dressing applied and Biopatch. Post procedure assessment: normal  Patient tolerated the procedure well with no immediate complications.

## 2020-08-20 NOTE — Interval H&P Note (Signed)
History and Physical Interval Note:  08/20/2020 7:33 AM  Sarah Bonilla  has presented today for surgery, with the diagnosis of GANGRENE, OCCLUSION OF LEFT POPLITEAL-TIBIAL ARTERY.  The various methods of treatment have been discussed with the patient and family. After consideration of risks, benefits and other options for treatment, the patient has consented to  Procedure(s): LEFT FIRST TOE AMPUTATION (Left) LEFT SUPERFICIAL FEMORAL-PERONEAL BYPASS GRAFT (Left) as a surgical intervention.  The patient's history has been reviewed, patient examined, no change in status, stable for surgery.  I have reviewed the patient's chart and labs.  Questions were answered to the patient's satisfaction.     Fabienne Bruns

## 2020-08-20 NOTE — Op Note (Signed)
Procedure: Exploration of left peroneal artery, harvest of left greater saphenous vein, amputation of left first toe  Preoperative diagnosis: Gangrene left first toe  Wrist operative diagnosis: Same  Anesthesia: General  Assistant: Wendi Maya, PA-C for assistance with exposure and expediting procedure.  Operative findings: 1.  Poor quality saphenous vein with only 4 cm usable segment  2.  Severely diseased 1 mm peroneal artery not suitable for bypass graft  Operative details: After the informed consent, the patient taken the operating.  The patient was placed in supine position operating table.  After induction general anesthesia endotracheal ovation a Foley catheter was placed.  Next the patient's left lower extremities prepped and draped in usual sterile fashion.  Ultrasound was used to identify the patient's left greater saphenous vein.  Preoperative vein mapping ultrasound had shown that the vein was probably of marginal quality.  I marked the vein in the medial aspect of the left calf.  Incision was made in this location carried onto the subcutaneous tissues down the level of vein.  It was very small and of poor quality.  I then made an additional incision in the left groin.  Incision was carried down through subcutaneous tissues down the level of the saphenous vein.  It was harvested although up to the saphenofemoral junction.  The vein was about 2-1/2 mm at this level.  It then bifurcated about 2 cm after the takeoff from the saphenofemoral junction.  I dissected out both of these branches for several centimeters and then both quickly tapered to less than 2 mm diameter.  These were both ligated and divided distally and saved as segment for possible patch.  Then turned my attention to exposing the patient's left peroneal artery.  Longitudinal incision was made on the medial aspect of the calf about 8 cm above the ankle.  The incision was carried on through subcutaneous tissues down the level  of fascia.  The muscles were divided between fascial planes all the way down to the level of the peroneal artery.  Peroneal artery was quite small about 1 mm in diameter.  It was severely diseased.  I felt that doing a bypass to this with prosthetic would probably not be very durable and would probably disrupt collaterals making the situation worse than it currently was.  At this point I closed all of these incisions with multiple layers of running 3-0 and 4-0 Vicryl subcuticular stitch in the skin.  The bypass was aborted.  Attention was then turned to the patient's left first toe.  A circumferential incision was made at the base of the left first toe carried down through subcutaneous tissues all the way to level of bone.  Bone was then transected with a bone cutter.  The bone was debrided back so that there was no tension on the skin edges.  The wound was thoroughly irrigated normal saline solution.  There was some skin edge bleeding.  The skin edges were then reapproximated using interrupted 3 oh vertical mattress and simple nylon sutures.  Patient tired procedure well and there were no complications.  The instrument sponge needle counts correct in the case.  Patient was taken the recovery room in stable condition.  Her heparin can be resumed in 6 hours.  She can be transitioned back to her oral anticoagulation tomorrow morning.  Fabienne Bruns, MD [facility] Office: [phone]

## 2020-08-20 NOTE — Progress Notes (Signed)
ANTICOAGULATION CONSULT NOTE   Pharmacy Consult for heparin  Indication: atrial fibrillation  No Known Allergies  Patient Measurements: Height: 5\' 2"  (157.5 cm) Weight: 47.8 kg (105 lb 4.8 oz) IBW/kg (Calculated) : 50.1 Heparin Dosing Weight: HEPARIN DW (KG): 45.4   Vital Signs: Temp: 97.8 F (36.6 C) (03/22 1214) Temp Source: Oral (03/22 1214) BP: 149/73 (03/22 1214) Pulse Rate: 74 (03/22 1214)  Labs: Recent Labs    08/18/20 0205 08/19/20 0452 08/20/20 0133  HGB 8.3* 8.4* 8.5*  HCT 25.4* 26.4* 25.5*  PLT 208 207 208  HEPARINUNFRC 0.49 0.47 0.45  CREATININE 0.76 0.84 0.71    Estimated Creatinine Clearance: 53.6 mL/min (by C-G formula based on SCr of 0.71 mg/dL).   Assessment: 65 y.o. female with Afib on heparin. She is noted with PAD s/p agniogram and plans are for bypass with amputation of left first toe on 3/22.   Heparin level therapeutic at 0.45 on 1500 units/hr. Hgb 8.5(stable) and plt wnl. Patient went to exploration, amputation, and vein harvest this morning. No immediate complications noted.   Per vascular to resume heparin 6h post procedure. Level has been at goal and stable, will not recheck tonight.   Goal of Therapy:  Heparin level 0.3-0.7 units/ml Monitor platelets by anticoagulation protocol: Yes   Plan:  Restart IV heparin 1500 units/hr this afternoon Daily heparin level, CBC  4/22 PharmD., BCPS Clinical Pharmacist 08/20/2020 1:31 PM

## 2020-08-21 ENCOUNTER — Other Ambulatory Visit: Payer: Self-pay | Admitting: Family Medicine

## 2020-08-21 ENCOUNTER — Encounter (HOSPITAL_COMMUNITY): Payer: Self-pay | Admitting: Vascular Surgery

## 2020-08-21 LAB — BASIC METABOLIC PANEL
Anion gap: 8 (ref 5–15)
BUN: 15 mg/dL (ref 8–23)
CO2: 17 mmol/L — ABNORMAL LOW (ref 22–32)
Calcium: 8.5 mg/dL — ABNORMAL LOW (ref 8.9–10.3)
Chloride: 111 mmol/L (ref 98–111)
Creatinine, Ser: 0.85 mg/dL (ref 0.44–1.00)
GFR, Estimated: >60 mL/min (ref >60)
Glucose, Bld: 140 mg/dL — ABNORMAL HIGH (ref 70–99)
Potassium: 3.7 mmol/L (ref 3.5–5.1)
Sodium: 136 mmol/L (ref 135–145)

## 2020-08-21 LAB — CBC
HCT: 27.5 % — ABNORMAL LOW (ref 36.0–46.0)
Hemoglobin: 8.5 g/dL — ABNORMAL LOW (ref 12.0–15.0)
MCH: 29 pg (ref 26.0–34.0)
MCHC: 30.9 g/dL (ref 30.0–36.0)
MCV: 93.9 fL (ref 80.0–100.0)
Platelets: 232 10*3/uL (ref 150–400)
RBC: 2.93 MIL/uL — ABNORMAL LOW (ref 3.87–5.11)
RDW: 15.9 % — ABNORMAL HIGH (ref 11.5–15.5)
WBC: 8.6 10*3/uL (ref 4.0–10.5)
nRBC: 0 % (ref 0.0–0.2)

## 2020-08-21 LAB — HEPARIN LEVEL (UNFRACTIONATED): Heparin Unfractionated: 0.35 IU/mL (ref 0.30–0.70)

## 2020-08-21 LAB — PHOSPHORUS: Phosphorus: 3.8 mg/dL (ref 2.5–4.6)

## 2020-08-21 LAB — MAGNESIUM: Magnesium: 1.7 mg/dL (ref 1.7–2.4)

## 2020-08-21 MED ORDER — SODIUM BICARBONATE 650 MG PO TABS
650.0000 mg | ORAL_TABLET | Freq: Two times a day (BID) | ORAL | Status: DC
  Administered 2020-08-21: 650 mg via ORAL
  Filled 2020-08-21: qty 1

## 2020-08-21 MED ORDER — APIXABAN 5 MG PO TABS: 5.0000 mg | ORAL_TABLET | Freq: Two times a day (BID) | ORAL | 2 refills | Status: DC

## 2020-08-21 MED ORDER — HYDRALAZINE HCL 100 MG PO TABS
100.0000 mg | ORAL_TABLET | Freq: Three times a day (TID) | ORAL | 1 refills | Status: AC
Start: 2020-08-21 — End: ?

## 2020-08-21 MED ORDER — ENSURE ENLIVE PO LIQD: 237.0000 mL | Freq: Three times a day (TID) | ORAL | Status: DC

## 2020-08-21 MED ORDER — ADULT MULTIVITAMIN W/MINERALS CH: 1.0000 | ORAL_TABLET | Freq: Every day | ORAL | Status: DC

## 2020-08-21 MED ORDER — APIXABAN 5 MG PO TABS
5.0000 mg | ORAL_TABLET | Freq: Two times a day (BID) | ORAL | Status: DC
  Administered 2020-08-21: 5 mg via ORAL
  Filled 2020-08-21: qty 1

## 2020-08-21 MED ORDER — ISOSORBIDE MONONITRATE ER 60 MG PO TB24
60.0000 mg | ORAL_TABLET | Freq: Every day | ORAL | 1 refills | Status: AC
Start: 2020-08-22 — End: ?

## 2020-08-21 MED ORDER — DILTIAZEM HCL ER COATED BEADS 360 MG PO CP24: 360.0000 mg | ORAL_CAPSULE | Freq: Every day | ORAL | 1 refills | Status: DC

## 2020-08-21 MED FILL — ISOSORBIDE MN ER 60 MG TAB: 60 | 30 days supply | Qty: 30 | Fill #0

## 2020-08-21 MED FILL — hydrALAZINE HCL 100 MG TABS: 100 | 30 days supply | Qty: 90 | Fill #0

## 2020-08-21 MED FILL — ELIQUIS 5 MG TABLET: 5 | 30 days supply | Qty: 60 | Fill #0

## 2020-08-21 MED FILL — DILTIAZEM 24HR ER 360 MG CA: 360 | 30 days supply | Qty: 30 | Fill #0

## 2020-08-21 NOTE — TOC Benefit Eligibility Note (Signed)
Patient Advocate Encounter  Insurance verification completed.    The patient is uninsured  Jeffrey Smith, CPhT Pharmacy [facility] Antimicrobial Stewardship Team Direct Number: [phone]  Fax: [phone]        

## 2020-08-21 NOTE — TOC Transition Note (Signed)
Transition of Care (TOC) - CM/SW Discharge Note Donn Pierini RN, BSN Transitions of Care Unit 4E- RN Case Manager See Treatment Team for direct phone #    Patient Details  Name: Sarah Bonilla MRN: [id number] Date of Birth: 20-Oct-1955  Transition of Care [facility]) CM/SW Contact:  Darrold Span, RN Phone Number: 08/21/2020, 2:13 PM   Clinical Narrative:    Noted pt has been provided [facility] info- phone # updated on AVS. Pt has been started on Eliquis, TOC pharmacy to provide first 30 days free with card as well as other meds, family to pay $34 cost for meds. Pt will need to apply for patient assistance and pharmacy has spoken with pt about this.   Per bedside RN- pt requesting RW for home, MD has placed order and call has been made to [employer] for assistance with DME need- RW to be delivered to room prior to discharge.   Final next level of care: Home/Self Care Barriers to Discharge: Barriers Resolved   Patient Goals and CMS Choice Patient states their goals for this hospitalization and ongoing recovery are:: return home   Choice offered to / list presented to : NA  Discharge Placement                  Home      Discharge Plan and Services In-house Referral: [employer] / [facility] Discharge Planning Services: Medication Assistance Post Acute Care Choice: Durable Medical Equipment          DME Arranged: Walker rolling DME Agency: [employer] Date DME Agency Contacted: 08/21/20 Time DME Agency Contacted: 1000 Representative spoke with at DME Agency: Eyvonne Mechanic HH Arranged: NA HH Agency: NA        Social Determinants of Health (SDOH) Interventions     Readmission Risk Interventions Readmission Risk Prevention Plan 08/21/2020  Transportation Screening Complete  PCP or Specialist Appt within 5-7 Days Complete  Home Care Screening Complete  Medication Review (RN CM) Complete  Some recent data might be hidden

## 2020-08-21 NOTE — Progress Notes (Signed)
Orthopedic Tech Progress Note Patient Details:  Sarah Bonilla Oct 19, 1955 [id number]  Ortho Devices Type of Ortho Device: Postop shoe/boot Ortho Device/Splint Location: LLE Ortho Device/Splint Interventions: Ordered,Application   Post Interventions Patient Tolerated: Well Instructions Provided: Care of device   [name] 08/21/2020, 11:46 AM

## 2020-08-21 NOTE — Progress Notes (Addendum)
Initial Nutrition Assessment  DOCUMENTATION CODES:  Non-severe (moderate) malnutrition in context of chronic illness  INTERVENTION:  Add Ensure Enlive po TID, each supplement provides 350 kcal and 20 grams of protein.  Add MVI with minerals daily.  NUTRITION DIAGNOSIS:  Moderate Malnutrition related to chronic illness (poorly controlled HTN, COPD, chronic CHF) as evidenced by moderate muscle depletion,moderate fat depletion.  GOAL:  Patient will meet greater than or equal to 90% of their needs  MONITOR:  PO intake,Supplement acceptance,Skin  REASON FOR ASSESSMENT:  LOS    ASSESSMENT:  64 yo female with a PMH of poorly controlled hypertension due to medication and dietary noncompliance, tobacco abuse with COPD, chronic diastolic CHF, and chronic left lower extremity DVT who presented to [facility] ED with L leg swelling after stubbing big toe and was transferred to [facility] for vascular surgery eval. 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 Pt reports getting her appetite back and wanting more baked items, not fried foods. Per Epic, eating 0-100% of meals, mostly 0-25%. She could not say what she has been eating here or at home, despite being asked several times in different ways.  Pt reports walking around on her own at home with no issues. She reports some weight loss, unable to quantify how much or over how long. Difficult to quantify weight changes d/t fluid accumulation. Some fluid accumulation may be masking depletions.  Encouraged intake of Ensure and meals to promote healing from surgery and aid in fat and muscle repletion.  Relevant Medications: colace BID, flagyl, Protonix, Na Bicarb 650 mg  BID, spironolactone, vancomycin, rocephin, oxycodone Labs: CBG 91-140 HbA1c: 5.1% (07/2020)  NUTRITION - FOCUSED PHYSICAL EXAM: Flowsheet Row Most Recent Value  Orbital Region Mild depletion  Upper Arm Region Moderate depletion  Thoracic and Lumbar Region Moderate depletion  Buccal Region Moderate depletion  Temple Region Severe depletion  Clavicle Bone Region Mild depletion  Clavicle and Acromion Bone Region Moderate depletion  Scapular Bone Region Moderate depletion  Dorsal Hand Moderate depletion  Patellar Region Moderate depletion  Anterior Thigh Region Moderate depletion  Posterior Calf Region Moderate depletion  Edema (RD Assessment) Mild  Hair Reviewed  Eyes Reviewed  [pale conjunctiva]  Mouth Reviewed  Skin Reviewed  Nails Reviewed  [pale nail beds]     Diet Order:   Diet Order            Diet - low sodium heart healthy           Diet Heart Room service appropriate? Yes with Assist; Fluid consistency: Thin  Diet effective now                EDUCATION NEEDS:  Education needs have been addressed  Skin:  Skin Assessment: Skin Integrity Issues: Skin Integrity Issues:: Incisions Incisions: L leg and L foot  Last BM:  08/19/20 - on OBR  Height:  Ht Readings from Last 1 Encounters:  08/16/20 5\' 2"  (1.575 m)   Weight:  Wt Readings from Last 1 Encounters:  08/21/20 49.5 kg   Ideal Body Weight:  50 kg  BMI:  Body mass index is 19.95 kg/m.  Estimated Nutritional Needs:  Kcal:  1500-1700 Protein:  90-105 grams Fluid:  >1.8 L  08/23/20, RD, LDN Registered Dietitian  After Hours/Weekend Pager # in [employer]

## 2020-08-21 NOTE — Discharge Instructions (Signed)
Advised to follow-up with primary care physician in 1 week. Advised to follow-up with cardiology Dr. Sharyn Lull in 1 week. Advised to follow-up with vascular surgery Dr. Darrick Penna in 2 weeks  patient blood pressure medication had been adjusted. Advised to discontinue amlodipine and continue Cardizem 360 mg daily Advised to take Eliquis 5 mg twice daily for paroxysmal A. fib.   Incision Care, Adult An incision is a cut that a doctor makes in your skin for surgery. Most times, these cuts are closed after surgery. Your cut from surgery may be closed with:  Stitches (sutures).  Staples.  Skin glue.  Skin tape (adhesive) strips. You may need to return to your doctor to have stitches or staples taken out. This may happen many days or many weeks after your surgery. You need to take good care of your cut so it does not get infected. Follow instructions from your doctor about how to care for your cut. Supplies needed:  Soap and water.  A clean hand towel.  Wound cleanser.  A clean bandage (dressing), if needed.  Cream or ointment, if told by your doctor.  Clean gauze. How to care for your cut from surgery Cleaning your cut Ask your doctor how to clean your cut. You may need to:  Use mild soap and water, or a wound cleanser.  Use a clean gauze to pat your cut dry after you clean it. Changing your bandage  Wash your hands with soap and water for at least 20 seconds before and after you change your bandage. If you cannot use soap and water, use hand sanitizer.  Change your bandage as told by your doctor.  Leave stitches, staples, skin glue, or skin tape strips in place. They may need to stay in place for 2 weeks or longer. If tape strips get loose and curl up, you may trim the loose edges. Do not remove tape strips completely unless your doctor says it is okay.  Put a cream or ointment on your cut. Do this only as told.  Cover your cut with a clean bandage.  Ask your doctor when you  can leave your cut uncovered. Checking for infection Check your cut area every day for signs of infection. Check for:  More redness, swelling, or pain.  More fluid or blood.  Warmth.  Pus or a bad smell.   Follow these instructions at home Medicines  Take over-the-counter and prescription medicines only as told by your doctor.  If you were prescribed an antibiotic medicine, cream, or ointment, use it as told by your doctor. Do not stop using the antibiotic even if your condition improves. Eating and drinking  Eat foods that have a lot of certain nutrients, such as protein, vitamin A, and vitamin C. These foods help your cut heal. ? Foods rich in protein include meat, fish, eggs, dairy, beans, and nuts. ? Foods rich in vitamin A include carrots and dark green, leafy vegetables. ? Foods rich in vitamin C include citrus fruits, tomatoes, broccoli, and peppers.  Drink enough fluid to keep your pee (urine) pale yellow. General instructions  Do not take baths, swim, use a hot tub, or put your cut underwater until your doctor approves. Ask your doctor if you may take showers. You may only be allowed to take sponge baths.  Limit movement around your cut. This helps with healing. ? Try not to strain, lift, or exercise for the first 2 weeks, or for as long as told by your doctor. ?  Return to your normal activities as told by your doctor. Ask your doctor what activities are safe for you.  Do not scratch or pick at your cut. Keep it covered as told by your doctor.  Protect your cut from the sun when you are outside for the first 6 months, or for as long as told by your doctor. Cover up the scar area or put on sunscreen that has an SPF of at least 30.  Do not use any products that contain nicotine or tobacco, such as cigarettes, e-cigarettes, and chewing tobacco. These can delay cut healing after surgery. If you need help quitting, ask your doctor.  Keep all follow-up visits as told by your  doctor. This is important.   Contact a doctor if:  You have any of these signs of infection around your cut: ? More redness, swelling, or pain. ? More fluid or blood. ? Warmth. ? Pus or a bad smell.  You have a fever.  You feel like you may vomit (nauseous).  You vomit.  You are dizzy.  Your stitches, staples, skin glue, or tape strips come undone. Get help right away if:  Your cut has a red streak coming from it.  Your cut bleeds through your bandage, and bleeding does not stop with gentle pressure.  Your cut opens up and comes apart.  Your body reacts very badly to an infection. This may include: ? A fever, chills, or feeling cold. ? Feeling mixed up, worried, or nervous. ? Very bad pain. ? Trouble breathing. ? A fast heartbeat. ? Clammy or sweaty skin. ? A rash. These symptoms may be an emergency. Do not wait to see if the symptoms will go away. Get medical help right away. Call your local emergency services (911 in the [place] Do not drive yourself to the hospital. Summary  Follow instructions from your doctor about how to care for your cut from surgery.  Wash your hands with soap and water for at least 20 seconds before and after you change your bandage. If you cannot use soap and water, use hand sanitizer.  Check your cut area every day for signs of infection.  Keep all follow-up visits as told by your doctor. This is important. This information is not intended to replace advice given to you by your health care provider. Make sure you discuss any questions you have with your health care provider. Document Revised: 03/08/2019 Document Reviewed: 03/08/2019 Elsevier Patient Education  2021 Else[employer] Inc. Toe Amputation, Care After This sheet gives you information about how to care for yourself after your procedure. Your health care provider may also give you more specific instructions. If you have problems or questions, contact your health care provider. What can I  expect after the procedure? After the procedure, it is common to have some pain. Pain usually improves within a week. Follow these instructions at home: Medicines  Take over-the-counter and prescription medicines only as told by your health care provider.  If you were prescribed an antibiotic medicine, take it as told by your health care provider. Do not stop taking the antibiotic even if you start to feel better.  Ask your health care provider if the medicine prescribed to you: ? Requires you to avoid driving or using heavy machinery. ? Can cause constipation. You may need to take actions to prevent or treat constipation, such as:  Drink enough fluid to keep your urine pale yellow.  Take over-the-counter or prescription medicines.  Eat foods that are  high in fiber, such as beans, whole grains, and fresh fruits and vegetables.  Limit foods that are high in fat and processed sugars, such as fried or sweet foods. Managing pain, stiffness, and swelling  If directed, put ice on the painful area. ? Put ice in a plastic bag. ? Place a towel between your skin and the bag. ? Leave the ice on for 20 minutes, 2-3 times a day.  Move your other toes often to reduce stiffness and swelling.  Raise (elevate) your foot above the level of your heart while you are sitting or lying down.   Incision care  Follow instructions from your health care provider about how to take care of your incision. Make sure you: ? Wash your hands with soap and water before and after you change your bandage (dressing). If soap and water are not available, use hand sanitizer. ? Change your dressing as told by your health care provider. ? Leave stitches (sutures), skin glue, or adhesive strips in place. These skin closures may need to stay in place for 2 weeks or longer. If adhesive strip edges start to loosen and curl up, you may trim the loose edges. Do not remove adhesive strips completely unless your health care  provider tells you to do that.  Check your incision area every day for signs of infection. Check for: ? More redness, swelling, or pain. ? Fluid or blood. ? Warmth. ? Pus or a bad smell.      Bathing  Do not take baths, swim, use a hot tub, or soak your foot until your health care provider approves. Ask your health care provider if you may take showers. You may only be allowed to take sponge baths.  If your dressing has been removed, you may wash your skin with warm water and soap. Activity  Rest as told by your health care provider.  Avoid sitting for a long time without moving. Get up to take short walks every 1-2 hours. This is important to improve blood flow and breathing. Ask for help if you feel weak or unsteady.  If you have been given crutches, use them as told by your health care provider.  Do exercises as told by your health care provider.  Return to your normal activities as told by your health care provider. Ask your health care provider what activities are safe for you. General instructions  Do not drive for 24 hours if you were given a sedative during your procedure.  Do not use any products that contain nicotine or tobacco, such as cigarettes, e-cigarettes, and chewing tobacco. If you need help quitting, ask your health care provider.  Ask your health care provider about wearing supportive shoes or using inserts to help with your balance when walking.  If you have diabetes, keep your blood sugar under good control and check your feet daily for sores or open areas.  Keep all follow-up visits as told by your health care provider. This is important. Contact a health care provider if:  You have signs of infection at your incision, such as: ? Redness, swelling, or pain. ? Fluid or blood. ? Warmth. ? Pus or a bad smell.  You have a fever or chills.  Your dressing is soaked with blood.  Your sutures tear or they separate.  You have numbness or tingling in  your toes or foot.  Your foot is cool or pale, or it changes color.  Your pain does not improve after you take your  medicine. Get help right away if you have:  Pain or swelling near your incision that gets worse or does not go away.  Red streaks on your skin near your toes, foot, or leg.  Pain in your calf or behind your knee.  Shortness of breath.  Chest pain. Summary  After the procedure, it is common to have some pain. Pain usually improves within a week.  If you were prescribed an antibiotic medicine, take it as told by your health care provider. Do not stop taking the antibiotic even if you start to feel better.  Change your dressing as told by your health care provider.  Contact a health care provider if you have signs of infection.  Keep all follow-up visits as told by your health care provider. This is important. This information is not intended to replace advice given to you by your health care provider. Make sure you discuss any questions you have with your health care provider. Document Revised: 09/07/2018 Document Reviewed: 05/10/2018 Elsevier Patient Education  2021 Elsevier Inc.  Information on my medicine - ELIQUIS (apixaban)  Why was Eliquis prescribed for you? Eliquis was prescribed for you to reduce the risk of a blood clot forming that can cause a stroke if you have a medical condition called atrial fibrillation (a type of irregular heartbeat).  What do You need to know about Eliquis ? Take your Eliquis TWICE DAILY - one tablet in the morning and one tablet in the evening with or without food. If you have difficulty swallowing the tablet whole please discuss with your pharmacist how to take the medication safely.  Take Eliquis exactly as prescribed by your doctor and DO NOT stop taking Eliquis without talking to the doctor who prescribed the medication.  Stopping may increase your risk of developing a stroke.  Refill your prescription before you run  out.  After discharge, you should have regular check-up appointments with your healthcare provider that is prescribing your Eliquis.  In the future your dose may need to be changed if your kidney function or weight changes by a significant amount or as you get older.  What do you do if you miss a dose? If you miss a dose, take it as soon as you remember on the same day and resume taking twice daily.  Do not take more than one dose of ELIQUIS at the same time to make up a missed dose.  Important Safety Information A possible side effect of Eliquis is bleeding. You should call your healthcare provider right away if you experience any of the following: ? Bleeding from an injury or your nose that does not stop. ? Unusual colored urine (red or dark brown) or unusual colored stools (red or black). ? Unusual bruising for unknown reasons. ? A serious fall or if you hit your head (even if there is no bleeding).  Some medicines may interact with Eliquis and might increase your risk of bleeding or clotting while on Eliquis. To help avoid this, consult your healthcare provider or pharmacist prior to using any new prescription or non-prescription medications, including herbals, vitamins, non-steroidal anti-inflammatory drugs (NSAIDs) and supplements.  This website has more information on Eliquis (apixaban): http://www.eliquis.com/eliquis/home

## 2020-08-21 NOTE — Progress Notes (Signed)
ANTICOAGULATION CONSULT NOTE   Pharmacy Consult for heparin >>apixaban Indication: atrial fibrillation  No Known Allergies  Patient Measurements: Height: 5\' 2"  (157.5 cm) Weight: 49.5 kg (109 lb 1.6 oz) IBW/kg (Calculated) : 50.1 Heparin Dosing Weight: HEPARIN DW (KG): 45.4   Vital Signs: Temp: 98.4 F (36.9 C) (03/23 0816) Temp Source: Oral (03/23 0816) BP: 181/80 (03/23 0816) Pulse Rate: 77 (03/23 0816)  Labs: Recent Labs    08/19/20 0452 08/20/20 0133 08/21/20 0114  HGB 8.4* 8.5* 8.5*  HCT 26.4* 25.5* 27.5*  PLT 207 208 232  HEPARINUNFRC 0.47 0.45 0.35  CREATININE 0.84 0.71 0.85    Estimated Creatinine Clearance: 52.3 mL/min (by C-G formula based on SCr of 0.85 mg/dL).   Assessment: 65 y.o. female with Afib on heparin, chronic LE DVTs. She is noted with PAD s/p agniogram.   Heparin level therapeutic at 0.35 on 1500 units/hr. Hgb 8.5(stable) and plt wnl.Bypass was aborted yesterday but amputation was performed.  Orders received to transition patient over to apixaban this morning.   Of note she was not on anticoagulation prior to admit for concerns of compliance and her chronic anemia.   She has no medication insurance, pharmacy is working on patient assistance, will utilize 30 day free card from transitions pharmacy.   Goal of Therapy:  Heparin level 0.3-0.7 units/ml Monitor platelets by anticoagulation protocol: Yes   Plan:  Apixaban 5mg  bid Copay is  Education provided this morning  77 PharmD., BCPS Clinical Pharmacist 08/21/2020 9:36 AM

## 2020-08-21 NOTE — Anesthesia Postprocedure Evaluation (Signed)
Anesthesia Post Note  Patient: JOSETTA WIGAL  Procedure(s) Performed: LEFT FIRST TOE AMPUTATION (Left ) exploration left peroneal artery  (Left Leg Upper) VEIN HARVEST LEFT GREATER SAPHENOUS VEIN (Left Leg Upper)     Patient location during evaluation: PACU Anesthesia Type: General Level of consciousness: awake and alert Pain management: pain level controlled Vital Signs Assessment: post-procedure vital signs reviewed and stable Respiratory status: spontaneous breathing, nonlabored ventilation, respiratory function stable and patient connected to nasal cannula oxygen Cardiovascular status: blood pressure returned to baseline and stable Postop Assessment: no apparent nausea or vomiting Anesthetic complications: no   No complications documented.  Last Vitals:  Vitals:   08/20/20 2322 08/21/20 0422  BP: (!) 161/78 (!) 177/79  Pulse: 87 85  Resp: 18 19  Temp: 36.6 C 36.9 C  SpO2: 99% 97%    Last Pain:  Vitals:   08/21/20 0633  TempSrc:   PainSc: 0-No pain                 Nelle Don Patsie Mccardle

## 2020-08-21 NOTE — Progress Notes (Signed)
Pt discharged to home with daughter.

## 2020-08-21 NOTE — Progress Notes (Addendum)
  Progress Note    08/21/2020 7:13 AM 1 Day Post-Op  Subjective:  No complaints. Voiding spontaneously. Tolerating diet   Vitals:   08/20/20 2322 08/21/20 0422  BP: (!) 161/78 (!) 177/79  Pulse: 87 85  Resp: 18 19  Temp: 97.8 F (36.6 C) 98.5 F (36.9 C)  SpO2: 99% 97%    Physical Exam: General appearance: Awake, alert in no apparent distress Cardiac: Heart rate and rhythm are regular Respirations: Nonlabored Incisions: Left groin, thigh and lower leg incisions are all well approximated without bleeding or hematoma Extremities: Left GT surgical dressing is dry and intact  CBC    Component Value Date/Time   WBC 8.6 08/21/2020 0114   RBC 2.93 (L) 08/21/2020 0114   HGB 8.5 (L) 08/21/2020 0114   HCT 27.5 (L) 08/21/2020 0114   PLT 232 08/21/2020 0114   MCV 93.9 08/21/2020 0114   MCH 29.0 08/21/2020 0114   MCHC 30.9 08/21/2020 0114   RDW 15.9 (H) 08/21/2020 0114   LYMPHSABS 1.8 04/19/2020 0255   MONOABS 0.6 04/19/2020 0255   EOSABS 0.0 04/19/2020 0255   BASOSABS 0.0 04/19/2020 0255    BMET    Component Value Date/Time   NA 136 08/21/2020 0114   K 3.7 08/21/2020 0114   CL 111 08/21/2020 0114   CO2 17 (L) 08/21/2020 0114   GLUCOSE 140 (H) 08/21/2020 0114   BUN 15 08/21/2020 0114   CREATININE 0.85 08/21/2020 0114   CALCIUM 8.5 (L) 08/21/2020 0114   GFRNONAA >60 08/21/2020 0114   GFRAA >60 12/20/2017 0915     Intake/Output Summary (Last 24 hours) at 08/21/2020 0713 Last data filed at 08/21/2020 0557 Gross per 24 hour  Intake 1285.92 ml  Output 835 ml  Net 450.92 ml    HOSPITAL MEDICATIONS Scheduled Meds: . carvedilol  25 mg Oral BID WC  . diltiazem  360 mg Oral Daily  . docusate sodium  100 mg Oral BID  . hydrALAZINE  100 mg Oral Q8H  . isosorbide mononitrate  60 mg Oral Daily  . lisinopril  40 mg Oral Daily  . metroNIDAZOLE  500 mg Oral Q8H  . mupirocin ointment  1 application Nasal BID  . pantoprazole  40 mg Oral Daily  . sodium chloride flush  3  mL Intravenous Q12H  . sodium chloride flush  3 mL Intravenous Q12H  . spironolactone  50 mg Oral Daily   Continuous Infusions: . sodium chloride Stopped (08/11/20 2249)  . sodium chloride    . cefTRIAXone (ROCEPHIN)  IV 1 g (08/19/20 1011)  . heparin 1,500 Units/hr (08/20/20 1811)  . vancomycin 1,000 mg (08/20/20 2242)   PRN Meds:.sodium chloride, sodium chloride, acetaminophen **OR** acetaminophen, HYDROmorphone (DILAUDID) injection, labetalol, levalbuterol, morphine injection, ondansetron **OR** ondansetron (ZOFRAN) IV, oxyCODONE, polyethylene glycol, sodium chloride flush, sodium chloride flush  Assessment and Plan: POD 1: Exploration of left peroneal artery, harvest of left greater saphenous vein, amputation of left first toe VSS. Afebrile. Hemoglobin stable. Ok to transition to oral anticoagulant today. Ok to discharge home today from vascular perspective. We will arrange follow-up and will get Darco shoe this morning.  -DVT prophylaxis:  On heparin infusion   Wendi Maya, PA-C [facility] [phone] 08/21/2020  7:13 AM   Agree with above.  She needs follow up with me in 3-4 weeks.  Fabienne Bruns, MD [facility] [place] Office: [phone]

## 2020-08-21 NOTE — Discharge Summary (Addendum)
Physician Discharge Summary  Sarah Bonilla ZOX:[id number] DOB: September 13, 1955 DOA: 08/10/2020  PCP: Patient, No Pcp Per  Admit date: 08/10/2020.  Discharge date: 08/21/2020 .  Admitted From: Home Disposition:  Home  Recommendations for Outpatient Follow-up:  1. Follow up with PCP in 1-2 weeks 2. Please obtain BMP/CBC in one week 3. Advised to follow-up with cardiology Dr. Sharyn Lull in 1 week. 4. Advised to follow-up with vascular surgery Dr. Darrick Penna in 2 weeks.  5. Patient's blood pressure medications had been adjusted. 6. Advised to discontinue amlodipine and continue Cardizem CD360 mg daily 7. Advised to take Eliquis 5 mg twice daily for paroxysmal A. Fib. 8. Please follow up Aldosterone/Renin ratio as there was suspicion for primary hyperaldosteronism.  Home Health: None.  Equipment/Devices: None  Discharge Condition: Stable CODE STATUS:Full code Diet recommendation: Heart Healthy   Brief [facility] Course: This 65 year old female with history ofpoorly controlled hypertension due to medication and dietary noncompliance, tobacco abuse with COPD, chronic diastolic CHF and chronic left lower extremity DVT who presented to the [facility] ED with complaints of left leg swelling for last one month which started after she stubbedher left great toe.Patient was admitted with hypertensive crisis and new onset atrial flutter. Cardiology was consulted.Shewasalso noted to have left great toe gangrene with ABI demonstrating moderate PAD. [facility].  Patient was transferred to [facility].  Vascular surgery was consulted, she was started on vancomycin, Flagyl and ceftriaxone. She underwent aortogram with bilateral lower extremity runoff on 08/16/20, which showed severe bilateral popliteal and tibial artery occlusive disease,  one-vessel diseased peroneal runoff bilaterally, pending vein mapping .   Vascular surgery  recommended and  patient underwent  Exploration of left peroneal artery, harvest of left greater saphenous vein, amputation of left first toe on, 3/22 tolerated well.  Patient has ambulated on the left heel.  Patient was placed in postoperative shoe.  Patient was cleared from vascular surgery to be discharged and follow-up outpatient in 4 weeks.  Patient does not need antibiotics.  It was clarified with vascular surgery Dr. Darrick Penna.  Patient was kept on heparin while having surgical procedures.  Heparin was transitioned to Eliquis.  Patient will follow up with Dr. Sharyn Lull.  Patient is being discharged home.  She was managed for below problems.  Discharge Diagnoses:  Principal Problem:   Acute on chronic diastolic congestive heart failure (HCC) Active Problems:   Hypertensive emergency   Ulcer of great toe, left, with unspecified severity (HCC)   Dry gangrene (HCC)   Atrial flutter (HCC)  Left great toe pain secondary to cellulitis /dry gangrene in the setting of moderate PAD: -Patient presented with dry gangrene of left great toe. -Continue IV vancomycin, Flagyl, Rocephin -Vascular surgery consulted, underwent aortogram with b/l lower extremity runoff, showed severe bilateral popliteal and tibial artery occlusive disease, pending vein mapping  - Patient underwent Exploration of left peroneal artery, harvest of left greater saphenous vein, amputation of left first toe on 3/22 - Continue pain control with oxycodone PRN. -Vascular surgery recommended postop shoe, no antibiotics after discharge and follow-up in 4 weeks.  Active Problems: Hypertensive emergency -Off the Cardene drip. Cardiology following. -Continue Cardizem, increased to 360 mg daily, Continue  Imdur, hydralazine, lisinopril, Coreg. -Renal ultrasound showed 1-59% stenosis of right renal artery, no evidence of left renal artery stenosis 08/17/2020: Blood pressure control is improving. Significant hypokalemia. Rule out primary  hyperaldosteronism (screening may impaired as patient is currently may be impaired due  to the patient is currently on it). 08/18/2020: Blood pressure still not controlled. Follow results of plasma aldosterone concentration and plasma renin activity.  Increase Aldactone from 25 Mg p.o. once daily to 50 Mg p.o. once daily. BP improved.  Paroxysmal, Atrial flutter (HCC), new onset: -Rate controlled, continue Cardizem, Coreg - Continue Heparin gtt , transitioned to Eliquis.  Normocytic anemia, possibly anemia of chronic disease - baseline hemoglobin ~8 -FOBT negative, H&H improving, continue IV heparin drip  08/19/2020: Hemoglobin is stable.  Hypokalemia > Resolved. Check labs in am.  Chronic left lower extremity DVT -Currently on heparin drip while vascular interventions are planned. -Need to be more compliant with oral anticoagulation, start NOAC after interventions are completed.  COPD with nicotine abuse -Currently no wheezing, stable, continue as needed bronchodilators -Counseled on nicotine cessation.  Discharge Instructions  Discharge Instructions    Call MD for:  persistant dizziness or light-headedness   Complete by: As directed    Call MD for:  persistant nausea and vomiting   Complete by: As directed    Call MD for:  severe uncontrolled pain   Complete by: As directed    Call MD for:  temperature >100.4   Complete by: As directed    Diet - low sodium heart healthy   Complete by: As directed    Discharge instructions   Complete by: As directed    Advised to follow-up with primary care physician in 1 week. Advised to follow-up with cardiology Dr. Sharyn Lull in 1 week. Advised to follow-up with vascular surgery Dr. Darrick Penna in 2 weeks  patient blood pressure medication had been adjusted. Advised to discontinue amlodipine and continue Cardizem 360 mg daily Advised to take Eliquis 5 mg twice daily for paroxysmal A. fib. Marland Kitchen   Discharge wound care:   Complete by: As  directed    Follow-up vascular surgery Dr. Darrick Penna.   Increase activity slowly   Complete by: As directed      Allergies as of 08/21/2020   No Known Allergies     Medication List    STOP taking these medications   amLODipine 10 MG tablet Commonly known as: NORVASC     TAKE these medications   acetaminophen 325 MG tablet Commonly known as: TYLENOL Take 650 mg by mouth every 6 (six) hours as needed for moderate pain or headache.   apixaban 5 MG Tabs tablet Commonly known as: ELIQUIS Take 1 tablet (5 mg total) by mouth 2 (two) times daily.   carvedilol 25 MG tablet Commonly known as: COREG Take 1 tablet (25 mg total) by mouth 2 (two) times daily with a meal.   diltiazem 360 MG 24 hr capsule Commonly known as: CARDIZEM CD Take 1 capsule (360 mg total) by mouth daily. Start taking on: August 22, 2020   hydrALAZINE 100 MG tablet Commonly known as: APRESOLINE Take 1 tablet (100 mg total) by mouth every 8 (eight) hours. What changed:   medication strength  how much to take   ibuprofen 200 MG tablet Commonly known as: ADVIL Take 400 mg by mouth every 6 (six) hours as needed for mild pain.   isosorbide mononitrate 60 MG 24 hr tablet Commonly known as: IMDUR Take 1 tablet (60 mg total) by mouth daily. Start taking on: August 22, 2020   lisinopril 40 MG tablet Commonly known as: ZESTRIL Take 1 tablet (40 mg total) by mouth daily.   pantoprazole 40 MG tablet Commonly known as: PROTONIX Take 1 tablet (40 mg total) by mouth  daily.            Durable Medical Equipment  (From admission, onward)         Start     Ordered   08/21/20 1105  DME Walker  Once       Question Answer Comment  Walker: With 5 Inch Wheels   Patient needs a walker to treat with the following condition Pain   Patient needs a walker to treat with the following condition Left leg pain      08/21/20 1106           Discharge Care Instructions  (From admission, onward)         Start      Ordered   08/21/20 0000  Discharge wound care:       Comments: Follow-up vascular surgery Dr. Darrick Penna.   08/21/20 1106          Follow-up Information    [facility] Follow up.   Why: Follow up for PCP. - call for physican referral assistance.  Contact information: [phone]       Sherren Kerns, MD Follow up.   Specialties: Vascular Surgery, Cardiology Why: Office will call you with appointment day and time Contact information: [address] [place] [address] [phone]        Antoine Poche, MD Follow up in 1 week(s).   Specialty: Cardiology Contact information: [address] [place] [address] [phone]        Rinaldo Cloud, MD Follow up in 1 week(s).   Specialty: Cardiology Contact information: [address] [place] [address] [phone]              No Known Allergies  Consultations:  Vascular surgery   Procedures/Studies: CT Angio Chest PE W and/or Wo Contrast  Result Date: 08/11/2020 CLINICAL DATA:  Pulmonary embolus suspected. Swelling left lower extremity. Injured for months. EXAM: CT ANGIOGRAPHY CHEST WITH CONTRAST TECHNIQUE: Multidetector CT imaging of the chest was performed using the standard protocol during bolus administration of intravenous contrast. Multiplanar CT image reconstructions and MIPs were obtained to evaluate the vascular anatomy. CONTRAST:  OMNIPAQUE IOHEXOL 350 MG/ML SOLN COMPARISON:  Chest x-ray 08/10/2020 FINDINGS: Cardiovascular: Satisfactory opacification of the pulmonary arteries to the segmental level. No evidence of pulmonary embolism. The main pulmonary artery is normal in caliber. Mild atherosclerotic plaque of the thoracic aorta. severe left anterior descending left circumflex as well as at least mild right main coronary artery calcifications. Mediastinum/Nodes: No enlarged mediastinal, hilar, or axillary lymph nodes. Thyroid gland, trachea, and esophagus  demonstrate no significant findings. Lungs/Pleura: Centrilobular emphysematous changes. Bilateral lower lobe subsegmental atelectasis. Limited evaluation for pulmonary nodularity due to motion artifact. No pulmonary mass. No focal consolidation. No pleural effusion. No pneumothorax. Upper Abdomen: Status post cholecystectomy. Atherosclerotic plaque. No acute abnormality. Musculoskeletal: No chest wall abnormality. Likely bone island of the left posterior eighth rib. No suspicious lytic or blastic osseous lesions. No acute displaced fracture. Review of the MIP images confirms the above findings. IMPRESSION: 1. No central or segmental pulmonary embolus. Limited evaluation at the subsegmental level due to motion artifact. 2. No acute intrapulmonary abnormality. 3. Aortic Atherosclerosis (ICD10-I70.0) including 3 vessel mild to severe coronary artery calcification. 4.  Emphysema (ICD10-J43.9). Electronically Signed   By: Tish Frederickson M.D.   On: 08/11/2020 04:06   PERIPHERAL VASCULAR CATHETERIZATION  Result Date: 08/16/2020 Procedure: Abdominal aortogram with bilateral lower extremity runoff, second order catheterization left external iliac artery  Preoperative diagnosis: Gangrene right first toe Postoperative diagnosis: Same Anesthesia: Local Operative findings: 1.  Severe bilateral popliteal and tibial artery occlusive disease one-vessel diseased peroneal runoff bilaterally Operative details: After pain informed consent, patient was removed with PV lab.  The patient was placed in supine position angio table.  Both groins were prepped and draped in usual sterile fashion.  Local anesthesia was notewriter of the right common femoral artery.  Ultrasound was used to identify the right common femoral artery and femoral bifurcation.  An introducer needle was used to cannulate the right common femoral artery and an 035 Bentson wire advanced up the abdominal aorta under fluoroscopic guidance.  A 5 French sheath placed  over the guidewire in the right common femoral artery.  5 French pigtail catheter was then placed over the guidewire in the abdominal aorta.  Abdominal aortogram was obtained in AP projection.  Left and right renal arteries were patent.  Infrarenal abdominal aorta is patent.  Left and right common external and internal iliac arteries are all patent.  Next the pigtail catheter was pulled down distal to the aortic bifurcation and bilateral oblique views of the pelvis were performed.  This confirmed the above findings.  At this point bilateral lower extremity runoff views were obtained through the pigtail catheter. In the left lower extremity, the left common femoral profunda femoris is patent.  Left superficial femoral artery is patent.  The above-knee popliteal artery is patent but severely diseased with multiple segments of 50 to 80% stenosis.  The below-knee popliteal artery is occluded.  The anterior tibial artery is occluded.  The posterior tibial artery is occluded.  The peroneal artery does reconstitute via collaterals in the distal third of the leg and fills the foot. In the right lower extremity, there are similar findings to the left. At this point to further define the tibial disease in the left leg the 5 French pigtail catheter was removed over guidewire and exchanged for a 5 [place] crossover catheter.  This was used to selectively catheterize the left common iliac artery.  The Bentson wire was then advanced through the crossover catheter down into the distal left common femoral artery.  The 5 [place] crossover catheter was swapped out for a 5 [place] straight catheter and additional lower extremity views were obtained on the left side.  This confirmed the above findings. At this point the 5 French straight catheter was removed and additional views were obtained of the right lower extremity through the sheath.  This confirmed the above findings as well. At this point the procedure was concluded.  The patient  tolerated procedure well and there were no complications.  Patient was taken to the holding area in stable condition. Operative management: The patient will be scheduled for a left superficial femoral to peroneal artery bypass with amputation of left first toe next Tuesday, August 20, 2020.  She will have bilateral lower extremity vein mapping performed later today to see if she has reasonable conduit to go to her peroneal artery.  She can resume her heparin at 6 PM today. Fabienne Bruns, MD [facility] Office: [phone]  US ARTERIAL ABI (SCREENING LOWER EXTREMITY)  Result Date: 08/12/2020 CLINICAL DATA:  Left first toe gangrene.  History of hypertension. EXAM: NONINVASIVE PHYSIOLOGIC VASCULAR STUDY OF BILATERAL LOWER EXTREMITIES TECHNIQUE: Evaluation of both lower extremities were performed at rest, including calculation of ankle-brachial indices with single level Doppler, pressure and pulse volume recording. COMPARISON:  None. FINDINGS: Right ABI:  0.65 Left ABI:  0.51 Right Lower Extremity: Monophasic posterior tibial and dorsalis pedis waveforms. Left Lower Extremity: Monophasic posterior tibial and dorsalis pedis waveforms. 0.5-0.79 Moderate PAD IMPRESSION: Evidence of moderate peripheral vascular disease in both lower extremities with resting ankle-brachial indices of 0.65 on the right and 0.51 on the left. Distal waveforms are monophasic bilaterally. Consider further evaluation with CT angiography of the abdominal aorta with bilateral iliofemoral runoff for further characterization of arterial occlusive disease. Electronically Signed   By: Irish Lack M.D.   On: 08/12/2020 11:05   DG Chest Portable 1 View  Result Date: 08/10/2020 CLINICAL DATA:  Hypoxia EXAM: PORTABLE CHEST 1 VIEW COMPARISON:  04/19/2020 FINDINGS: The lungs are hyperinflated with diffuse interstitial prominence. No focal airspace consolidation or pulmonary edema. No pleural effusion or  pneumothorax. Normal cardiomediastinal contours. IMPRESSION: COPD without acute airspace disease. Electronically Signed   By: Deatra Robinson M.D.   On: 08/10/2020 23:58   DG Foot Complete Left  Result Date: 08/10/2020 CLINICAL DATA:  Left great toe swelling. EXAM: LEFT FOOT - COMPLETE 3+ VIEW COMPARISON:  None. FINDINGS: There is no evidence of fracture or dislocation. There is diffuse osteopenia with mild degenerative changes seen along the dorsal aspect of the proximal to mid left foot. Marked severity soft tissue swelling is seen throughout the left great toe. IMPRESSION: Marked severity soft tissue swelling throughout the left great toe without evidence of an acute osseous abnormality. Electronically Signed   By: Aram Candela M.D.   On: 08/10/2020 22:10   VAS [place] LOWER EXTREMITY SAPHENOUS VEIN MAPPING  Result Date: 08/18/2020 LOWER EXTREMITY VEIN MAPPING Indications:  PAD Risk Factors: Hypertension.  Comparison Study: No prior studies. Performing Technologist: Chanda Busing RVT  Examination Guidelines: A complete evaluation includes B-mode imaging, spectral Doppler, color Doppler, and power Doppler as needed of all accessible portions of each vessel. Bilateral testing is considered an integral part of a complete examination. Limited examinations for reoccurring indications may be performed as noted. +---------------+-----------+----------------------+---------------+-----------+   RT Diameter  RT Findings         GSV            LT Diameter  LT Findings      (cm)                                            (cm)                  +---------------+-----------+----------------------+---------------+-----------+      0.49                     Saphenofemoral         0.43                                                   Junction                                  +---------------+-----------+----------------------+---------------+-----------+      0.28                     Proximal  thigh  0.34       branching  +---------------+-----------+----------------------+---------------+-----------+      0.31                       Mid thigh            0.22       branching  +---------------+-----------+----------------------+---------------+-----------+      0.25       branching      Distal thigh          0.26                  +---------------+-----------+----------------------+---------------+-----------+      0.19                          Knee              0.20       branching  +---------------+-----------+----------------------+---------------+-----------+      0.08       branching       Prox calf            0.16                  +---------------+-----------+----------------------+---------------+-----------+      0.08       branching        Mid calf            0.19                  +---------------+-----------+----------------------+---------------+-----------+      0.19       branching      Distal calf           0.20       branching  +---------------+-----------+----------------------+---------------+-----------+ Diagnosing physician: Fabienne Bruns MD Electronically signed by Fabienne Bruns MD on 08/18/2020 at 11:21:22 AM.    Final    ECHOCARDIOGRAM COMPLETE  Result Date: 08/12/2020    ECHOCARDIOGRAM REPORT   Patient Name:   Sarah Bonilla Date of Exam: 08/12/2020 Medical Rec #:  [id number]        Height:       62.0 in Accession #:    [id number]       Weight:       98.5 lb Date of Birth:  [birth year]        BSA:          1.415 m Patient Age:    65 years         BP:           171/85 mmHg Patient Gender: F                HR:           88 bpm. [facility] Procedure: 2D Echo, Cardiac Doppler and Color Doppler Indications:    Abnormal ECG R94.31  History:        Patient has prior history of Echocardiogram examinations, most                 recent 04/19/2020. CHF, Previous Myocardial Infarction, COPD;                 Risk  Factors:Hypertension. Tobacco abuse.  Sonographer:    Celesta Gentile RCS Referring Phys: [id number] PRATIK D [facility] IMPRESSIONS  1. Hypokinesis of the distal anteroseptal distal anterolateral wall; akinesis of the apex. . Left ventricular ejection fraction, by estimation, is 55 to 60%.  The left ventricle has normal function. Left ventricular diastolic parameters are consistent with Grade II diastolic dysfunction (pseudonormalization).  2. Right ventricular systolic function is normal. The right ventricular size is normal. Mildly increased right ventricular wall thickness. There is moderately elevated pulmonary artery systolic pressure.  3. Left atrial size was severely dilated.  4. MR is predominantly cnetral, though some is directed posterior into LA. . Moderate to severe mitral valve regurgitation.  5. Tricuspid valve regurgitation is moderate.  6. The aortic valve is tricuspid. Aortic valve regurgitation is mild. Mild aortic valve sclerosis is present, with no evidence of aortic valve stenosis.  7. The inferior vena cava is dilated in size with <50% respiratory variability, suggesting right atrial pressure of 15 mmHg. FINDINGS  Left Ventricle: Hypokinesis of the distal anteroseptal distal anterolateral wall; akinesis of the apex. Left ventricular ejection fraction, by estimation, is 55 to 60%. The left ventricle has normal function. The left ventricular internal cavity size was normal in size. There is no left ventricular hypertrophy. Left ventricular diastolic parameters are consistent with Grade II diastolic dysfunction (pseudonormalization). Right Ventricle: The right ventricular size is normal. Mildly increased right ventricular wall thickness. Right ventricular systolic function is normal. There is moderately elevated pulmonary artery systolic pressure. The tricuspid regurgitant velocity is 3.27 m/s, and with an assumed right atrial pressure of 8 mmHg, the estimated right ventricular systolic pressure is 50.8  mmHg. Left Atrium: Left atrial size was severely dilated. Right Atrium: Right atrial size was normal in size. Pericardium: There is no evidence of pericardial effusion. Mitral Valve: MR is predominantly cnetral, though some is directed posterior into LA. There is mild thickening of the mitral valve leaflet(s). Moderate to severe mitral valve regurgitation. Tricuspid Valve: The tricuspid valve is normal in structure. Tricuspid valve regurgitation is moderate. Aortic Valve: The aortic valve is tricuspid. Aortic valve regurgitation is mild. Mild aortic valve sclerosis is present, with no evidence of aortic valve stenosis. Pulmonic Valve: The pulmonic valve was not well visualized. Aorta: The aortic root is normal in size and structure. Venous: The inferior vena cava is dilated in size with less than 50% respiratory variability, suggesting right atrial pressure of 15 mmHg.  LEFT VENTRICLE PLAX 2D LVIDd:         4.00 cm  Diastology LVIDs:         2.60 cm  LV e' medial:    5.98 cm/s LV PW:         1.10 cm  LV E/e' medial:  20.7 LV IVS:        1.10 cm  LV e' lateral:   8.49 cm/s LVOT diam:     1.90 cm  LV E/e' lateral: 14.6 LV SV:         63 LV SV Index:   44 LVOT Area:     2.84 cm  RIGHT VENTRICLE RV S prime:     11.70 cm/s TAPSE (M-mode): 2.0 cm LEFT ATRIUM             Index       RIGHT ATRIUM           Index LA diam:        3.10 cm 2.19 cm/m  RA Area:     15.70 cm LA Vol (A2C):   61.4 ml 43.40 ml/m RA Volume:   40.00 ml  28.27 ml/m LA Vol (A4C):   66.5 ml 47.00 ml/m LA Biplane Vol: 64.1 ml 45.31 ml/m  AORTIC VALVE LVOT Vmax:  117.00 cm/s LVOT Vmean:  78.300 cm/s LVOT VTI:    0.222 m  AORTA Ao Root diam: 2.70 cm MITRAL VALVE                 TRICUSPID VALVE MV Area (PHT): 3.46 cm      TR Peak grad:   42.8 mmHg MV Decel Time: 219 msec      TR Vmax:        327.00 cm/s MR Peak grad:    136.4 mmHg MR Mean grad:    86.0 mmHg   SHUNTS MR Vmax:         584.00 cm/s Systemic VTI:  0.22 m MR Vmean:        437.0 cm/s   Systemic Diam: 1.90 cm MR PISA:         2.26 cm MR PISA Eff ROA: 9 mm MR PISA Radius:  0.60 cm MV E velocity: 124.00 cm/s MV A velocity: 81.00 cm/s MV E/A ratio:  1.53 Dietrich Pates MD Electronically signed by Dietrich Pates MD Signature Date/Time: 08/12/2020/7:26:41 PM    Final    VAS US RENAL ARTERY DUPLEX  Result Date: 08/15/2020 ABDOMINAL VISCERAL Indications: Hypertensive emergency. High Risk Factors: Hypertension, coronary artery disease. Performing Technologist: Marilynne Halsted RDMS, RVT  Examination Guidelines: A complete evaluation includes B-mode imaging, spectral Doppler, color Doppler, and power Doppler as needed of all accessible portions of each vessel. Bilateral testing is considered an integral part of a complete examination. Limited examinations for reoccurring indications may be performed as noted.  Duplex Findings: +--------------------+--------+--------+------+--------+ Mesenteric          PSV cm/sEDV cm/sPlaqueComments +--------------------+--------+--------+------+--------+ Aorta Prox             83                          +--------------------+--------+--------+------+--------+ Celiac Artery Origin  192                          +--------------------+--------+--------+------+--------+ SMA Proximal          178                          +--------------------+--------+--------+------+--------+    +------------------+--------+--------+-------+ Right Renal ArteryPSV cm/sEDV cm/sComment +------------------+--------+--------+-------+ Origin              205      35           +------------------+--------+--------+-------+ Proximal            116      17           +------------------+--------+--------+-------+ Mid                  96      17           +------------------+--------+--------+-------+ Distal               74      17           +------------------+--------+--------+-------+ +-----------------+--------+--------+-------+ Left Renal ArteryPSV  cm/sEDV cm/sComment +-----------------+--------+--------+-------+ Origin             111      25           +-----------------+--------+--------+-------+ Proximal           112      28           +-----------------+--------+--------+-------+  Mid                114      29           +-----------------+--------+--------+-------+ Distal             118      19           +-----------------+--------+--------+-------+ +------------+--------+--------+----+-----------+--------+--------+----+ Right KidneyPSV cm/sEDV cm/sRI  Left KidneyPSV cm/sEDV cm/sRI   +------------+--------+--------+----+-----------+--------+--------+----+ Upper Pole  24      8       0.71Upper Pole 32      9       0.71 +------------+--------+--------+----+-----------+--------+--------+----+ Mid         31      9       0.        23      7       0.69 +------------+--------+--------+----+-----------+--------+--------+----+ Lower Pole  20      6       0.72Lower Pole 32      9       0.74 +------------+--------+--------+----+-----------+--------+--------+----+ Hilar       30      9       0.70Hilar      34      10      0.70 +------------+--------+--------+----+-----------+--------+--------+----+ +------------------+-----+------------------+-----+ Right Kidney           Left Kidney             +------------------+-----+------------------+-----+ RAR                    RAR                     +------------------+-----+------------------+-----+ RAR (manual)      2.4  RAR (manual)      1.3   +------------------+-----+------------------+-----+ Cortex                 Cortex                  +------------------+-----+------------------+-----+ Cortex thickness       Corex thickness         +------------------+-----+------------------+-----+ Kidney length (cm)11.66Kidney length (cm)10.94 +------------------+-----+------------------+-----+  Summary: Renal:  Right: 1-59%  stenosis of the right renal artery. Abnormal right        Resistive Index. Normal size right kidney. Left:  No evidence of left renal artery stenosis. Abnormal left        Resisitve Index. Normal size of left kidney.  *See table(s) above for measurements and observations.  Diagnosing physician: Lemar Livings MD  Electronically signed by Lemar Livings MD on 08/15/2020 at 6:01:20 PM.    Final     Echocardiogram, exploration of left peroneal artery, harvest of left greater saphenous vein, amputation of left first toe   Subjective: Patient was seen and examined at bedside.  Overnight events noted.  Patient reports feeling much better.   Patient has ambulated on the left heel.  Patient cleared from vascular surgery to be discharged.  Patient want to be discharged.  Discharge Exam: Vitals:   08/21/20 0422 08/21/20 0816  BP: (!) 177/79 (!) 181/80  Pulse: 85 77  Resp: 19 18  Temp: 98.5 F (36.9 C) 98.4 F (36.9 C)  SpO2: 97% 98%   Vitals:   08/20/20 1950 08/20/20 2322 08/21/20 0422 08/21/20 0816  BP: (!) 159/61 (!) 161/78 (!) 177/79 (!) 181/80  Pulse: 95 87 85 77  Resp: 20 18 19  18  Temp: 98 F (36.7 C) 97.8 F (36.6 C) 98.5 F (36.9 C) 98.4 F (36.9 C)  TempSrc: Oral Oral Oral Oral  SpO2: 97% 99% 97% 98%  Weight:   49.5 kg   Height:        General: Pt is alert, awake, not in acute distress Cardiovascular: RRR, S1/S2 +, no rubs, no gallops Respiratory: CTA bilaterally, no wheezing, no rhonchi Abdominal: Soft, NT, ND, bowel sounds + Extremities: no edema, no cyanosis    The results of significant diagnostics from this hospitalization (including imaging, microbiology, ancillary and laboratory) are listed below for reference.     Microbiology: Recent Results (from the past 240 hour(s))  Surgical PCR screen     Status: None   Collection Time: 08/16/20  7:43 AM   Specimen: Nasal Mucosa; Nasal Swab  Result Value Ref Range Status   MRSA, PCR NEGATIVE NEGATIVE Final    Staphylococcus aureus NEGATIVE NEGATIVE Final    Comment: (NOTE) The Xpert SA Assay (FDA approved for NASAL specimens in patients 46 years of age and older), is one component of a comprehensive surveillance program. It is not intended to diagnose infection nor to guide or monitor treatment. Performed at [facility], [address] [place], [place] [address]      Labs: BNP (last 3 results) Recent Labs    04/19/20 0255 08/10/20 2352  BNP 1,081.0* 2,259.0*   Basic Metabolic Panel: Recent Labs  Lab 08/16/20 0251 08/17/20 0041 08/18/20 0205 08/19/20 0452 08/20/20 0133 08/21/20 0114  NA 140 139 139 140 140 136  K 3.6 3.0* 3.3* 3.3* 3.3* 3.7  CL 109 112* 109 113* 113* 111  CO2 22 20* 20* 19* 19* 17*  GLUCOSE 101* 96 119* 102* 91 140*  BUN CREATININE 0.80 0.71 0.76 0.84 0.71 0.85  CALCIUM 9.1 8.8* 8.8* 8.9 8.8* 8.5*  MG 1.9  --  1.9  --  1.8 1.7  PHOS  --   --   --   --  3.6 3.8   Liver Function Tests: No results for input(s): AST, ALT, ALKPHOS, BILITOT, PROT, ALBUMIN in the last 168 hours. No results for input(s): LIPASE, AMYLASE in the last 168 hours. No results for input(s): AMMONIA in the last 168 hours. CBC: Recent Labs  Lab 08/17/20 0041 08/18/20 0205 08/19/20 0452 08/20/20 0133 08/21/20 0114  WBC 8.6 9.8 9.2 8.1 8.6  HGB 8.2* 8.3* 8.4* 8.5* 8.5*  HCT 25.2* 25.4* 26.4* 25.5* 27.5*  MCV 90.6 91.7 91.7 90.7 93.9  PLT 193 208 207 208 232   Cardiac Enzymes: No results for input(s): CKTOTAL, CKMB, CKMBINDEX, TROPONINI in the last 168 hours. BNP: Invalid input(s): POCBNP CBG: No results for input(s): GLUCAP in the last 168 hours. D-Dimer No results for input(s): DDIMER in the last 72 hours. Hgb A1c No results for input(s): HGBA1C in the last 72 hours. Lipid Profile No results for input(s): CHOL, HDL, LDLCALC, TRIG, CHOLHDL, LDLDIRECT in the last 72 hours. Thyroid function studies No results for input(s): TSH, T4TOTAL, T3FREE,  THYROIDAB in the last 72 hours.  Invalid input(s): FREET3 Anemia work up No results for input(s): VITAMINB12, FOLATE, FERRITIN, TIBC, IRON, RETICCTPCT in the last 72 hours. Urinalysis No results found for: COLORURINE, APPEARANCEUR, LABSPEC, PHURINE, GLUCOSEU, HGBUR, BILIRUBINUR, KETONESUR, PROTEINUR, UROBILINOGEN, NITRITE, LEUKOCYTESUR Sepsis Labs Invalid input(s): PROCALCITONIN,  WBC,  LACTICIDVEN Microbiology Recent Results (from the past 240 hour(s))  Surgical PCR screen     Status:  None   Collection Time: 08/16/20  7:43 AM   Specimen: Nasal Mucosa; Nasal Swab  Result Value Ref Range Status   MRSA, PCR NEGATIVE NEGATIVE Final   Staphylococcus aureus NEGATIVE NEGATIVE Final    Comment: (NOTE) The Xpert SA Assay (FDA approved for NASAL specimens in patients 35 years of age and older), is one component of a comprehensive surveillance program. It is not intended to diagnose infection nor to guide or monitor treatment. Performed at [facility], [address] [place], [place] [address]      Time coordinating discharge: Over 30 minutes  SIGNED:   Cipriano Bunker, MD  [facility] 08/21/2020, 12:27 PM Pager   If 7PM-7AM, please contact night-coverage [email or web address]

## 2020-08-29 LAB — ALDOSTERONE + RENIN ACTIVITY W/ RATIO
ALDO / PRA Ratio: UNDETERMINED
Aldosterone: <1 ng/dL (ref 0.0–30.0)
PRA LC/MS/MS: <0.167 ng/mL/hr — ABNORMAL LOW (ref 0.167–5.380)

## 2020-09-12 ENCOUNTER — Ambulatory Visit (INDEPENDENT_AMBULATORY_CARE_PROVIDER_SITE_OTHER): Payer: Self-pay | Admitting: Vascular Surgery

## 2020-09-12 ENCOUNTER — Encounter: Payer: Self-pay | Admitting: Vascular Surgery

## 2020-09-12 ENCOUNTER — Other Ambulatory Visit: Payer: Self-pay

## 2020-09-12 VITALS — BP 174/78 | HR 66 | Temp 97.9°F | Resp 20 | Ht 62.0 in | Wt 103.0 lb

## 2020-09-12 DIAGNOSIS — I739 Peripheral vascular disease, unspecified: Secondary | ICD-10-CM

## 2020-09-12 NOTE — Progress Notes (Signed)
Patient is a 65 year old female who returns for follow-up today.  She has unreconstructable arterial occlusive disease.  She recently underwent exploration of her peroneal artery and amputation of her left first toe.  The toe wound has failed to heal.  The peroneal exposure site has also failed to heal.  She has continuing rest pain in the left foot.  Her saphenectomy site on the left leg has healed.  She is on Eliquis for atrial fibrillation.  Past Medical History:  Diagnosis Date  . Hypertension     Past Surgical History:  Procedure Laterality Date  . ABDOMINAL AORTOGRAM W/LOWER EXTREMITY Bilateral 08/16/2020   Procedure: ABDOMINAL AORTOGRAM W/LOWER EXTREMITY;  Surgeon: Sherren Kerns, MD;  Location: [facility] INVASIVE CV LAB;  Service: Cardiovascular;  Laterality: Bilateral;  . AMPUTATION Left 08/20/2020   Procedure: LEFT FIRST TOE AMPUTATION;  Surgeon: Sherren Kerns, MD;  Location: [facility] OR;  Service: Vascular;  Laterality: Left;  . BYPASS GRAFT FEMORAL-PERONEAL Left 08/20/2020   Procedure: exploration left peroneal artery ;  Surgeon: Sherren Kerns, MD;  Location: [facility] OR;  Service: Vascular;  Laterality: Left;  . CHOLECYSTECTOMY    . VEIN HARVEST Left 08/20/2020   Procedure: VEIN HARVEST LEFT GREATER SAPHENOUS VEIN;  Surgeon: Sherren Kerns, MD;  Location: [facility] OR;  Service: Vascular;  Laterality: Left;     Current Outpatient Medications on File Prior to Visit  Medication Sig Dispense Refill  . acetaminophen (TYLENOL) 325 MG tablet Take 650 mg by mouth every 6 (six) hours as needed for moderate pain or headache.    Marland Kitchen apixaban (ELIQUIS) 5 MG TABS tablet TAKE 1 TABLET (5 MG TOTAL) BY MOUTH TWO TIMES DAILY. 60 tablet 2  . carvedilol (COREG) 25 MG tablet Take 1 tablet (25 mg total) by mouth 2 (two) times daily with a meal. 60 tablet 2  . diltiazem (CARDIZEM CD) 360 MG 24 hr capsule Take 1 capsule (360 mg total) by mouth daily. 30 capsule 1  . hydrALAZINE (APRESOLINE) 100 MG tablet Take 1  tablet (100 mg total) by mouth every 8 (eight) hours. 90 tablet 1  . ibuprofen (ADVIL) 200 MG tablet Take 400 mg by mouth every 6 (six) hours as needed for mild pain.    . isosorbide mononitrate (IMDUR) 60 MG 24 hr tablet Take 1 tablet (60 mg total) by mouth daily. 30 tablet 1  . lisinopril (ZESTRIL) 40 MG tablet Take 1 tablet (40 mg total) by mouth daily. 30 tablet 2  . pantoprazole (PROTONIX) 40 MG tablet Take 1 tablet (40 mg total) by mouth daily. 30 tablet 1   No current facility-administered medications on file prior to visit.    Physical exam:  Vitals:   09/12/20 1457  BP: (!) 174/78  Pulse: 66  Resp: 20  Temp: 97.9 F (36.6 C)  SpO2: 96%  Weight: 103 lb (46.7 kg)  Height: 5\' 2"  (1.575 m)    Extremities: Gangrenous left first toe wound separating wound perineal exposure just above the left ankle no palpable pedal pulses, diffuse edema from the knee to the foot bilaterally  Assessment: Unreconstructable tibial occlusive disease left leg.  Nonhealing wounds.  Plan: Left above-knee amputation scheduled for September 24, 2020.  Risk benefits possible complications and procedure details were discussed with the patient today she understands agrees to proceed.  We will stop her Eliquis 3 days prior.  She will need to be bridged with Lovenox.  September 26, 2020, MD [facility] Office: [phone]

## 2020-09-16 ENCOUNTER — Other Ambulatory Visit: Payer: Self-pay

## 2020-09-23 ENCOUNTER — Telehealth: Payer: Self-pay

## 2020-09-23 NOTE — Telephone Encounter (Signed)
Multiple unsuccessful attempts made to reach pt to schedule L AKA with Dr. Darrick Penna. UTR letter will be mailed to pt to contact office.

## 2020-09-24 ENCOUNTER — Other Ambulatory Visit: Payer: Self-pay

## 2020-09-25 MED ORDER — ENOXAPARIN SODIUM 40 MG/0.4ML ~~LOC~~ SOLN: 40.0000 mg | Freq: Two times a day (BID) | SUBCUTANEOUS | 0 refills | Status: DC

## 2020-09-27 ENCOUNTER — Telehealth: Payer: Self-pay

## 2020-09-27 NOTE — Telephone Encounter (Signed)
Late entry for 09/24/20  Spoke with pt regarding scheduling L AKA surgery. Pt requested to push out surgery until end of May. Scheduled pt for 10/29/20. Instructions reviewed, including covid test,  when to stop Eliquis and Lovenox bridging. Pt verbalized understanding.

## 2020-09-27 NOTE — Telephone Encounter (Signed)
Patient's daughter called - patient did not want to have an above the knee amputation due to prosthesis issues. Unsure if this decision was made before or after patient called back to schedule surgery on 4/26. Daughter is trying to assist her mother in making difficult decisions, and thinks that her mother would benefit from talking to MD again. Made telephone appt for them next week.

## 2020-10-03 ENCOUNTER — Ambulatory Visit (INDEPENDENT_AMBULATORY_CARE_PROVIDER_SITE_OTHER): Payer: Self-pay | Admitting: Vascular Surgery

## 2020-10-03 ENCOUNTER — Other Ambulatory Visit: Payer: Self-pay

## 2020-10-03 ENCOUNTER — Encounter: Payer: Self-pay | Admitting: Vascular Surgery

## 2020-10-03 DIAGNOSIS — I739 Peripheral vascular disease, unspecified: Secondary | ICD-10-CM

## 2020-10-03 NOTE — Progress Notes (Signed)
Virtual Visit via Telephone Note  I connected with Sarah Bonilla on 10/03/2020 using the [email or web address] by telephone and verified that I was speaking with the correct person using two identifiers. Patient was located at home and accompanied by her daughter by conference call. I am located at our office on [facility].   The limitations of evaluation and management by telemedicine and the availability of in person appointments have been previously discussed with the patient and are documented in the patients chart. The patient expressed understanding and consented to proceed.  PCP: Patient, No Pcp Per (Inactive)  History of Present Illness: Sarah Bonilla is a 65 y.o. female with a known history of peripheral arterial disease.  She recently had exploration of her left leg for possible bypass but she was unreconstructable.  She was last seen here in our office on April 14.  At that time I would discussed with her the possibility of a left above-knee amputation.  She did not want to schedule this until the end of May.  We discussed the possibility of infection and continued pain.  She was not ready to proceed with amputation at that point.  She is on Eliquis for atrial fibrillation.  Today's televisit was with a three-way conference call with her daughter on the phone as well.   Past Medical History:  Diagnosis Date  . Hypertension   . Peripheral arterial disease [facility])     Past Surgical History:  Procedure Laterality Date  . ABDOMINAL AORTOGRAM W/LOWER EXTREMITY Bilateral 08/16/2020   Procedure: ABDOMINAL AORTOGRAM W/LOWER EXTREMITY;  Surgeon: Sherren Kerns, MD;  Location: [facility] INVASIVE CV LAB;  Service: Cardiovascular;  Laterality: Bilateral;  . AMPUTATION Left 08/20/2020   Procedure: LEFT FIRST TOE AMPUTATION;  Surgeon: Sherren Kerns, MD;  Location: [facility] OR;  Service: Vascular;  Laterality: Left;  . BYPASS GRAFT FEMORAL-PERONEAL Left 08/20/2020   Procedure: exploration left peroneal  artery ;  Surgeon: Sherren Kerns, MD;  Location: [facility] OR;  Service: Vascular;  Laterality: Left;  . CHOLECYSTECTOMY    . VEIN HARVEST Left 08/20/2020   Procedure: VEIN HARVEST LEFT GREATER SAPHENOUS VEIN;  Surgeon: Sherren Kerns, MD;  Location: [facility] OR;  Service: Vascular;  Laterality: Left;    Current Meds  Medication Sig  . acetaminophen (TYLENOL) 325 MG tablet Take 650 mg by mouth every 6 (six) hours as needed for moderate pain or headache.  Marland Kitchen apixaban (ELIQUIS) 5 MG TABS tablet TAKE 1 TABLET (5 MG TOTAL) BY MOUTH TWO TIMES DAILY.  . carvedilol (COREG) 25 MG tablet Take 1 tablet (25 mg total) by mouth 2 (two) times daily with a meal.  . diltiazem (CARDIZEM CD) 360 MG 24 hr capsule Take 1 capsule (360 mg total) by mouth daily.  Melene Muller ON 10/25/2020] enoxaparin (LOVENOX) 40 MG/0.4ML injection Inject 0.4 mLs (40 mg total) into the skin every 12 (twelve) hours for 3 days.  . hydrALAZINE (APRESOLINE) 100 MG tablet Take 1 tablet (100 mg total) by mouth every 8 (eight) hours.  Marland Kitchen ibuprofen (ADVIL) 200 MG tablet Take 400 mg by mouth every 6 (six) hours as needed for mild pain.  . isosorbide mononitrate (IMDUR) 60 MG 24 hr tablet Take 1 tablet (60 mg total) by mouth daily.  Marland Kitchen lisinopril (ZESTRIL) 40 MG tablet Take 1 tablet (40 mg total) by mouth daily.  . pantoprazole (PROTONIX) 40 MG tablet Take 1 tablet (40 mg total) by mouth daily.    She has no shortness  of breath.  She has no chest pain.   Assessment and Plan: Severe unreconstructable peripheral arterial disease best option for pain control is a left above-knee amputation.  I discussed with the patient and her daughter today the risk benefits possible complications and procedure details of above-knee amputation.  Also discussed with him the possibility that she could develop infection in the leg without this.  Patient currently wishes to think about it more.  She is on the OR schedule for Oct 29, 2020.  I discussed with the patient and her  daughter today that we could certainly move this up earlier at any time.  Since she is on Eliquis we would need to stop this 3 days prior to the procedure.  We also discussed postoperative management with follow-up rehab prosthetic legs.  They are going to think about more with decision they would like to proceed with.   The patient was provided an opportunity to ask questions and all were answered. The patient agreed with the plan and demonstrated an understanding of the instructions.   The patient was advised to call back or seek an in-person evaluation if the symptoms worsen or if the condition fails to improve as anticipated.  I spent 10 minutes with the patient via telephone encounter.   Signed, Fabienne Bruns [facility] [place] Office: [phone]  10/03/2020, 10:54 AM

## 2020-10-21 ENCOUNTER — Ambulatory Visit: Payer: Self-pay | Admitting: Cardiology

## 2020-10-21 NOTE — Progress Notes (Incomplete)
Clinical Summary Ms. Cerino is a 65 y.o.female seen as a new patietn  1. HTN   -history of prior medication noncompliance   2. Paroxysmal aflutter - noted during 07/2020 admission   3. PAD - followed by vascular, prior amputation of left first toe - from last vascular note considering possible left AKA   4. CAD - noted by CT scan  5. Chronic diastolci HF - 07/2020 echo LVEF 55-60%, grade II diastolic dysfunction   6. Mitral regurgitation - mod to severe by 07/2020 echo   Past Medical History:  Diagnosis Date  . Hypertension   . Peripheral arterial disease (HCC)      No Known Allergies   Current Outpatient Medications  Medication Sig Dispense Refill  . acetaminophen (TYLENOL) 325 MG tablet Take 650 mg by mouth every 6 (six) hours as needed for moderate pain or headache.    Marland Kitchen apixaban (ELIQUIS) 5 MG TABS tablet TAKE 1 TABLET (5 MG TOTAL) BY MOUTH TWO TIMES DAILY. 60 tablet 2  . carvedilol (COREG) 25 MG tablet Take 1 tablet (25 mg total) by mouth 2 (two) times daily with a meal. 60 tablet 2  . diltiazem (CARDIZEM CD) 360 MG 24 hr capsule Take 1 capsule (360 mg total) by mouth daily. 30 capsule 1  . [START ON 10/25/2020] enoxaparin (LOVENOX) 40 MG/0.4ML injection Inject 0.4 mLs (40 mg total) into the skin every 12 (twelve) hours for 3 days. 2.4 mL 0  . hydrALAZINE (APRESOLINE) 100 MG tablet Take 1 tablet (100 mg total) by mouth every 8 (eight) hours. 90 tablet 1  . ibuprofen (ADVIL) 200 MG tablet Take 400 mg by mouth every 6 (six) hours as needed for mild pain.    . isosorbide mononitrate (IMDUR) 60 MG 24 hr tablet Take 1 tablet (60 mg total) by mouth daily. 30 tablet 1  . lisinopril (ZESTRIL) 40 MG tablet Take 1 tablet (40 mg total) by mouth daily. 30 tablet 2  . pantoprazole (PROTONIX) 40 MG tablet Take 1 tablet (40 mg total) by mouth daily. 30 tablet 1   No current facility-administered medications for this visit.     Past Surgical History:  Procedure  Laterality Date  . ABDOMINAL AORTOGRAM W/LOWER EXTREMITY Bilateral 08/16/2020   Procedure: ABDOMINAL AORTOGRAM W/LOWER EXTREMITY;  Surgeon: Sherren Kerns, MD;  Location: [facility] INVASIVE CV LAB;  Service: Cardiovascular;  Laterality: Bilateral;  . AMPUTATION Left 08/20/2020   Procedure: LEFT FIRST TOE AMPUTATION;  Surgeon: Sherren Kerns, MD;  Location: [facility] OR;  Service: Vascular;  Laterality: Left;  . BYPASS GRAFT FEMORAL-PERONEAL Left 08/20/2020   Procedure: exploration left peroneal artery ;  Surgeon: Sherren Kerns, MD;  Location: [facility] OR;  Service: Vascular;  Laterality: Left;  . CHOLECYSTECTOMY    . VEIN HARVEST Left 08/20/2020   Procedure: VEIN HARVEST LEFT GREATER SAPHENOUS VEIN;  Surgeon: Sherren Kerns, MD;  Location: [facility] OR;  Service: Vascular;  Laterality: Left;     No Known Allergies    Family History  Problem Relation Age of Onset  . CVA Mother   . Hypertension Father      Social History Ms. Curbow reports that she has been smoking cigarettes. She has been smoking about 1.00 pack per day. She has never used smokeless tobacco. Ms. Tribbey reports current alcohol use.   Review of Systems CONSTITUTIONAL: No weight loss, fever, chills, weakness or fatigue.  HEENT: Eyes: No visual loss, blurred vision, double vision or yellow sclerae.No hearing loss,  sneezing, congestion, runny nose or sore throat.  SKIN: No rash or itching.  CARDIOVASCULAR:  RESPIRATORY: No shortness of breath, cough or sputum.  GASTROINTESTINAL: No anorexia, nausea, vomiting or diarrhea. No abdominal pain or blood.  GENITOURINARY: No burning on urination, no polyuria NEUROLOGICAL: No headache, dizziness, syncope, paralysis, ataxia, numbness or tingling in the extremities. No change in bowel or bladder control.  MUSCULOSKELETAL: No muscle, back pain, joint pain or stiffness.  LYMPHATICS: No enlarged nodes. No history of splenectomy.  PSYCHIATRIC: No history of depression or anxiety.   ENDOCRINOLOGIC: No reports of sweating, cold or heat intolerance. No polyuria or polydipsia.  Marland Kitchen   Physical Examination There were no vitals filed for this visit. There were no vitals filed for this visit.  Gen: resting comfortably, no acute distress HEENT: no scleral icterus, pupils equal round and reactive, no palptable cervical adenopathy,  CV Resp: Clear to auscultation bilaterally GI: abdomen is soft, non-tender, non-distended, normal bowel sounds, no hepatosplenomegaly MSK: extremities are warm, no edema.  Skin: warm, no rash Neuro:  no focal deficits Psych: appropriate affect   Diagnostic Studies 07/2020 echo IMPRESSIONS    1. Hypokinesis of the distal anteroseptal distal anterolateral wall;  akinesis of the apex. . Left ventricular ejection fraction, by estimation,  is 55 to 60%. The left ventricle has normal function. Left ventricular  diastolic parameters are consistent  with Grade II diastolic dysfunction (pseudonormalization).  2. Right ventricular systolic function is normal. The right ventricular  size is normal. Mildly increased right ventricular wall thickness. There  is moderately elevated pulmonary artery systolic pressure.  3. Left atrial size was severely dilated.  4. MR is predominantly cnetral, though some is directed posterior into  LA. . Moderate to severe mitral valve regurgitation.  5. Tricuspid valve regurgitation is moderate.  6. The aortic valve is tricuspid. Aortic valve regurgitation is mild.  Mild aortic valve sclerosis is present, with no evidence of aortic valve  stenosis.  7. The inferior vena cava is dilated in size with <50% respiratory  variability, suggesting right atrial pressure of 15 mmHg.     Assessment and Plan        Antoine Poche, M.D., F.A.C.C.

## 2020-10-22 ENCOUNTER — Encounter: Payer: Self-pay | Admitting: Cardiology

## 2020-10-25 ENCOUNTER — Encounter (HOSPITAL_COMMUNITY): Payer: Self-pay | Admitting: Vascular Surgery

## 2020-10-25 ENCOUNTER — Encounter (HOSPITAL_COMMUNITY): Payer: Self-pay | Admitting: Physician Assistant

## 2020-10-25 ENCOUNTER — Telehealth: Payer: Self-pay

## 2020-10-25 ENCOUNTER — Other Ambulatory Visit (HOSPITAL_COMMUNITY): Payer: Self-pay

## 2020-10-25 NOTE — Telephone Encounter (Signed)
Received notification from [facility] preadmission nurse Sarah Bonilla, that pt does not want to proceed with surgery on 10/29/20 with Dr. Darrick Penna for L AKA per pts daughter Sarah Bonilla.   Attempted to reach pt and daughter to inquire on plans to reschedule surgery. Unable to reach both parties.

## 2020-10-25 NOTE — Progress Notes (Signed)
Spoke with patient's Daughter Claris Che [phone]) who states patient is not going to have surgery on 10/29/20.  Leota Sauers to contact Dr Darrick Penna office to inform them of cancellation.  I called Kia at MD's office and informed her that daughter requested cancellation of surgery and LaToya would be giving her a call.

## 2020-10-25 NOTE — Telephone Encounter (Signed)
Pts daughter Glee Arvin returned call. Stated pt went to another doctor and is requesting to cancel surgery with Dr. Darrick Penna altogether because pt doesn't want it.   Surgery has been canceled and provider notified.

## 2020-10-29 ENCOUNTER — Inpatient Hospital Stay (HOSPITAL_COMMUNITY): Admission: RE | Admit: 2020-10-29 | Payer: Self-pay | Source: Home / Self Care | Admitting: Vascular Surgery

## 2020-10-29 HISTORY — DX: Gastro-esophageal reflux disease without esophagitis: K21.9

## 2020-10-29 SURGERY — AMPUTATION, ABOVE KNEE
Anesthesia: General | Site: Knee | Laterality: Left

## 2020-10-30 ENCOUNTER — Emergency Department
Admission: EM | Admit: 2020-10-30 | Discharge: 2020-10-30 | Disposition: A | Discharge status: Home / Self Care | Payer: Medicaid Other | Attending: Emergency Medicine | Admitting: Emergency Medicine

## 2020-10-30 ENCOUNTER — Other Ambulatory Visit: Payer: Self-pay

## 2020-10-30 DIAGNOSIS — L97929 Non-pressure chronic ulcer of unspecified part of left lower leg with unspecified severity: Secondary | ICD-10-CM | POA: Diagnosis not present

## 2020-10-30 DIAGNOSIS — Z95820 Peripheral vascular angioplasty status with implants and grafts: Secondary | ICD-10-CM | POA: Diagnosis not present

## 2020-10-30 DIAGNOSIS — J449 Chronic obstructive pulmonary disease, unspecified: Secondary | ICD-10-CM | POA: Insufficient documentation

## 2020-10-30 DIAGNOSIS — E876 Hypokalemia: Secondary | ICD-10-CM | POA: Diagnosis not present

## 2020-10-30 DIAGNOSIS — F17210 Nicotine dependence, cigarettes, uncomplicated: Secondary | ICD-10-CM | POA: Diagnosis not present

## 2020-10-30 DIAGNOSIS — M542 Cervicalgia: Secondary | ICD-10-CM | POA: Diagnosis not present

## 2020-10-30 DIAGNOSIS — I110 Hypertensive heart disease with heart failure: Secondary | ICD-10-CM | POA: Diagnosis not present

## 2020-10-30 DIAGNOSIS — Z79899 Other long term (current) drug therapy: Secondary | ICD-10-CM | POA: Insufficient documentation

## 2020-10-30 DIAGNOSIS — I739 Peripheral vascular disease, unspecified: Secondary | ICD-10-CM | POA: Insufficient documentation

## 2020-10-30 DIAGNOSIS — I5033 Acute on chronic diastolic (congestive) heart failure: Secondary | ICD-10-CM | POA: Insufficient documentation

## 2020-10-30 DIAGNOSIS — M79662 Pain in left lower leg: Secondary | ICD-10-CM | POA: Diagnosis present

## 2020-10-30 LAB — COMPREHENSIVE METABOLIC PANEL
ALT: 16 U/L (ref 0–44)
AST: 18 U/L (ref 15–41)
Albumin: 3.1 g/dL — ABNORMAL LOW (ref 3.5–5.0)
Alkaline Phosphatase: 90 U/L (ref 38–126)
Anion gap: 11 (ref 5–15)
BUN: 17 mg/dL (ref 8–23)
CO2: 29 mmol/L (ref 22–32)
Calcium: 9 mg/dL (ref 8.9–10.3)
Chloride: 103 mmol/L (ref 98–111)
Creatinine, Ser: 0.67 mg/dL (ref 0.44–1.00)
GFR, Estimated: >60 mL/min (ref >60)
Glucose, Bld: 112 mg/dL — ABNORMAL HIGH (ref 70–99)
Potassium: 2.7 mmol/L — CL (ref 3.5–5.1)
Sodium: 143 mmol/L (ref 135–145)
Total Bilirubin: 0.6 mg/dL (ref 0.3–1.2)
Total Protein: 7.2 g/dL (ref 6.5–8.1)

## 2020-10-30 LAB — CBC WITH DIFFERENTIAL/PLATELET
Abs Immature Granulocytes: 0.03 10*3/uL (ref 0.00–0.07)
Basophils Absolute: 0 10*3/uL (ref 0.0–0.1)
Basophils Relative: 0 %
Eosinophils Absolute: 0.1 10*3/uL (ref 0.0–0.5)
Eosinophils Relative: 1 %
HCT: 29.6 % — ABNORMAL LOW (ref 36.0–46.0)
Hemoglobin: 9.3 g/dL — ABNORMAL LOW (ref 12.0–15.0)
Immature Granulocytes: 0 %
Lymphocytes Relative: 31 %
Lymphs Abs: 2.6 10*3/uL (ref 0.7–4.0)
MCH: 26.3 pg (ref 26.0–34.0)
MCHC: 31.4 g/dL (ref 30.0–36.0)
MCV: 83.6 fL (ref 80.0–100.0)
Monocytes Absolute: 0.7 10*3/uL (ref 0.1–1.0)
Monocytes Relative: 9 %
Neutro Abs: 4.9 10*3/uL (ref 1.7–7.7)
Neutrophils Relative %: 59 %
Platelets: 242 10*3/uL (ref 150–400)
RBC: 3.54 MIL/uL — ABNORMAL LOW (ref 3.87–5.11)
RDW: 18.7 % — ABNORMAL HIGH (ref 11.5–15.5)
WBC: 8.4 10*3/uL (ref 4.0–10.5)
nRBC: 0 % (ref 0.0–0.2)

## 2020-10-30 LAB — URINALYSIS, COMPLETE (UACMP) WITH MICROSCOPIC
Bilirubin Urine: NEGATIVE
Glucose, UA: NEGATIVE mg/dL
Ketones, ur: NEGATIVE mg/dL
Nitrite: NEGATIVE
Protein, ur: NEGATIVE mg/dL
Specific Gravity, Urine: 1.008 (ref 1.005–1.030)
pH: 5 (ref 5.0–8.0)

## 2020-10-30 LAB — LACTIC ACID, PLASMA: Lactic Acid, Venous: 1.4 mmol/L (ref 0.5–1.9)

## 2020-10-30 MED ORDER — POTASSIUM CHLORIDE CRYS ER 20 MEQ PO TBCR
40.0000 meq | EXTENDED_RELEASE_TABLET | Freq: Once | ORAL | Status: AC
  Administered 2020-10-30: 40 meq via ORAL
  Filled 2020-10-30: qty 2

## 2020-10-30 NOTE — Discharge Instructions (Signed)
Please call the clinic of Dr. Wyn Quaker first thing in the morning to set up an appointment in the next few days of the next week.  If you develop any fevers, pus coming from these wounds, please return to the ED.

## 2020-10-30 NOTE — ED Notes (Signed)
Dressing applied to wound on left foot using xerform, guaze, kerlex, and ace wrap.

## 2020-10-30 NOTE — ED Triage Notes (Signed)
Pt comes with c/o left leg pain and possible infection. Pt states this started back in March. Pt states amputation of big toe. Pt states she was informed they would need to amputate her leg.  Pt wants 2nd opinion and to see if it is necessary.

## 2020-10-30 NOTE — ED Provider Notes (Signed)
[facility] Emergency Department Provider Note ____________________________________________   Event Date/Time   First MD Initiated Contact with Patient 10/30/20 1626     (approximate)  I have reviewed the triage vital signs and the nursing notes.  HISTORY  Chief Complaint Leg Pain   HPI Sarah Bonilla is a 65 y.o. femalewho presents to the ED for evaluation of neck pain and possible infection.  Chart review indicates history of HTN, atrial flutter on Eliquis and PAD. Follows with Dr. Darrick Penna, vascular surgery.  Severe PAD with recent angiogram of the left leg without anatomy amenable to reconstructive surgery and bypass, and so left above the knee amputation was recommended.  The surgery was planned for May 31, yesterday, and then subsequently canceled last week.   Patient presents to the ED requesting a second opinion and has questions about the need for above-the-knee amputation.  She denies any symptoms such as fevers, chills, worsening leg pain, pus coming from his wounds, falls or trauma.  Reports it feels the same.  She reports dissatisfaction with her previous vascular surgeon due to "all those medical words" and having a poor understanding of why she may need surgery to begin with.  She asks me to explain to her and simpler terms why her leg needs to be cut off.   We discussed my understanding of her severe PAD and nonhealing wounds, risk for infection, sepsis and worsening ulcerative lesions.   Her daughter later comes to the bedside and has some additional questions that I answered to the best of my ability.  They request evaluation by another vascular surgeon in our group.   Past Medical History:  Diagnosis Date  . GERD (gastroesophageal reflux disease)   . Hypertension   . Peripheral arterial disease [facility])     Patient Active Problem List   Diagnosis Date Noted  . Atrial flutter (HCC) 08/14/2020  . Acute on chronic diastolic congestive  heart failure (HCC)   . Ulcer of great toe, left, with unspecified severity (HCC)   . Dry gangrene (HCC)   . Acute pulmonary edema (HCC)   . Leg swelling   . Acute CHF (congestive heart failure) (HCC) 04/19/2020  . COPD (chronic obstructive pulmonary disease) (HCC) 04/19/2020  . Essential hypertension 04/19/2020  . Hypertensive emergency 04/19/2020  . Hypokalemia 04/19/2020  . Localized swelling of left lower extremity 04/19/2020  . Hypoalbuminemia 04/19/2020  . Elevated brain natriuretic peptide (BNP) level 04/19/2020  . Elevated troponin I level 04/19/2020  . Thrombocytopenia (HCC) 04/19/2020  . Tobacco abuse 04/19/2020  . Noncompliance with medication regimen 04/19/2020    Past Surgical History:  Procedure Laterality Date  . ABDOMINAL AORTOGRAM W/LOWER EXTREMITY Bilateral 08/16/2020   Procedure: ABDOMINAL AORTOGRAM W/LOWER EXTREMITY;  Surgeon: Sherren Kerns, MD;  Location: [facility] INVASIVE CV LAB;  Service: Cardiovascular;  Laterality: Bilateral;  . AMPUTATION Left 08/20/2020   Procedure: LEFT FIRST TOE AMPUTATION;  Surgeon: Sherren Kerns, MD;  Location: [facility] OR;  Service: Vascular;  Laterality: Left;  . BYPASS GRAFT FEMORAL-PERONEAL Left 08/20/2020   Procedure: exploration left peroneal artery ;  Surgeon: Sherren Kerns, MD;  Location: [facility] OR;  Service: Vascular;  Laterality: Left;  . CHOLECYSTECTOMY    . VEIN HARVEST Left 08/20/2020   Procedure: VEIN HARVEST LEFT GREATER SAPHENOUS VEIN;  Surgeon: Sherren Kerns, MD;  Location: [facility] OR;  Service: Vascular;  Laterality: Left;    Prior to Admission medications   Medication Sig Start Date End Date Taking? Authorizing  Provider  acetaminophen (TYLENOL) 325 MG tablet Take 650 mg by mouth every 6 (six) hours as needed for moderate pain or headache.    [provider]  apixaban (ELIQUIS) 5 MG TABS tablet TAKE 1 TABLET (5 MG TOTAL) BY MOUTH TWO TIMES DAILY. 08/21/20 08/21/21  Cipriano Bunker, MD  carvedilol (COREG) 25 MG tablet  Take 1 tablet (25 mg total) by mouth 2 (two) times daily with a meal. 04/24/20   Vassie Loll, MD  diltiazem (CARDIZEM CD) 360 MG 24 hr capsule Take 1 capsule (360 mg total) by mouth daily. 08/22/20   Cipriano Bunker, MD  enoxaparin (LOVENOX) 40 MG/0.4ML injection Inject 0.4 mLs (40 mg total) into the skin every 12 (twelve) hours for 3 days. 10/25/20 10/28/20  Sherren Kerns, MD  hydrALAZINE (APRESOLINE) 100 MG tablet Take 1 tablet (100 mg total) by mouth every 8 (eight) hours. 08/21/20   Cipriano Bunker, MD  ibuprofen (ADVIL) 200 MG tablet Take 400 mg by mouth every 6 (six) hours as needed for mild pain.    [provider]  isosorbide mononitrate (IMDUR) 60 MG 24 hr tablet Take 1 tablet (60 mg total) by mouth daily. 08/22/20   Cipriano Bunker, MD  lisinopril (ZESTRIL) 40 MG tablet Take 1 tablet (40 mg total) by mouth daily. 04/25/20   Vassie Loll, MD  pantoprazole (PROTONIX) 40 MG tablet Take 1 tablet (40 mg total) by mouth daily. 04/25/20   Vassie Loll, MD    Allergies Patient has no known allergies.  Family History  Problem Relation Age of Onset  . CVA Mother   . Hypertension Father     Social History Social History   Tobacco Use  . Smoking status: Current Every Day Smoker    Packs/day: 1.00    Types: Cigarettes  . Smokeless tobacco: Never Used  Vaping Use  . Vaping Use: Never used  Substance Use Topics  . Alcohol use: Yes    Comment: occasionally   . Drug use: Never    Review of Systems  Constitutional: No fever/chills Eyes: No visual changes. ENT: No sore throat. Cardiovascular: Denies chest pain. Respiratory: Denies shortness of breath. Gastrointestinal: No abdominal pain.  No nausea, no vomiting.  No diarrhea.  No constipation. Genitourinary: Negative for dysuria. Musculoskeletal: Negative for back pain.  Positive for chronic leg pain Skin: Negative for rash. Neurological: Negative for headaches, focal weakness or  numbness.  ____________________________________________   PHYSICAL EXAM:  VITAL SIGNS: Vitals:   10/30/20 1542  BP: (!) 145/56  Pulse: 71  Resp: 19  Temp: 98.7 F (37.1 C)  SpO2: 100%    Constitutional: Alert and oriented. Well appearing and in no acute distress. Eyes: Conjunctivae are normal. PERRL. EOMI. Head: Atraumatic. Nose: No congestion/rhinnorhea. Mouth/Throat: Mucous membranes are moist.  Oropharynx non-erythematous. Neck: No stridor. No cervical spine tenderness to palpation. Cardiovascular: Normal rate, regular rhythm. Grossly normal heart sounds.  Good peripheral circulation. Respiratory: Normal respiratory effort.  No retractions. Lungs CTAB. Gastrointestinal: Soft , nondistended, nontender to palpation. No CVA tenderness. Musculoskeletal: No joint effusions. No signs of acute trauma. Left foot is s/p great toe amputation with some dry gangrene at the wound bed and otherwise with granulation tissue. 4 cm circular ulcerative lesion into the subcutaneous tissue over the left medial lower leg without purulence, erythema.  Poor cap refill to the left foot and leg.  Neurologic:  Normal speech and language. No gross focal neurologic deficits are appreciated. No gait instability noted. Skin:  Skin is warm, dry  and intact. No rash noted. Psychiatric: Mood and affect are normal. Speech and behavior are normal. ____________________________________________   LABS (all labs ordered are listed, but only abnormal results are displayed)  Labs Reviewed  COMPREHENSIVE METABOLIC PANEL - Abnormal; Notable for the following components:      Result Value   Potassium 2.7 (*)    Glucose, Bld 112 (*)    Albumin 3.1 (*)    All other components within normal limits  CBC WITH DIFFERENTIAL/PLATELET - Abnormal; Notable for the following components:   RBC 3.54 (*)    Hemoglobin 9.3 (*)    HCT 29.6 (*)    RDW 18.7 (*)    All other components within normal limits  URINALYSIS, COMPLETE  (UACMP) WITH MICROSCOPIC - Abnormal; Notable for the following components:   Color, Urine STRAW (*)    APPearance CLEAR (*)    Hgb urine dipstick SMALL (*)    Leukocytes,Ua TRACE (*)    Bacteria, UA RARE (*)    All other components within normal limits  LACTIC ACID, PLASMA    ____________________________________________   PROCEDURES and INTERVENTIONS  Procedure(s) performed (including Critical Care):  Procedures  Medications  potassium chloride SA (KLOR-CON) CR tablet 40 mEq (40 mEq Oral Given 10/30/20 1712)    ____________________________________________   MDM / ED COURSE   65 year old woman with known severe PAD on Eliquis presents to the ED requesting a second opinion, amenable to outpatient management with follow-up with our vascular surgery team.  Normal vitals.  Patient was clinically well without evidence of systemic disease or acute pathology.  Chronic ulcerative lesions to her left leg and foot are noted, but without evidence of superimposed infectious features.  Blood work without evidence of lactic acidosis, leukocytosis or other signs of systemic illness.  Noted to be hypokalemic, which was repleted orally.  I answered the patient's questions and discussed the case with Dr. Wyn Quaker, our vascular surgeon, who agrees to see the patient in clinic in the next 1 week to discuss AKA versus revascularization surgery.  I discussed return precautions with the patient and she is stable for outpatient management.  Clinical Course as of 10/30/20 1750  Wed Oct 30, 2020  1721 I speak with Dr. Wyn Quaker who would be happy to see the patient in the clinic later this week or early next week. He will send a message to the scheduler to be on the lookout for patient calling.  [DS]    Clinical Course User Index [DS] Delton Prairie, MD    ____________________________________________   FINAL CLINICAL IMPRESSION(S) / ED DIAGNOSES  Final diagnoses:  PAD (peripheral artery disease) (HCC)   Hypokalemia     ED Discharge Orders    None       Luisangel Wainright Katrinka Blazing   Note:  This document was prepared using Dragon voice recognition software and may include unintentional dictation errors.   Delton Prairie, MD 10/30/20 [phone]

## 2020-11-01 ENCOUNTER — Other Ambulatory Visit (HOSPITAL_COMMUNITY): Payer: Self-pay

## 2020-11-04 ENCOUNTER — Emergency Department
Admission: EM | Admit: 2020-11-04 | Discharge: 2020-11-04 | Disposition: A | Discharge status: Home / Self Care | Payer: Medicaid Other | Attending: Emergency Medicine | Admitting: Emergency Medicine

## 2020-11-04 ENCOUNTER — Other Ambulatory Visit: Payer: Self-pay

## 2020-11-04 ENCOUNTER — Encounter: Payer: Self-pay | Admitting: Emergency Medicine

## 2020-11-04 DIAGNOSIS — E876 Hypokalemia: Secondary | ICD-10-CM | POA: Diagnosis not present

## 2020-11-04 DIAGNOSIS — Z79899 Other long term (current) drug therapy: Secondary | ICD-10-CM | POA: Insufficient documentation

## 2020-11-04 DIAGNOSIS — Z20822 Contact with and (suspected) exposure to covid-19: Secondary | ICD-10-CM | POA: Insufficient documentation

## 2020-11-04 DIAGNOSIS — I5033 Acute on chronic diastolic (congestive) heart failure: Secondary | ICD-10-CM | POA: Diagnosis not present

## 2020-11-04 DIAGNOSIS — F17210 Nicotine dependence, cigarettes, uncomplicated: Secondary | ICD-10-CM | POA: Insufficient documentation

## 2020-11-04 DIAGNOSIS — I5032 Chronic diastolic (congestive) heart failure: Secondary | ICD-10-CM | POA: Diagnosis present

## 2020-11-04 DIAGNOSIS — I10 Essential (primary) hypertension: Secondary | ICD-10-CM

## 2020-11-04 DIAGNOSIS — I70269 Atherosclerosis of native arteries of extremities with gangrene, unspecified extremity: Secondary | ICD-10-CM | POA: Diagnosis present

## 2020-11-04 DIAGNOSIS — I110 Hypertensive heart disease with heart failure: Secondary | ICD-10-CM | POA: Diagnosis not present

## 2020-11-04 DIAGNOSIS — I739 Peripheral vascular disease, unspecified: Secondary | ICD-10-CM | POA: Insufficient documentation

## 2020-11-04 DIAGNOSIS — M79672 Pain in left foot: Secondary | ICD-10-CM | POA: Diagnosis present

## 2020-11-04 DIAGNOSIS — S81802A Unspecified open wound, left lower leg, initial encounter: Secondary | ICD-10-CM | POA: Diagnosis present

## 2020-11-04 DIAGNOSIS — I4892 Unspecified atrial flutter: Secondary | ICD-10-CM

## 2020-11-04 DIAGNOSIS — Z720 Tobacco use: Secondary | ICD-10-CM | POA: Diagnosis present

## 2020-11-04 DIAGNOSIS — J449 Chronic obstructive pulmonary disease, unspecified: Secondary | ICD-10-CM | POA: Insufficient documentation

## 2020-11-04 LAB — CBC WITH DIFFERENTIAL/PLATELET
Abs Immature Granulocytes: 0.02 10*3/uL (ref 0.00–0.07)
Basophils Absolute: 0 10*3/uL (ref 0.0–0.1)
Basophils Relative: 0 %
Eosinophils Absolute: 0.1 10*3/uL (ref 0.0–0.5)
Eosinophils Relative: 1 %
HCT: 31.2 % — ABNORMAL LOW (ref 36.0–46.0)
Hemoglobin: 9.8 g/dL — ABNORMAL LOW (ref 12.0–15.0)
Immature Granulocytes: 0 %
Lymphocytes Relative: 22 %
Lymphs Abs: 2.1 10*3/uL (ref 0.7–4.0)
MCH: 26.2 pg (ref 26.0–34.0)
MCHC: 31.4 g/dL (ref 30.0–36.0)
MCV: 83.4 fL (ref 80.0–100.0)
Monocytes Absolute: 0.7 10*3/uL (ref 0.1–1.0)
Monocytes Relative: 7 %
Neutro Abs: 6.7 10*3/uL (ref 1.7–7.7)
Neutrophils Relative %: 70 %
Platelets: 234 10*3/uL (ref 150–400)
RBC: 3.74 MIL/uL — ABNORMAL LOW (ref 3.87–5.11)
RDW: 19 % — ABNORMAL HIGH (ref 11.5–15.5)
WBC: 9.6 10*3/uL (ref 4.0–10.5)
nRBC: 0 % (ref 0.0–0.2)

## 2020-11-04 LAB — COMPREHENSIVE METABOLIC PANEL
ALT: 14 U/L (ref 0–44)
AST: 17 U/L (ref 15–41)
Albumin: 3 g/dL — ABNORMAL LOW (ref 3.5–5.0)
Alkaline Phosphatase: 95 U/L (ref 38–126)
Anion gap: 12 (ref 5–15)
BUN: 21 mg/dL (ref 8–23)
CO2: 29 mmol/L (ref 22–32)
Calcium: 8.6 mg/dL — ABNORMAL LOW (ref 8.9–10.3)
Chloride: 101 mmol/L (ref 98–111)
Creatinine, Ser: 0.98 mg/dL (ref 0.44–1.00)
GFR, Estimated: >60 mL/min (ref >60)
Glucose, Bld: 128 mg/dL — ABNORMAL HIGH (ref 70–99)
Potassium: 2.1 mmol/L — CL (ref 3.5–5.1)
Sodium: 142 mmol/L (ref 135–145)
Total Bilirubin: 0.6 mg/dL (ref 0.3–1.2)
Total Protein: 7.3 g/dL (ref 6.5–8.1)

## 2020-11-04 LAB — BASIC METABOLIC PANEL
Anion gap: 10 (ref 5–15)
BUN: 20 mg/dL (ref 8–23)
CO2: 29 mmol/L (ref 22–32)
Calcium: 8.5 mg/dL — ABNORMAL LOW (ref 8.9–10.3)
Chloride: 101 mmol/L (ref 98–111)
Creatinine, Ser: 0.78 mg/dL (ref 0.44–1.00)
GFR, Estimated: >60 mL/min (ref >60)
Glucose, Bld: 89 mg/dL (ref 70–99)
Potassium: 2.8 mmol/L — ABNORMAL LOW (ref 3.5–5.1)
Sodium: 140 mmol/L (ref 135–145)

## 2020-11-04 LAB — MAGNESIUM: Magnesium: 1.9 mg/dL (ref 1.7–2.4)

## 2020-11-04 MED ORDER — POTASSIUM CHLORIDE 10 MEQ/100ML IV SOLN
10.0000 meq | Freq: Once | INTRAVENOUS | Status: DC
  Filled 2020-11-04: qty 100

## 2020-11-04 MED ORDER — POTASSIUM CHLORIDE CRYS ER 20 MEQ PO TBCR
60.0000 meq | EXTENDED_RELEASE_TABLET | ORAL | Status: DC
  Administered 2020-11-04: 60 meq via ORAL
  Filled 2020-11-04: qty 3

## 2020-11-04 MED ORDER — POTASSIUM CHLORIDE CRYS ER 20 MEQ PO TBCR
40.0000 meq | EXTENDED_RELEASE_TABLET | Freq: Once | ORAL | Status: DC
  Filled 2020-11-04: qty 2

## 2020-11-04 MED ORDER — POTASSIUM CHLORIDE 10 MEQ/100ML IV SOLN
10.0000 meq | INTRAVENOUS | Status: DC
  Administered 2020-11-04 (×2): 10 meq via INTRAVENOUS
  Filled 2020-11-04 (×2): qty 100

## 2020-11-04 MED ORDER — OXYCODONE-ACETAMINOPHEN 5-325 MG PO TABS
1.0000 | ORAL_TABLET | Freq: Once | ORAL | Status: AC
  Administered 2020-11-04: 1 via ORAL
  Filled 2020-11-04: qty 1

## 2020-11-04 MED ORDER — POTASSIUM CHLORIDE CRYS ER 20 MEQ PO TBCR: 20.0000 meq | EXTENDED_RELEASE_TABLET | Freq: Two times a day (BID) | ORAL | 0 refills | Status: AC

## 2020-11-04 MED ORDER — MORPHINE SULFATE (PF) 4 MG/ML IV SOLN
4.0000 mg | Freq: Once | INTRAVENOUS | Status: AC
Start: 2020-11-04 — End: 2020-11-04
  Administered 2020-11-04: 4 mg via INTRAVENOUS
  Filled 2020-11-04: qty 1

## 2020-11-04 MED ORDER — HYDROCODONE-ACETAMINOPHEN 5-325 MG PO TABS: 1.0000 | ORAL_TABLET | Freq: Four times a day (QID) | ORAL | 0 refills | Status: DC | PRN

## 2020-11-04 MED ORDER — ONDANSETRON HCL 4 MG/2ML IJ SOLN
4.0000 mg | Freq: Once | INTRAMUSCULAR | Status: AC
  Administered 2020-11-04: 4 mg via INTRAVENOUS
  Filled 2020-11-04: qty 2

## 2020-11-04 MED ORDER — POTASSIUM CHLORIDE CRYS ER 20 MEQ PO TBCR: 40.0000 meq | EXTENDED_RELEASE_TABLET | ORAL | Status: DC

## 2020-11-04 NOTE — Consult Note (Signed)
Medical Consultation   [name]  RSW:[id number]  DOB: 02-19-1956  DOA: 11/04/2020  PCP: Avon Gully, MD   Outpatient Specialists:  Requesting physician: -Dr. Cyril Loosen of ED  Reason for consultation: -Hypokalemia with potassium 2.1   History of Present Illness: TIA Sarah Bonilla is an 65 y.o. female with a past medical history of hypertension, COPD, GERD, dCHF, atrial flutter on Eliquis (currently on hold), tobacco abuse, severe PVD, chronic wound to left leg and left foot, s/p of amputation of her left greater toe, who presents with left leg and foot pain, and found to have hypokalemia with potassium 2.1.  Pt has chronic left leg and left foot pain with chronic wound in leg and foot due to severe PAD. Pt was seen by Dr. Jettie Booze of [facility]. Per Dr. 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 Pt has severe PAD with recent angiogram of the left leg without anatomy amenable to reconstructive surgery and bypass, and so left above the knee amputation was recommended by Dr. Jettie Booze. The procedure was scheduled on 5/31, however she had opted not to do that.  Subsequently was seen in our emergency department for a second opinion on 6/1. She was referred to Dr. Wyn Quaker vascular surgery.  Patient continues to have pain in left foot and left leg, which is constant, severe, nonradiating. Patient does not have fever or chills.  No chest pain, cough, shortness of breath.  Denies nausea, vomiting, diarrhea or abdominal pain.  No symptoms of UTI.  Per Dr. Cyril Loosen of ED, Dr. Wyn Quaker of [facility] is expecting to see the patient as an outpatient. Dr. Cyril Loosen discussed case with Dr. Gilda Crease of vascular surgery who agrees with outpatient follow-up. We are initially consulted to admit pt mainly due to hypokalemia with potassium of 2.1.  I discussed with Dr. Cyril Loosen. I recommended to replete the potassium in ED, and recheck potassium level, then discharge pt home and  follow-up with vascular surgeon.   ED course: WBC 9.6, pending COVID-19 PCR, potassium 2.1, magnesium 1.9, renal function okay, temperature 97.6, blood pressure 125/57, heart rate 76, RR 20, oxygen saturation 99% on room air.   Review of Systems:   General: no fevers, chills, no changes in body weight, no changes in appetite Skin: no rash HEENT: no blurry vision, hearing changes or sore throat Pulm: no dyspnea, coughing, wheezing CV: no chest pain, palpitations, shortness of breath Abd: no nausea/vomiting, abdominal pain, diarrhea/constipation GU: no dysuria, hematuria, polyuria Ext: has left leg and foot pain. Has open wounds in left leg and left foot. Neuro: no weakness, numbness, or tingling    Past Medical History: Past Medical History:  Diagnosis Date  . GERD (gastroesophageal reflux disease)   . Hypertension   . Peripheral arterial disease (HCC)     Past Surgical History: Past Surgical History:  Procedure Laterality Date  . ABDOMINAL AORTOGRAM W/LOWER EXTREMITY Bilateral 08/16/2020   Procedure: ABDOMINAL AORTOGRAM W/LOWER EXTREMITY;  Surgeon: Sherren Kerns, MD;  Location: [facility] INVASIVE CV LAB;  Service: Cardiovascular;  Laterality: Bilateral;  . AMPUTATION Left 08/20/2020   Procedure: LEFT FIRST TOE AMPUTATION;  Surgeon: Sherren Kerns, MD;  Location: [facility] OR;  Service: Vascular;  Laterality: Left;  . BYPASS GRAFT FEMORAL-PERONEAL Left 08/20/2020   Procedure: exploration left peroneal artery ;  Surgeon: Sherren Kerns, MD;  Location: [facility] OR;  Service: Vascular;  Laterality: Left;  .  CHOLECYSTECTOMY    . VEIN HARVEST Left 08/20/2020   Procedure: VEIN HARVEST LEFT GREATER SAPHENOUS VEIN;  Surgeon: Sherren Kerns, MD;  Location: [facility] OR;  Service: Vascular;  Laterality: Left;     Allergies:  No Known Allergies   Social History:  reports that she has been smoking cigarettes. She has been smoking about 1.00 pack per day. She has never used smokeless tobacco. She  reports current alcohol use. She reports that she does not use drugs.   Family History: Family History  Problem Relation Age of Onset  . CVA Mother   . Hypertension Father    Physical Exam: Vitals:   11/04/20 1436 11/04/20 1438  BP:  (!) 125/57  Pulse:  76  Resp:  20  Temp:  97.6 F (36.4 C)  TempSrc:  Oral  SpO2:  99%  Weight: 46.7 kg   Height: 5\' 2"  (1.575 m)      General: Not in acute distress HEENT: PERRL, EOMI, no scleral icterus, No JVD or bruit Cardiac: S1/S2, RRR, No murmurs, gallops or rubs Pulm: Good air movement bilaterally. Clear to auscultation bilaterally. No rales, wheezing, rhonchi or rubs. Abd: Soft, nondistended, nontender, no rebound pain, no organomegaly, BS present Ext: s/p of left great toe with has gangrene change. Has open wound in left lower leg and foot.    Musculoskeletal: No joint deformities, erythema, or stiffness, ROM full Skin: No rashes.  Neuro: Alert and oriented X3, cranial nerves II-XII grossly intact. Psych: Patient is not psychotic, no suicidal or hemocidal ideation.   Data reviewed:  I have personally reviewed following labs and imaging studies Labs:  CBC: Recent Labs  Lab 10/30/20 1516 11/04/20 1439  WBC 8.4 9.6  NEUTROABS 4.9 6.7  HGB 9.3* 9.8*  HCT 29.6* 31.2*  MCV 83.6 83.4  PLT 242 234    Basic Metabolic Panel: Recent Labs  Lab 10/30/20 1516 11/04/20 1439 11/04/20 1518  NA 143 142  --   K 2.7* 2.1*  --   CL 103 101  --   CO2 29 29  --   GLUCOSE 112* 128*  --   BUN 17 21  --   CREATININE 0.67 0.98  --   CALCIUM 9.0 8.6*  --   MG  --   --  1.9   GFR Estimated Creatinine Clearance: 42.8 mL/min (by C-G formula based on SCr of 0.98 mg/dL). Liver Function Tests: Recent Labs  Lab 10/30/20 1516 11/04/20 1439  AST 18 17  ALT 16 14  ALKPHOS 90 95  BILITOT 0.6 0.6  PROT 7.2 7.3  ALBUMIN 3.1* 3.0*   No results for input(s): LIPASE, AMYLASE in the last 168 hours. No results for input(s): AMMONIA in the  last 168 hours. Coagulation profile No results for input(s): INR, PROTIME in the last 168 hours.  Cardiac Enzymes: No results for input(s): CKTOTAL, CKMB, CKMBINDEX, TROPONINI in the last 168 hours. BNP: Invalid input(s): POCBNP CBG: No results for input(s): GLUCAP in the last 168 hours. D-Dimer No results for input(s): DDIMER in the last 72 hours. Hgb A1c No results for input(s): HGBA1C in the last 72 hours. Lipid Profile No results for input(s): CHOL, HDL, LDLCALC, TRIG, CHOLHDL, LDLDIRECT in the last 72 hours. Thyroid function studies No results for input(s): TSH, T4TOTAL, T3FREE, THYROIDAB in the last 72 hours.  Invalid input(s): FREET3 Anemia work up No results for input(s): VITAMINB12, FOLATE, FERRITIN, TIBC, IRON, RETICCTPCT in the last 72 hours. Urinalysis    Component Value Date/Time  COLORURINE STRAW (A) 10/30/2020 1713   APPEARANCEUR CLEAR (A) 10/30/2020 1713   LABSPEC 1.008 10/30/2020 1713   PHURINE 5.0 10/30/2020 1713   GLUCOSEU NEGATIVE 10/30/2020 1713   HGBUR SMALL (A) 10/30/2020 1713   BILIRUBINUR NEGATIVE 10/30/2020 1713   KETONESUR NEGATIVE 10/30/2020 1713   PROTEINUR NEGATIVE 10/30/2020 1713   NITRITE NEGATIVE 10/30/2020 1713   LEUKOCYTESUR TRACE (A) 10/30/2020 1713     Microbiology No results found for this or any previous visit (from the past 240 hour(s)).     Inpatient Medications:   Scheduled Meds: . potassium chloride  40 mEq Oral Once   Continuous Infusions: . potassium chloride       Radiological Exams on Admission: No results found.  Impression/Recommendations Principal Problem:   PAD (peripheral artery disease) (HCC) Active Problems:   COPD (chronic obstructive pulmonary disease) (HCC)   Essential hypertension   Hypokalemia   Tobacco abuse   Atrial flutter (HCC)   Wound of left leg   Chronic diastolic CHF (congestive heart failure) (HCC)   PAD (peripheral artery disease) and wound of left leg and left foot with  gangrenous change: Amputation is recommended by Dr. Jettie Booze. -pain control -f/u with [facility]  Hypokalemia: K 2.1. Mg 1.9 -will replete K with 10 mEq x 4 by IV and 60 mEq of Kdur x 2 (total of 160 mEq, expecting to increase K to 3.7) -after finishing repletion of K, need to recheck K level before d/c home. -f/u with PCP for rechecking K level.  COPD (chronic obstructive pulmonary disease) (HCC): Stable.  No shortness of breath or cough -Continue home albuterol  Essential hypertension -Patient is on Coreg, hydralazine, Cozaar, Cardizem, lisinopril, Imdur  Tobacco abuse -Consulted importance of quitting smoking  Atrial flutter (HCC) -pt is on coreg and Cardizem -pt states the her Eliquis is on hold since Wednesday -Patient may need to restart Eliquis if no surgery done soon  Chronic diastolic CHF (congestive heart failure) (HCC): 2D echo on 08/12/2020 showed EF of 55 to 60% with grade 2 diastolic dysfunction.  Patient is euvolemic.  No leg edema.  No worsening shortness of breath. -Patient is on Lasix.   Thank you for this consultation.  Our [facility] hospitalist team will follow the patient with you.   Time Spent: 35 min  Lorretta Harp M.D. [facility] 11/04/2020, 4:02 PM

## 2020-11-04 NOTE — ED Notes (Signed)
Pt given warm blanket.

## 2020-11-04 NOTE — ED Provider Notes (Signed)
[facility] Emergency Department Provider Note   ____________________________________________    I have reviewed the triage vital signs and the nursing notes.   HISTORY  Chief Complaint Foot Pain     HPI Sarah Bonilla is a 65 y.o. female with a history of peripheral arterial disease, CHF, COPD who presents with left foot pain.  Patient has chronic left foot pain related to severe PAD.  She has seen Dr. Darrick Penna of vascular surgery who has recommended left below the knee amputation.  This had apparently been scheduled on the 31st however she had opted not to do that.  Subsequently was seen in our emergency department for a second opinion, was referred to Dr. Wyn Quaker vascular surgery.  She represents today because of pain in her left foot, apparently has now decided that an amputation will be necessary.  No fevers or chills.  No redness.  Past Medical History:  Diagnosis Date  . GERD (gastroesophageal reflux disease)   . Hypertension   . Peripheral arterial disease [facility])     Patient Active Problem List   Diagnosis Date Noted  . Wound of left leg 11/04/2020  . Chronic diastolic CHF (congestive heart failure) (HCC) 11/04/2020  . PAD (peripheral artery disease) (HCC) 11/04/2020  . Atrial flutter (HCC) 08/14/2020  . Acute on chronic diastolic congestive heart failure (HCC)   . Ulcer of great toe, left, with unspecified severity (HCC)   . Dry gangrene (HCC)   . Acute pulmonary edema (HCC)   . Leg swelling   . Acute CHF (congestive heart failure) (HCC) 04/19/2020  . COPD (chronic obstructive pulmonary disease) (HCC) 04/19/2020  . Essential hypertension 04/19/2020  . Hypertensive emergency 04/19/2020  . Hypokalemia 04/19/2020  . Localized swelling of left lower extremity 04/19/2020  . Hypoalbuminemia 04/19/2020  . Elevated brain natriuretic peptide (BNP) level 04/19/2020  . Elevated troponin I level 04/19/2020  . Thrombocytopenia (HCC) 04/19/2020  .  Tobacco abuse 04/19/2020  . Noncompliance with medication regimen 04/19/2020    Past Surgical History:  Procedure Laterality Date  . ABDOMINAL AORTOGRAM W/LOWER EXTREMITY Bilateral 08/16/2020   Procedure: ABDOMINAL AORTOGRAM W/LOWER EXTREMITY;  Surgeon: Sherren Kerns, MD;  Location: [facility] INVASIVE CV LAB;  Service: Cardiovascular;  Laterality: Bilateral;  . AMPUTATION Left 08/20/2020   Procedure: LEFT FIRST TOE AMPUTATION;  Surgeon: Sherren Kerns, MD;  Location: [facility] OR;  Service: Vascular;  Laterality: Left;  . BYPASS GRAFT FEMORAL-PERONEAL Left 08/20/2020   Procedure: exploration left peroneal artery ;  Surgeon: Sherren Kerns, MD;  Location: [facility] OR;  Service: Vascular;  Laterality: Left;  . CHOLECYSTECTOMY    . VEIN HARVEST Left 08/20/2020   Procedure: VEIN HARVEST LEFT GREATER SAPHENOUS VEIN;  Surgeon: Sherren Kerns, MD;  Location: [facility] OR;  Service: Vascular;  Laterality: Left;    Prior to Admission medications   Medication Sig Start Date End Date Taking? Authorizing Provider  apixaban (ELIQUIS) 5 MG TABS tablet TAKE 1 TABLET (5 MG TOTAL) BY MOUTH TWO TIMES DAILY. 08/21/20 08/21/21 Yes Cipriano Bunker, MD  carvedilol (COREG) 25 MG tablet Take 1 tablet (25 mg total) by mouth 2 (two) times daily with a meal. 04/24/20  Yes Vassie Loll, MD  furosemide (LASIX) 40 MG tablet Take 40 mg by mouth daily. 09/05/20  Yes [provider]  hydrALAZINE (APRESOLINE) 100 MG tablet Take 1 tablet (100 mg total) by mouth every 8 (eight) hours. 08/21/20  Yes Cipriano Bunker, MD  HYDROcodone-acetaminophen (NORCO/VICODIN) 5-325 MG tablet Take  1 tablet by mouth every 6 (six) hours as needed for severe pain. 11/04/20  Yes Jene Every, MD  losartan (COZAAR) 50 MG tablet Take 50 mg by mouth daily. 09/05/20  Yes [provider]  potassium chloride (KLOR-CON) 10 MEQ tablet Take 10 mEq by mouth daily. 09/05/20  Yes [provider]  potassium chloride SA (KLOR-CON) 20 MEQ tablet Take 1 tablet  (20 mEq total) by mouth 2 (two) times daily for 10 days. 11/04/20 11/14/20 Yes Jene Every, MD  acetaminophen (TYLENOL) 325 MG tablet Take 650 mg by mouth every 6 (six) hours as needed for moderate pain or headache.    [provider]  albuterol (VENTOLIN HFA) 108 (90 Base) MCG/ACT inhaler Inhale 1 puff into the lungs 4 (four) times daily as needed for wheezing or shortness of breath. 10/09/20   [provider]  diltiazem (CARDIZEM CD) 360 MG 24 hr capsule Take 1 capsule (360 mg total) by mouth daily. 08/22/20   Cipriano Bunker, MD  enoxaparin (LOVENOX) 40 MG/0.4ML injection Inject 0.4 mLs (40 mg total) into the skin every 12 (twelve) hours for 3 days. 10/25/20 10/28/20  Sherren Kerns, MD  ibuprofen (ADVIL) 200 MG tablet Take 400 mg by mouth every 6 (six) hours as needed for mild pain.    [provider]  isosorbide mononitrate (IMDUR) 60 MG 24 hr tablet Take 1 tablet (60 mg total) by mouth daily. 08/22/20   Cipriano Bunker, MD  lisinopril (ZESTRIL) 40 MG tablet Take 1 tablet (40 mg total) by mouth daily. 04/25/20   Vassie Loll, MD  pantoprazole (PROTONIX) 40 MG tablet Take 1 tablet (40 mg total) by mouth daily. Patient not taking: Reported on 11/04/2020 04/25/20   Vassie Loll, MD     Allergies Patient has no known allergies.  Family History  Problem Relation Age of Onset  . CVA Mother   . Hypertension Father     Social History Social History   Tobacco Use  . Smoking status: Current Every Day Smoker    Packs/day: 1.00    Types: Cigarettes  . Smokeless tobacco: Never Used  Vaping Use  . Vaping Use: Never used  Substance Use Topics  . Alcohol use: Yes    Comment: occasionally   . Drug use: Never    Review of Systems  Constitutional: No fever/chills Eyes: No visual changes.  ENT: No sore throat. Cardiovascular: Denies chest pain. Respiratory: Denies shortness of breath. Gastrointestinal: No abdominal pain.  No nausea, no vomiting.   Genitourinary:  Negative for dysuria. Musculoskeletal: As above Skin: Chronic ulceration Neurological: Negative for headaches     ____________________________________________   PHYSICAL EXAM:  VITAL SIGNS: ED Triage Vitals  Enc Vitals Group     BP 11/04/20 1438 (!) 125/57     Pulse Rate 11/04/20 1438 76     Resp 11/04/20 1438 20     Temp 11/04/20 1438 97.6 F (36.4 C)     Temp Source 11/04/20 1438 Oral     SpO2 11/04/20 1438 99 %     Weight 11/04/20 1436 46.7 kg (102 lb 15.3 oz)     Height 11/04/20 1436 1.575 m (5\' 2" )     Head Circumference --      Peak Flow --      Pain Score 11/04/20 1436 10     Pain Loc --      Pain Edu? --      Excl. in GC? --     Constitutional: Alert and oriented.  Nose: No congestion/rhinnorhea. Mouth/Throat: Mucous membranes are moist.   Neck:  Painless ROM Cardiovascular: Normal rate, regular rhythm. Grossly normal heart sounds.  Good peripheral circulation. Respiratory: Normal respiratory effort.  No retractions. Lungs CTAB. Gastrointestinal: Soft and nontender. No distention.  No CVA tenderness. Genitourinary: deferred Musculoskeletal:   Neurologic:  Normal speech and language. No gross focal neurologic deficits are appreciated.  Skin:  Skin is warm, dry, see images above Psychiatric: Mood and affect are normal. Speech and behavior are normal.  ____________________________________________   LABS (all labs ordered are listed, but only abnormal results are displayed)  Labs Reviewed  CBC WITH DIFFERENTIAL/PLATELET - Abnormal; Notable for the following components:      Result Value   RBC 3.74 (*)    Hemoglobin 9.8 (*)    HCT 31.2 (*)    RDW 19.0 (*)    All other components within normal limits  COMPREHENSIVE METABOLIC PANEL - Abnormal; Notable for the following components:   Potassium 2.1 (*)    Glucose, Bld 128 (*)    Calcium 8.6 (*)    Albumin 3.0 (*)    All other components within normal limits  BASIC METABOLIC PANEL - Abnormal; Notable  for the following components:   Potassium 2.8 (*)    Calcium 8.5 (*)    All other components within normal limits  SARS CORONAVIRUS 2 (TAT 6-24 HRS)  MAGNESIUM   ____________________________________________  EKG  ED ECG REPORT I, Jene Every, the attending physician, personally viewed and interpreted this ECG.  Date: 11/04/2020  Rhythm: normal sinus rhythm QRS Axis: normal Intervals: Nonspecific ST/T Wave abnormalities: Nonspecific Narrative Interpretation: no evidence of acute ischemia  ____________________________________________  RADIOLOGY  None ____________________________________________   PROCEDURES  Procedure(s) performed: No  Procedures   Critical Care performed: No ____________________________________________   INITIAL IMPRESSION / ASSESSMENT AND PLAN / ED COURSE  Pertinent labs & imaging results that were available during my care of the patient were reviewed by me and considered in my medical decision making (see chart for details).  Patient presents with chronic left foot pain as described above.  Has been working closely with vascular surgery in [place] but apparently has requested second pending with vascular surgery here.  Dr. Wyn Quaker is expecting to see the patient as an outpatient.  Lab work today is notable for potassium of 2.1.  Consulted Dr. Clyde Lundborg for admission however he recommends repletion in the emergency department and discharge  Discussed case with Dr. Gilda Crease vascular surgery who agrees with outpatient follow-up  Repeat potassium much improved, appropriate for discharge at this time, analgesics provided  Spoke with daughter on the phone who became upset and berated me on the phone because amputation has not been performed today, explained to her that no indication for emergent amputation and outpatient follow-up is reasonable and vascular surgery is on board.    ____________________________________________   FINAL CLINICAL  IMPRESSION(S) / ED DIAGNOSES  Final diagnoses:  Hypokalemia  Peripheral arterial disease (HCC)        Note:  This document was prepared using Dragon voice recognition software and may include unintentional dictation errors.   Jene Every, MD 11/04/20 2014

## 2020-11-04 NOTE — ED Triage Notes (Signed)
First Nurse Note:  Arrives for ED evaluation of left leg infection.  Daughter states patient was seen for same last week.  Daughter states Dr. Wyn Quaker is surgeon and patient 'needs to be admitted to have a BKA'.  Patient is AAOx3.  Skin warm and dry. NAD

## 2020-11-05 ENCOUNTER — Ambulatory Visit (INDEPENDENT_AMBULATORY_CARE_PROVIDER_SITE_OTHER): Payer: Self-pay | Admitting: Vascular Surgery

## 2020-11-05 ENCOUNTER — Telehealth (INDEPENDENT_AMBULATORY_CARE_PROVIDER_SITE_OTHER): Payer: Self-pay

## 2020-11-05 ENCOUNTER — Encounter (INDEPENDENT_AMBULATORY_CARE_PROVIDER_SITE_OTHER): Payer: Self-pay | Admitting: Vascular Surgery

## 2020-11-05 VITALS — BP 168/78 | HR 70 | Ht 62.0 in | Wt 98.0 lb

## 2020-11-05 DIAGNOSIS — I10 Essential (primary) hypertension: Secondary | ICD-10-CM

## 2020-11-05 DIAGNOSIS — Z720 Tobacco use: Secondary | ICD-10-CM

## 2020-11-05 DIAGNOSIS — M7989 Other specified soft tissue disorders: Secondary | ICD-10-CM

## 2020-11-05 DIAGNOSIS — I70262 Atherosclerosis of native arteries of extremities with gangrene, left leg: Secondary | ICD-10-CM

## 2020-11-05 LAB — SARS CORONAVIRUS 2 (TAT 6-24 HRS): SARS Coronavirus 2: NEGATIVE

## 2020-11-05 NOTE — Assessment & Plan Note (Signed)
Represents a significant atherosclerotic risk factor with her severe disease.

## 2020-11-05 NOTE — Assessment & Plan Note (Addendum)
The patient has severe peripheral arterial disease and is status post an attempted a femoral to peroneal bypass another institution which was not technically feasible.  She has gangrenous tissue at the base of her left great toe from the previous amputation site.  She does not have enough flow to heal that wound or the lower incision from the bypass attempt.  She has been recommended to have an amputation which is certainly a reasonable option.  It would appear that the care she has received has been appropriate but she does have very severe disease.  I discussed with her that as best I can tell I do not think there is any true attempted endovascular revascularization I am certainly willing to try that before performing an amputation.  That might also determine whether or not a below-knee amputation may be an option or if we will have to do an above-knee amputation.  I discussed the critical and limb threatening nature of the situation.  I have been very frank and honest with the patient and her daughter and told them that I think that amputation is the most likely outcome and that is true even with successful revascularization.  She has significant tissue loss.  They are interested in trying anything that could potentially offer a limb salvage option, so we will plan on an angiogram this week for further evaluation and potential treatment.

## 2020-11-05 NOTE — Assessment & Plan Note (Signed)
blood pressure control important in reducing the progression of atherosclerotic disease. On appropriate oral medications.  

## 2020-11-05 NOTE — Patient Instructions (Signed)
Peripheral Vascular Disease  Peripheral vascular disease (PVD) is a disease of the blood vessels that carry blood from the heart to the rest of the body. PVD is also called peripheral artery disease (PAD) or poor circulation. PVD affects most of the body. But it affects the legs and feet the most. PVD can lead to acute limb ischemia. This happens when there is a sudden stop of blood flow to an arm or leg. This is a medical emergency. What are the causes? The most common cause of PVD is a buildup of a fatty substance (plaque) inside your arteries. This decreases blood flow. Plaque can break off and block blood in a smaller artery. This can lead to acute limb ischemia. Other common causes of PVD include:  Blood clots inside the blood vessels.  Injuries to blood vessels.  Irritation and swelling of blood vessels.  Sudden tightening of the blood vessel (spasms). What increases the risk?  A family history of PVD.  Medical conditions, including: ? High cholesterol. ? Diabetes. ? High blood pressure. ? Heart disease. ? Past problems with blood clots. ? Past injury, such as burns or a broken bone.  Other conditions, such as: ? Buerger's disease. This is caused by swollen or irritated blood vessels in your hands and feet. ? Arthritis. ? Birth defects that affect the arteries in your legs. ? Kidney disease.  Using tobacco or nicotine products.  Not getting enough exercise.  Being very overweight (obese).  Being 50 years old or older. What are the signs or symptoms?  Cramps in your butt, legs, and feet.  Pain and weakness in your legs when you are active that goes away when you rest.  Leg pain when at rest.  Leg numbness, tingling, or weakness.  Coldness in a leg or foot, especially when compared with the other leg or foot.  Skin or hair changes. These can include: ? Hair loss. ? Shiny skin. ? Pale or bluish skin. ? Thick toenails.  Being unable to get or keep an  erection.  Tiredness (fatigue).  Weak pulse or no pulse in the feet.  Wounds and sores on the toes, feet, or legs. These take longer to heal. How is this treated? Underlying causes are treated first. Other conditions, like diabetes, high cholesterol, and blood pressure, are also treated. Treatment may include:  Lifestyle changes, such as: ? Quitting smoking. ? Getting regular exercise. ? Having a diet low in fat and cholesterol. ? Not drinking alcohol.  Taking medicines, such as: ? Blood thinners. ? Medicines to improve blood flow. ? Medicines to improve your blood cholesterol.  Procedures to: ? Open the arteries and restore blood flow. ? Insert a small mesh tube (stent) to keep a blocked vessel open. ? Create a new path for blood to flow to the body (peripheral bypass). ? Remove dead tissue from a wound. ? Remove an affected leg or arm. Follow these instructions at home: Medicines  Take over-the-counter and prescription medicines only as told by your doctor.  If you are taking blood thinners: ? Talk with your doctor before you take any medicines that have aspirin, or NSAIDs, such as ibuprofen. ? Take medicines exactly as told. Take them at the same time each day. ? Avoid doing things that could hurt or bruise you. Take action to prevent falls. ? Wear an alert bracelet or carry a card that shows you are taking blood thinners. Lifestyle  Get regular exercise. Ask your doctor about how to stay active.    Talk with your doctor about keeping a healthy weight. If needed, ask about losing weight.  Eat a diet that is low in fat and cholesterol. If you need help, talk with your doctor.  Do not drink alcohol.  Do not smoke or use any products that contain nicotine or tobacco. If you need help quitting, ask your doctor.      General instructions  Take good care of your feet. To do this: ? Wear shoes that fit well and feel good. ? Check your feet often for any cuts or  sores.  Get a flu shot (influenza vaccine) each year.  Keep all follow-up visits. Where to find more information  Society for Vascular Surgery: vascular.org  American Heart Association: heart.org  National Heart, Lung, and Blood Institute: nhlbi.nih.gov Contact a doctor if:  You have cramps in your legs when you walk.  You have leg pain when you rest.  Your leg or foot feels cold.  Your skin changes.  You cannot get or keep an erection.  You have cuts or sores on your legs or feet that do not heal. Get help right away if:  You have sudden changes in the color and feeling of your arms or legs, such as: ? Your arm or leg turns cold, numb, and blue. ? Your arm or leg becomes red, warm, swollen, painful, or numb.  You have any signs of a stroke. "BE FAST" is an easy way to remember the main warning signs: ? B - Balance. Dizziness, sudden trouble walking, or loss of balance. ? E - Eyes. Trouble seeing or a change in how you see. ? F - Face. Sudden weakness or loss of feeling of the face. The face or eyelid may droop on one side. ? A - Arms. Weakness or loss of feeling in an arm. This happens all of a sudden and most often on one side of the body. ? S - Speech. Sudden trouble speaking, slurred speech, or trouble understanding what people say. ? T - Time. Time to call emergency services. Write down what time symptoms started.  You have other signs of a stroke, such as: ? A sudden, very bad headache with no known cause. ? Feeling like you may vomit (nausea). ? Vomiting. ? A seizure.  You have chest pain or trouble breathing. These symptoms may be an emergency. Get help right away. Call your local emergency services (911 in the [place]  Do not wait to see if the symptoms will go away.  Do not drive yourself to the hospital. Summary  Peripheral vascular disease (PVD) is a disease of the blood vessels.  PVD affects the legs and feet the most.  Symptoms may include leg  pain or leg numbness, tingling, and weakness.  Treatment may include lifestyle changes, medicines, and procedures. This information is not intended to replace advice given to you by your health care provider. Make sure you discuss any questions you have with your health care provider. Document Revised: 11/20/2019 Document Reviewed: 11/20/2019 Elsevier Patient Education  2021 [employer]  

## 2020-11-05 NOTE — Telephone Encounter (Signed)
Spoke with the patient and her daughter, patient scheduled with Dr. Wyn Quaker for a left leg angio on 11/06/20 with a 10:30 am arrival time to the [facility]. Pre-procedure instructions were discussed and was handed to the patient and daughter.

## 2020-11-05 NOTE — Progress Notes (Signed)
Patient ID: Sarah Bonilla, female   DOB: 02-17-1956, 65 y.o.   MRN: [id number]  Chief Complaint  Patient presents with  . New Patient (Initial Visit)    Second opion for Lt above knee amputation    HPI Sarah Bonilla is a 65 y.o. female.  I am asked to see the patient by Dr. 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  She was [place] vascular [place] in [place] and had an angiogram performed with a subsequent attempt at a femme peroneal bypass with a left great toe amputation back in March.  Since that time, amputation has been recommended and she has been in significant pain.  Reviewing the interventions and procedures, her peroneal artery was the only vessel that reconstituted well but it was not suitable for distal bypass.  Patient does not have fever or chills or signs of systemic infection.  She does have swelling in both legs.  She is in a lot of pain and continues to be.     Past Medical History:  Diagnosis Date  . GERD (gastroesophageal reflux disease)   . Hypertension   . Peripheral arterial disease [facility](HCC)     Past Surgical History:  Procedure Laterality Date  . ABDOMINAL AORTOGRAM W/LOWER EXTREMITY Bilateral 08/16/2020   Procedure: ABDOMINAL AORTOGRAM W/LOWER EXTREMITY;  Surgeon: Sherren KernsFields, Charles E, MD;  [facility];  Service: Cardiovascular;  Laterality: Bilateral;  . AMPUTATION Left 08/20/2020   Procedure: LEFT FIRST TOE AMPUTATION;  Surgeon: Sherren KernsFields, Charles E, MD;  [facility];  Service: Vascular;  Laterality: Left;  . BYPASS GRAFT FEMORAL-PERONEAL Left 08/20/2020   Procedure: exploration left peroneal artery ;  Surgeon: Sherren KernsFields, Charles E, MD;  [facility];  Service: Vascular;  Laterality: Left;  . CHOLECYSTECTOMY    . VEIN HARVEST Left 08/20/2020    Procedure: VEIN HARVEST LEFT GREATER SAPHENOUS VEIN;  Surgeon: Sherren KernsFields, Charles E, MD;  [facility];  Service: Vascular;  Laterality: Left;     Family History  Problem Relation Age of Onset  . CVA Mother   . Hypertension Father   no bleeding or clotting disorders   Social History   Tobacco Use  . Smoking status: Current Every Day Smoker    Packs/day: 1.00    Types: Cigarettes  . Smokeless tobacco: Never Used  Vaping Use  . Vaping Use: Never used  Substance Use Topics  . Alcohol use: Yes    Comment: occasionally   . Drug use: Never    No Known Allergies  Current Outpatient Medications  Medication Sig Dispense Refill  . acetaminophen (TYLENOL) 325 MG tablet Take 650 mg by mouth every 6 (six) hours as needed for moderate pain or headache.    . albuterol (VENTOLIN HFA) 108 (90 Base) MCG/ACT inhaler Inhale 1 puff into the lungs 4 (four) times daily as needed for wheezing or shortness of breath.    Marland Kitchen. apixaban (ELIQUIS) 5 MG TABS tablet TAKE 1 TABLET (5 MG TOTAL) BY MOUTH TWO TIMES DAILY. 60 tablet 2  . carvedilol (COREG) 25 MG tablet Take 1 tablet (25 mg total) by mouth 2 (two) times daily with a meal. 60 tablet 2  . diltiazem (CARDIZEM CD) 360 MG 24 hr capsule Take 1 capsule (360 mg total) by mouth  daily. 30 capsule 1  . enoxaparin (LOVENOX) 40 MG/0.4ML injection Inject 0.4 mLs (40 mg total) into the skin every 12 (twelve) hours for 3 days. 2.4 mL 0  . furosemide (LASIX) 40 MG tablet Take 40 mg by mouth daily.    . hydrALAZINE (APRESOLINE) 100 MG tablet Take 1 tablet (100 mg total) by mouth every 8 (eight) hours. 90 tablet 1  . HYDROcodone-acetaminophen (NORCO/VICODIN) 5-325 MG tablet Take 1 tablet by mouth every 6 (six) hours as needed for severe pain. 20 tablet 0  . ibuprofen (ADVIL) 200 MG tablet Take 400 mg by mouth every 6 (six) hours as needed for mild pain.    . isosorbide mononitrate (IMDUR) 60 MG 24 hr tablet Take 1 tablet (60 mg total) by mouth daily. 30 tablet 1  .  lisinopril (ZESTRIL) 40 MG tablet Take 1 tablet (40 mg total) by mouth daily. 30 tablet 2  . losartan (COZAAR) 50 MG tablet Take 50 mg by mouth daily.    . pantoprazole (PROTONIX) 40 MG tablet Take 1 tablet (40 mg total) by mouth daily. (Patient not taking: Reported on 11/04/2020) 30 tablet 1  . potassium chloride (KLOR-CON) 10 MEQ tablet Take 10 mEq by mouth daily.    . potassium chloride SA (KLOR-CON) 20 MEQ tablet Take 1 tablet (20 mEq total) by mouth 2 (two) times daily for 10 days. 20 tablet 0   No current facility-administered medications for this visit.      REVIEW OF SYSTEMS (Negative unless checked)  Constitutional: [] Weight loss  [] Fever  [] Chills Cardiac: [] Chest pain   [] Chest pressure   [] Palpitations   [] Shortness of breath when laying flat   [] Shortness of breath at rest   [] Shortness of breath with exertion. Vascular:  [] Pain in legs with walking   [] Pain in legs at rest   [] Pain in legs when laying flat   [] Claudication   [] Pain in feet when walking  [] Pain in feet at rest  [] Pain in feet when laying flat   [] History of DVT   [] Phlebitis   [] Swelling in legs   [] Varicose veins   [x] Non-healing ulcers Pulmonary:   [] Uses home oxygen   [] Productive cough   [] Hemoptysis   [] Wheeze  [] COPD   [] Asthma Neurologic:  [] Dizziness  [] Blackouts   [] Seizures   [] History of stroke   [] History of TIA  [] Aphasia   [] Temporary blindness   [] Dysphagia   [] Weakness or numbness in arms   [] Weakness or numbness in legs Musculoskeletal:  [x] Arthritis   [] Joint swelling   [] Joint pain   [] Low back pain Hematologic:  [] Easy bruising  [] Easy bleeding   [] Hypercoagulable state   [] Anemic  [] Hepatitis Gastrointestinal:  [] Blood in stool   [] Vomiting blood  [x] Gastroesophageal reflux/heartburn   [] Abdominal pain Genitourinary:  [] Chronic kidney disease   [] Difficult urination  [] Frequent urination  [] Burning with urination   [] Hematuria Skin:  [] Rashes   [x] Ulcers   [x] Wounds Psychological:  [] History of  anxiety   []  History of major depression.    Physical Exam BP (!) 168/78   Pulse 70   Ht 5\' 2"  (1.575 m)   Wt 98 lb (44.5 kg)   BMI 17.92 kg/m  Gen: Thin, NAD Head: [place]/AT, No temporalis wasting. Ear/Nose/Throat: Hearing grossly intact, nares w/o erythema or drainage, oropharynx w/o Erythema/Exudate Eyes: Conjunctiva clear, sclera non-icteric  Neck: trachea midline.  No JVD.  Pulmonary:  Good air movement, respirations not labored, no use of accessory muscles  Cardiac: RRR, no JVD Vascular:  Vessel Right Left  Radial Palpable Palpable                          DP NP NP  PT NP NP   Gastrointestinal:. No masses, surgical incisions, or scars. Musculoskeletal: M/S 5/5 throughout.  Left foot is ischemic.  Left great toe amputation site with necrotic tissue present.  Nonhealing wound at the lower left lower leg incision medially from her previous attempted a fem to peroneal bypass.  This wound is fairly clean.  1-2+ right lower extremity edema, 2-3+ left lower extremity edema Neurologic: Sensation grossly intact in extremities.  Symmetrical.  Speech is fluent. Motor exam as listed above. Psychiatric: Judgment intact, Mood & affect appropriate for pt's clinical situation. Dermatologic: Left leg wounds as above    Radiology No results found.  Labs Recent Results (from the past 2160 hour(s))  Basic metabolic panel     Status: Abnormal   Collection Time: 08/10/20 10:07 PM  Result Value Ref Range   Sodium 142 135 - 145 mmol/L   Potassium 2.4 (LL) 3.5 - 5.1 mmol/L    Comment: CRITICAL RESULT CALLED TO, READ BACK BY AND VERIFIED WITH: J SAPPELT AT 2309 ON 08/10/2020 BY JPM     Chloride 106 98 - 111 mmol/L   CO2 25 22 - 32 mmol/L   Glucose, Bld 105 (H) 70 - 99 mg/dL    Comment: Glucose reference range applies only to samples taken after fasting for at least 8 hours.   BUN 23 8 - 23 mg/dL   Creatinine, Ser 4.09 0.44 - 1.00 mg/dL   Calcium 9.2 8.9 - 81.1 mg/dL   GFR, Estimated  >91 >47 mL/min    Comment: (NOTE) Calculated using the CKD-EPI Creatinine Equation (2021)    Anion gap 11 5 - 15    Comment: Performed at [facility], [address] [place], [place] [address]  CBC     Status: Abnormal   Collection Time: 08/10/20 10:07 PM  Result Value Ref Range   WBC 7.3 4.0 - 10.5 K/uL   RBC 3.32 (L) 3.87 - 5.11 MIL/uL   Hemoglobin 10.0 (L) 12.0 - 15.0 g/dL   HCT 21.3 (L) 08.6 - 57.8 %   MCV 91.9 80.0 - 100.0 fL   MCH 30.1 26.0 - 34.0 pg   MCHC 32.8 30.0 - 36.0 g/dL   RDW 46.9 62.9 - 52.8 %   Platelets 183 150 - 400 K/uL   nRBC 0.0 0.0 - 0.2 %    Comment: Performed at [facility], [address] [place], [place] [address]  Sedimentation rate     Status: Abnormal   Collection Time: 08/10/20 10:08 PM  Result Value Ref Range   Sed Rate 25 (H) 0 - 22 mm/hr    Comment: Performed at [facility], [address], [place], [place] [address]  C-reactive protein     Status: Abnormal   Collection Time: 08/10/20 10:08 PM  Result Value Ref Range   CRP 1.0 (H) <1.0 mg/dL    Comment: Performed at [facility], [address], [place], [place] [address]  Lactic acid, plasma     Status: None   Collection Time: 08/10/20 10:08 PM  Result Value Ref Range   Lactic Acid, Venous 1.1 0.5 - 1.9 mmol/L    Comment: Performed at [facility], [address], [place], [place] [address]  Hemoglobin A1c     Status: None   Collection Time: 08/10/20 10:08  PM  Result Value Ref Range   Hgb A1c MFr Bld 5.1 4.8 - 5.6 %    Comment: (NOTE) Pre diabetes:          5.7%-6.4%  Diabetes:              >6.4%  Glycemic control for   <7.0% adults with diabetes    Mean Plasma Glucose 99.67 mg/dL    Comment: Performed at [facility], [address], [place], [place] [address]  Lactic acid, plasma     Status: None   Collection Time: 08/10/20 11:52 PM  Result Value Ref Range   Lactic Acid, Venous 1.5 0.5 - 1.9 mmol/L    Comment: Performed at [facility], [address] [place], [place]  [address]  D-dimer, quantitative     Status: Abnormal   Collection Time: 08/10/20 11:52 PM  Result Value Ref Range   D-Dimer, Quant 0.70 (H) 0.00 - 0.50 ug/mL-FEU    Comment: (NOTE) At the manufacturer cut-off value of 0.5 g/mL FEU, this assay has a negative predictive value of 95-100%.This assay is intended for use in conjunction with a clinical pretest probability (PTP) assessment model to exclude pulmonary embolism (PE) and deep venous thrombosis (DVT) in outpatients suspected of PE or DVT. Results should be correlated with clinical presentation. Performed at [facility], [address] [place], [place] [address]   Brain natriuretic peptide     Status: Abnormal   Collection Time: 08/10/20 11:52 PM  Result Value Ref Range   B Natriuretic Peptide 2,259.0 (H) 0.0 - 100.0 pg/mL    Comment: Performed at [facility], [address] [place], [place] [address]  Magnesium     Status: None   Collection Time: 08/10/20 11:52 PM  Result Value Ref Range   Magnesium 1.7 1.7 - 2.4 mg/dL    Comment: Performed at [facility], [address], [place], [place] [address]  Blood culture (routine x 2)     Status: None   Collection Time: 08/10/20 11:53 PM   Specimen: BLOOD RIGHT FOREARM  Result Value Ref Range   Specimen Description BLOOD RIGHT FOREARM    Special Requests      BOTTLES DRAWN AEROBIC AND ANAEROBIC Blood Culture adequate volume   Culture      NO GROWTH 5 DAYS Performed at [facility], [address], [place], [place] [address]    Report Status 08/16/2020 FINAL   Blood culture (routine x 2)     Status: None   Collection Time: 08/11/20 12:00 AM   Specimen: BLOOD RIGHT ARM  Result Value Ref Range   Specimen Description BLOOD RIGHT ARM    Special Requests      BOTTLES DRAWN AEROBIC AND ANAEROBIC Blood Culture results may not be optimal due to an excessive volume of blood received in culture bottles   Culture      NO GROWTH 5 DAYS Performed at [facility], [address]  [place], [place] [address]    Report Status 08/16/2020 FINAL   Resp Panel by RT-PCR (Flu A&B, Covid) Nasopharyngeal Swab     Status: None   Collection Time: 08/11/20  1:07 AM   Specimen: Nasopharyngeal Swab; Nasopharyngeal(NP) swabs in vial transport medium  Result Value Ref Range   SARS Coronavirus 2 by RT PCR NEGATIVE NEGATIVE    Comment: (NOTE) SARS-CoV-2 target nucleic acids are NOT DETECTED.  The SARS-CoV-2 RNA is generally detectable in upper respiratory specimens during the acute phase of infection. The lowest concentration  of SARS-CoV-2 viral copies this assay can detect is 138 copies/mL. A negative result does not preclude SARS-Cov-2 infection and should not be used as the sole basis for treatment or other patient management decisions. A negative result may occur with  improper specimen collection/handling, submission of specimen other than nasopharyngeal swab, presence of viral mutation(s) within the areas targeted by this assay, and inadequate number of viral copies(<138 copies/mL). A negative result must be combined with clinical observations, patient history, and epidemiological information. The expected result is Negative.  Fact Sheet for Patients:  BloggerCourse.com  Fact Sheet for Healthcare Providers:  SeriousBroker.it  This test is no t yet approved or cleared by the [place] FDA and  has been authorized for detection and/or diagnosis of SARS-CoV-2 by FDA under an Emergency Use Authorization (EUA). This EUA will remain  in effect (meaning this test can be used) for the duration of the COVID-19 declaration under Section 564(b)(1) of the Act, 21 U.S.C.section 360bbb-3(b)(1), unless the authorization is terminated  or revoked sooner.       Influenza A by PCR NEGATIVE NEGATIVE   Influenza B by PCR NEGATIVE NEGATIVE    Comment: (NOTE) The Xpert Xpress SARS-CoV-2/FLU/RSV plus assay is intended as an aid in the  diagnosis of influenza from Nasopharyngeal swab specimens and should not be used as a sole basis for treatment. Nasal washings and aspirates are unacceptable for Xpert Xpress SARS-CoV-2/FLU/RSV testing.  Fact Sheet for Patients: BloggerCourse.com  Fact Sheet for Healthcare Providers: SeriousBroker.it  This test is not yet approved or cleared by the [place] FDA and has been authorized for detection and/or diagnosis of SARS-CoV-2 by FDA under an Emergency Use Authorization (EUA). This EUA will remain in effect (meaning this test can be used) for the duration of the COVID-19 declaration under Section 564(b)(1) of the Act, 21 U.S.C. section 360bbb-3(b)(1), unless the authorization is terminated or revoked.  Performed at [facility], [address], [place], [place] [address]   Basic metabolic panel     Status: Abnormal   Collection Time: 08/11/20  5:52 AM  Result Value Ref Range   Sodium 142 135 - 145 mmol/L   Potassium 2.5 (LL) 3.5 - 5.1 mmol/L    Comment: CRITICAL RESULT CALLED TO, READ BACK BY AND VERIFIED WITH: J SAPPELT @6051  by matthews, b 3.13.22    Chloride 101 98 - 111 mmol/L   CO2 29 22 - 32 mmol/L   Glucose, Bld 102 (H) 70 - 99 mg/dL    Comment: Glucose reference range applies only to samples taken after fasting for at least 8 hours.   BUN 18 8 - 23 mg/dL   Creatinine, Ser 6.04 0.44 - 1.00 mg/dL   Calcium 9.0 8.9 - 54.0 mg/dL   GFR, Estimated >98 >11 mL/min    Comment: (NOTE) Calculated using the CKD-EPI Creatinine Equation (2021)    Anion gap 12 5 - 15    Comment: Performed at [facility], [address], [place], [place] [address]  MRSA PCR Screening     Status: None   Collection Time: 08/11/20  9:12 AM   Specimen: Nasal Mucosa; Nasopharyngeal  Result Value Ref Range   MRSA by PCR NEGATIVE NEGATIVE    Comment:        The GeneXpert MRSA Assay (FDA approved for NASAL specimens only), is one component of  a comprehensive MRSA colonization surveillance program. It is not intended to diagnose MRSA infection nor to guide or monitor treatment for MRSA infections. Performed at  [facility], [address] [place], [place] [address]   Urine rapid drug screen (hosp performed)     Status: None   Collection Time: 08/11/20 11:48 AM  Result Value Ref Range   Opiates NONE DETECTED NONE DETECTED   Cocaine NONE DETECTED NONE DETECTED   Benzodiazepines NONE DETECTED NONE DETECTED   Amphetamines NONE DETECTED NONE DETECTED   Tetrahydrocannabinol NONE DETECTED NONE DETECTED   Barbiturates NONE DETECTED NONE DETECTED    Comment: (NOTE) DRUG SCREEN FOR MEDICAL PURPOSES ONLY.  IF CONFIRMATION IS NEEDED FOR ANY PURPOSE, NOTIFY LAB WITHIN 5 DAYS.  LOWEST DETECTABLE LIMITS FOR URINE DRUG SCREEN Drug Class                     Cutoff (ng/mL) Amphetamine and metabolites    1000 Barbiturate and metabolites    200 Benzodiazepine                 200 Tricyclics and metabolites     300 Opiates and metabolites        300 Cocaine and metabolites        300 THC                            50 Performed at [facility], [address], [place], [place] [address]   Heparin level (unfractionated)     Status: Abnormal   Collection Time: 08/11/20  2:46 PM  Result Value Ref Range   Heparin Unfractionated <0.10 (L) 0.30 - 0.70 IU/mL    Comment: (NOTE) If heparin results are below expected values, and patient dosage has  been confirmed, suggest follow up testing of antithrombin III levels. Performed at [facility], [address], [place], [place] [address]   Heparin level (unfractionated)     Status: Abnormal   Collection Time: 08/11/20 10:20 PM  Result Value Ref Range   Heparin Unfractionated 0.21 (L) 0.30 - 0.70 IU/mL    Comment: (NOTE) If heparin results are below expected values, and patient dosage has  been confirmed, suggest follow up testing of antithrombin III levels. Performed at [facility],  [address] [place], [place] [address]   Heparin level (unfractionated)     Status: None   Collection Time: 08/12/20  8:02 AM  Result Value Ref Range   Heparin Unfractionated 0.38 0.30 - 0.70 IU/mL    Comment: (NOTE) If heparin results are below expected values, and patient dosage has  been confirmed, suggest follow up testing of antithrombin III levels. Performed at [facility], [address], [place], [place] [address]   Basic metabolic panel     Status: Abnormal   Collection Time: 08/12/20  8:02 AM  Result Value Ref Range   Sodium 144 135 - 145 mmol/L   Potassium 3.2 (L) 3.5 - 5.1 mmol/L    Comment: DELTA CHECK NOTED   Chloride 109 98 - 111 mmol/L   CO2 23 22 - 32 mmol/L   Glucose, Bld 110 (H) 70 - 99 mg/dL    Comment: Glucose reference range applies only to samples taken after fasting for at least 8 hours.   BUN 18 8 - 23 mg/dL   Creatinine, Ser 1.85 0.44 - 1.00 mg/dL   Calcium 9.2 8.9 - 63.1 mg/dL   GFR, Estimated >49 >70 mL/min    Comment: (NOTE) Calculated using the CKD-EPI Creatinine Equation (2021)    Anion gap 12 5 - 15    Comment:  Performed at [facility], [address], [place], [place] [address]  CBC     Status: Abnormal   Collection Time: 08/12/20  8:02 AM  Result Value Ref Range   WBC 12.0 (H) 4.0 - 10.5 K/uL   RBC 3.15 (L) 3.87 - 5.11 MIL/uL   Hemoglobin 9.3 (L) 12.0 - 15.0 g/dL   HCT 92.4 (L) 26.8 - 34.1 %   MCV 93.7 80.0 - 100.0 fL   MCH 29.5 26.0 - 34.0 pg   MCHC 31.5 30.0 - 36.0 g/dL   RDW 96.2 (H) 22.9 - 79.8 %   Platelets 176 150 - 400 K/uL   nRBC 0.0 0.0 - 0.2 %    Comment: Performed at [facility], [address], [place], [place] [address]  Magnesium     Status: None   Collection Time: 08/12/20  8:02 AM  Result Value Ref Range   Magnesium 1.7 1.7 - 2.4 mg/dL    Comment: Performed at [facility], [address] [place], [place] [address]  ECHOCARDIOGRAM COMPLETE     Status: None   Collection Time: 08/12/20  2:24 PM  Result Value Ref Range    Weight 1,576 oz   Height 62 in   BP 171/85 mmHg   Area-P 1/2 3.46 cm2   S' Lateral 2.60 cm   MV M vel 5.84 m/s   MV Peak grad 136.4 mmHg   Radius 0.60 cm  Heparin level (unfractionated)     Status: None   Collection Time: 08/12/20  3:27 PM  Result Value Ref Range   Heparin Unfractionated 0.32 0.30 - 0.70 IU/mL    Comment: (NOTE) If heparin results are below expected values, and patient dosage has  been confirmed, suggest follow up testing of antithrombin III levels. Performed at [facility], [address], [place], [place] [address]   Heparin level (unfractionated)     Status: Abnormal   Collection Time: 08/13/20  2:19 AM  Result Value Ref Range   Heparin Unfractionated 0.23 (L) 0.30 - 0.70 IU/mL    Comment: (NOTE) If heparin results are below expected values, and patient dosage has  been confirmed, suggest follow up testing of antithrombin III levels. Performed at [facility], [address], [place], [place] [address]   CBC     Status: Abnormal   Collection Time: 08/13/20  2:19 AM  Result Value Ref Range   WBC 10.5 4.0 - 10.5 K/uL   RBC 3.14 (L) 3.87 - 5.11 MIL/uL   Hemoglobin 9.3 (L) 12.0 - 15.0 g/dL   HCT 14.9 (L) 70.2 - 63.7 %   MCV 92.4 80.0 - 100.0 fL   MCH 29.6 26.0 - 34.0 pg   MCHC 32.1 30.0 - 36.0 g/dL   RDW 85.8 (H) 85.0 - 27.7 %   Platelets 170 150 - 400 K/uL   nRBC 0.0 0.0 - 0.2 %    Comment: Performed at [facility], [address], [place], [place] [address]  Basic metabolic panel     Status: Abnormal   Collection Time: 08/13/20  2:19 AM  Result Value Ref Range   Sodium 143 135 - 145 mmol/L   Potassium 3.4 (L) 3.5 - 5.1 mmol/L   Chloride 108 98 - 111 mmol/L   CO2 25 22 - 32 mmol/L   Glucose, Bld 115 (H) 70 - 99 mg/dL    Comment: Glucose reference range applies only to samples taken after fasting for at least 8 hours.   BUN  19 8 - 23 mg/dL   Creatinine, Ser 0.98 0.44 - 1.00 mg/dL   Calcium 9.4 8.9 - 11.9 mg/dL   GFR, Estimated >14 >78  mL/min    Comment: (NOTE) Calculated using the CKD-EPI Creatinine Equation (2021)    Anion gap 10 5 - 15    Comment: Performed at [facility], [address] [place], [place] [address]  Magnesium     Status: None   Collection Time: 08/13/20  2:19 AM  Result Value Ref Range   Magnesium 1.7 1.7 - 2.4 mg/dL    Comment: Performed at [facility], [address], [place], [place] [address]  Heparin level (unfractionated)     Status: None   Collection Time: 08/13/20 11:00 AM  Result Value Ref Range   Heparin Unfractionated 0.32 0.30 - 0.70 IU/mL    Comment: (NOTE) If heparin results are below expected values, and patient dosage has  been confirmed, suggest follow up testing of antithrombin III levels. Performed at [facility], [address] [place], [place] [address]   Heparin level (unfractionated)     Status: Abnormal   Collection Time: 08/14/20  3:43 AM  Result Value Ref Range   Heparin Unfractionated 0.29 (L) 0.30 - 0.70 IU/mL    Comment: (NOTE) If heparin results are below expected values, and patient dosage has  been confirmed, suggest follow up testing of antithrombin III levels. Performed at [facility], [address] [place], [place] [address]   CBC     Status: Abnormal   Collection Time: 08/14/20  3:43 AM  Result Value Ref Range   WBC 11.3 (H) 4.0 - 10.5 K/uL   RBC 2.96 (L) 3.87 - 5.11 MIL/uL   Hemoglobin 8.7 (L) 12.0 - 15.0 g/dL   HCT 28.4 (L) 13.2 - 44.0 %   MCV 91.9 80.0 - 100.0 fL   MCH 29.4 26.0 - 34.0 pg   MCHC 32.0 30.0 - 36.0 g/dL   RDW 10.2 (H) 72.5 - 36.6 %   Platelets 169 150 - 400 K/uL   nRBC 0.0 0.0 - 0.2 %    Comment: Performed at [facility], [address] [place], [place] [address]  Basic metabolic panel     Status: Abnormal   Collection Time: 08/14/20  3:43 AM  Result Value Ref Range   Sodium 141 135 - 145 mmol/L   Potassium 3.9 3.5 - 5.1 mmol/L   Chloride 109 98 - 111 mmol/L   CO2 23 22 - 32 mmol/L    Glucose, Bld 103 (H) 70 - 99 mg/dL    Comment: Glucose reference range applies only to samples taken after fasting for at least 8 hours.   BUN 17 8 - 23 mg/dL   Creatinine, Ser 7.42 0.44 - 1.00 mg/dL   Calcium 9.2 8.9 - 59.5 mg/dL   GFR, Estimated >63 >87 mL/min    Comment: (NOTE) Calculated using the CKD-EPI Creatinine Equation (2021)    Anion gap 9 5 - 15    Comment: Performed at [facility], [address], [place], [place] [address]  Magnesium     Status: None   Collection Time: 08/14/20  3:43 AM  Result Value Ref Range   Magnesium 2.1 1.7 - 2.4 mg/dL    Comment: Performed at [facility], [address] [place], [place] [address]  C-reactive protein     Status: Abnormal   Collection Time: 08/14/20  3:43  AM  Result Value Ref Range   CRP 1.9 (H) <1.0 mg/dL    Comment: Performed at [facility], [address], [place], [place] [address]  Heparin level (unfractionated)     Status: None   Collection Time: 08/15/20  2:19 AM  Result Value Ref Range   Heparin Unfractionated 0.35 0.30 - 0.70 IU/mL    Comment: (NOTE) If heparin results are below expected values, and patient dosage has  been confirmed, suggest follow up testing of antithrombin III levels. Performed at [facility], [address], [place], [place] [address]   CBC     Status: Abnormal   Collection Time: 08/15/20  2:19 AM  Result Value Ref Range   WBC 11.3 (H) 4.0 - 10.5 K/uL   RBC 2.72 (L) 3.87 - 5.11 MIL/uL   Hemoglobin 8.0 (L) 12.0 - 15.0 g/dL   HCT 09.8 (L) 11.9 - 14.7 %   MCV 93.8 80.0 - 100.0 fL   MCH 29.4 26.0 - 34.0 pg   MCHC 31.4 30.0 - 36.0 g/dL   RDW 82.9 (H) 56.2 - 13.0 %   Platelets 167 150 - 400 K/uL   nRBC 0.0 0.0 - 0.2 %    Comment: Performed at [facility], [address], [place], [place] [address]  Occult blood card to lab, stool     Status: None   Collection Time: 08/15/20  4:01 PM  Result Value Ref Range   Fecal Occult Bld NEGATIVE NEGATIVE    Comment:  Performed at [facility], [address] [place], [place] [address]  Heparin level (unfractionated)     Status: Abnormal   Collection Time: 08/16/20  2:51 AM  Result Value Ref Range   Heparin Unfractionated 0.28 (L) 0.30 - 0.70 IU/mL    Comment: (NOTE) If heparin results are below expected values, and patient dosage has  been confirmed, suggest follow up testing of antithrombin III levels. Performed at [facility], [address] [place], [place] [address]   CBC     Status: Abnormal   Collection Time: 08/16/20  2:51 AM  Result Value Ref Range   WBC 9.7 4.0 - 10.5 K/uL   RBC 2.83 (L) 3.87 - 5.11 MIL/uL   Hemoglobin 8.4 (L) 12.0 - 15.0 g/dL   HCT 13.2 (L) 44.0 - 10.2 %   MCV 92.2 80.0 - 100.0 fL   MCH 29.7 26.0 - 34.0 pg   MCHC 32.2 30.0 - 36.0 g/dL   RDW 72.5 (H) 36.6 - 44.0 %   Platelets 172 150 - 400 K/uL   nRBC 0.0 0.0 - 0.2 %    Comment: Performed at [facility], [address], [place], [place] [address]  Basic metabolic panel     Status: Abnormal   Collection Time: 08/16/20  2:51 AM  Result Value Ref Range   Sodium 140 135 - 145 mmol/L   Potassium 3.6 3.5 - 5.1 mmol/L   Chloride 109 98 - 111 mmol/L   CO2 22 22 - 32 mmol/L   Glucose, Bld 101 (H) 70 - 99 mg/dL    Comment: Glucose reference range applies only to samples taken after fasting for at least 8 hours.   BUN 14 8 - 23 mg/dL   Creatinine, Ser 5.95 0.44 - 1.00 mg/dL   Calcium 9.1 8.9 - 63.8 mg/dL   GFR, Estimated >75 >64 mL/min    Comment: (NOTE) Calculated using the CKD-EPI Creatinine Equation (  2021)    Anion gap 9 5 - 15    Comment: Performed at [facility], [address] [place], [place] [address]  Magnesium     Status: None   Collection Time: 08/16/20  2:51 AM  Result Value Ref Range   Magnesium 1.9 1.7 - 2.4 mg/dL    Comment: Performed at [facility], [address], [place], [place] [address]  Surgical PCR screen     Status: None   Collection Time: 08/16/20  7:43  AM   Specimen: Nasal Mucosa; Nasal Swab  Result Value Ref Range   MRSA, PCR NEGATIVE NEGATIVE   Staphylococcus aureus NEGATIVE NEGATIVE    Comment: (NOTE) The Xpert SA Assay (FDA approved for NASAL specimens in patients 96 years of age and older), is one component of a comprehensive surveillance program. It is not intended to diagnose infection nor to guide or monitor treatment. Performed at [facility], [address], [place], [place] [address]   Heparin level (unfractionated)     Status: Abnormal   Collection Time: 08/17/20 12:41 AM  Result Value Ref Range   Heparin Unfractionated 0.23 (L) 0.30 - 0.70 IU/mL    Comment: (NOTE) If heparin results are below expected values, and patient dosage has  been confirmed, suggest follow up testing of antithrombin III levels. Performed at [facility], [address], [place], [place] [address]   Vancomycin, peak     Status: Abnormal   Collection Time: 08/17/20 12:41 AM  Result Value Ref Range   Vancomycin Pk 24 (L) 30 - 40 ug/mL    Comment: Performed at [facility], [address], [place], [place] [address]  Basic metabolic panel     Status: Abnormal   Collection Time: 08/17/20 12:41 AM  Result Value Ref Range   Sodium 139 135 - 145 mmol/L   Potassium 3.0 (L) 3.5 - 5.1 mmol/L   Chloride 112 (H) 98 - 111 mmol/L   CO2 20 (L) 22 - 32 mmol/L   Glucose, Bld 96 70 - 99 mg/dL    Comment: Glucose reference range applies only to samples taken after fasting for at least 8 hours.   BUN 11 8 - 23 mg/dL   Creatinine, Ser 7.61 0.44 - 1.00 mg/dL   Calcium 8.8 (L) 8.9 - 10.3 mg/dL   GFR, Estimated >60 >73 mL/min    Comment: (NOTE) Calculated using the CKD-EPI Creatinine Equation (2021)    Anion gap 7 5 - 15    Comment: Performed at [facility], [address], [place], [place] [address]  CBC     Status: Abnormal   Collection Time: 08/17/20 12:41 AM  Result Value Ref Range   WBC 8.6 4.0 - 10.5 K/uL   RBC 2.78 (L)  3.87 - 5.11 MIL/uL   Hemoglobin 8.2 (L) 12.0 - 15.0 g/dL   HCT 69.4 (L) 85.4 - 62.7 %   MCV 90.6 80.0 - 100.0 fL   MCH 29.5 26.0 - 34.0 pg   MCHC 32.5 30.0 - 36.0 g/dL   RDW 03.5 (H) 00.9 - 38.1 %   Platelets 193 150 - 400 K/uL   nRBC 0.0 0.0 - 0.2 %    Comment: Performed at [facility], [address], [place], [place] [address]  Heparin level (unfractionated)     Status: None   Collection Time: 08/17/20  7:47 AM  Result Value Ref Range   Heparin Unfractionated 0.37  0.30 - 0.70 IU/mL    Comment: (NOTE) If heparin results are below expected values, and patient dosage has  been confirmed, suggest follow up testing of antithrombin III levels. Performed at [facility], [address] [place], [place] [address]   Aldosterone + renin activity w/ ratio     Status: Abnormal   Collection Time: 08/17/20  2:06 PM  Result Value Ref Range   PRA LC/MS/MS <0.167 (L) 0.167 - 5.380 ng/mL/hr    Comment: (NOTE) This test was developed and its performance characteristics determined by [facility]. It has not been cleared or approved by the Food and Drug Administration. Performed At: [facility] [address] [place], [place] [id number] Jolene Schimke MD [id number]    ALDO / PRA Ratio UNABLE TO CALCULATE     Comment: (NOTE) Unable to calculate result since non-numeric result obtained for component test.                         Units:      ng/dL per ng/mL/hr    Aldosterone <1.0 0.0 - 30.0 ng/dL    Comment: (NOTE) This test was developed and its performance characteristics determined by [facility]. It has not been cleared or approved by the Food and Drug Administration.   Vancomycin, trough     Status: Abnormal   Collection Time: 08/17/20  8:53 PM  Result Value Ref Range   Vancomycin Tr 9 (L) 15 - 20 ug/mL    Comment: Performed at [facility], [address] [place], [place] [address]  Basic metabolic panel     Status: Abnormal   Collection Time: 08/18/20   2:05 AM  Result Value Ref Range   Sodium 139 135 - 145 mmol/L   Potassium 3.3 (L) 3.5 - 5.1 mmol/L   Chloride 109 98 - 111 mmol/L   CO2 20 (L) 22 - 32 mmol/L   Glucose, Bld 119 (H) 70 - 99 mg/dL    Comment: Glucose reference range applies only to samples taken after fasting for at least 8 hours.   BUN 11 8 - 23 mg/dL   Creatinine, Ser 7.84 0.44 - 1.00 mg/dL   Calcium 8.8 (L) 8.9 - 10.3 mg/dL   GFR, Estimated >69 >62 mL/min    Comment: (NOTE) Calculated using the CKD-EPI Creatinine Equation (2021)    Anion gap 10 5 - 15    Comment: Performed at [facility], [address] [place], [place] [address]  Heparin level (unfractionated)     Status: None   Collection Time: 08/18/20  2:05 AM  Result Value Ref Range   Heparin Unfractionated 0.49 0.30 - 0.70 IU/mL    Comment: (NOTE) If heparin results are below expected values, and patient dosage has  been confirmed, suggest follow up testing of antithrombin III levels. Performed at [facility], [address] [place], [place] [address]   CBC     Status: Abnormal   Collection Time: 08/18/20  2:05 AM  Result Value Ref Range   WBC 9.8 4.0 - 10.5 K/uL   RBC 2.77 (L) 3.87 - 5.11 MIL/uL   Hemoglobin 8.3 (L) 12.0 - 15.0 g/dL   HCT 01.0 (L) 27.2 - 53.6 %   MCV 91.7 80.0 - 100.0 fL   MCH 30.0 26.0 - 34.0 pg   MCHC 32.7 30.0 - 36.0 g/dL   RDW 64.4 (H) 03.4 - 74.2 %   Platelets 208 150 - 400  K/uL   nRBC 0.0 0.0 - 0.2 %    Comment: Performed at [facility], [address] [place], [place] [address]  Magnesium     Status: None   Collection Time: 08/18/20  2:05 AM  Result Value Ref Range   Magnesium 1.9 1.7 - 2.4 mg/dL    Comment: Performed at [facility], [address] [place], [place] [address]  Heparin level (unfractionated)     Status: None   Collection Time: 08/19/20  4:52 AM  Result Value Ref Range   Heparin Unfractionated 0.47 0.30 - 0.70 IU/mL    Comment: (NOTE) If heparin results are below expected  values, and patient dosage has  been confirmed, suggest follow up testing of antithrombin III levels. Performed at [facility], [address] [place], [place] [address]   CBC     Status: Abnormal   Collection Time: 08/19/20  4:52 AM  Result Value Ref Range   WBC 9.2 4.0 - 10.5 K/uL   RBC 2.88 (L) 3.87 - 5.11 MIL/uL   Hemoglobin 8.4 (L) 12.0 - 15.0 g/dL   HCT 91.4 (L) 78.2 - 95.6 %   MCV 91.7 80.0 - 100.0 fL   MCH 29.2 26.0 - 34.0 pg   MCHC 31.8 30.0 - 36.0 g/dL   RDW 21.3 (H) 08.6 - 57.8 %   Platelets 207 150 - 400 K/uL   nRBC 0.0 0.0 - 0.2 %    Comment: Performed at [facility], [address] [place], [place] [address]  Basic metabolic panel     Status: Abnormal   Collection Time: 08/19/20  4:52 AM  Result Value Ref Range   Sodium 140 135 - 145 mmol/L   Potassium 3.3 (L) 3.5 - 5.1 mmol/L   Chloride 113 (H) 98 - 111 mmol/L   CO2 19 (L) 22 - 32 mmol/L   Glucose, Bld 102 (H) 70 - 99 mg/dL    Comment: Glucose reference range applies only to samples taken after fasting for at least 8 hours.   BUN 10 8 - 23 mg/dL   Creatinine, Ser 9.52 0.44 - 1.00 mg/dL   Calcium 8.9 8.9 - 84.1 mg/dL   GFR, Estimated >32 >44 mL/min    Comment: (NOTE) Calculated using the CKD-EPI Creatinine Equation (2021)    Anion gap 8 5 - 15    Comment: Performed at [facility], [address], [place], [place] [address]  Heparin level (unfractionated)     Status: None   Collection Time: 08/20/20  1:33 AM  Result Value Ref Range   Heparin Unfractionated 0.45 0.30 - 0.70 IU/mL    Comment: (NOTE) If heparin results are below expected values, and patient dosage has  been confirmed, suggest follow up testing of antithrombin III levels. Performed at [facility], [address], [place], [place] [address]   CBC     Status: Abnormal   Collection Time: 08/20/20  1:33 AM  Result Value Ref Range   WBC 8.1 4.0 - 10.5 K/uL   RBC 2.81 (L) 3.87 - 5.11 MIL/uL   Hemoglobin 8.5 (L) 12.0  - 15.0 g/dL   HCT 44.0 (L) 34.7 - 42.5 %   MCV 90.7 80.0 - 100.0 fL   MCH 30.2 26.0 - 34.0 pg   MCHC 33.3 30.0 - 36.0 g/dL   RDW 95.6 (H) 38.7 - 56.4 %   Platelets 208 150 - 400 K/uL   nRBC 0.0  0.0 - 0.2 %    Comment: Performed at [facility], [address] [place], [place] [address]  Magnesium     Status: None   Collection Time: 08/20/20  1:33 AM  Result Value Ref Range   Magnesium 1.8 1.7 - 2.4 mg/dL    Comment: Performed at [facility], [address], [place], [place] [address]  Phosphorus     Status: None   Collection Time: 08/20/20  1:33 AM  Result Value Ref Range   Phosphorus 3.6 2.5 - 4.6 mg/dL    Comment: Performed at [facility], [address], [place], [place] [address]  Basic metabolic panel     Status: Abnormal   Collection Time: 08/20/20  1:33 AM  Result Value Ref Range   Sodium 140 135 - 145 mmol/L   Potassium 3.3 (L) 3.5 - 5.1 mmol/L   Chloride 113 (H) 98 - 111 mmol/L   CO2 19 (L) 22 - 32 mmol/L   Glucose, Bld 91 70 - 99 mg/dL    Comment: Glucose reference range applies only to samples taken after fasting for at least 8 hours.   BUN 11 8 - 23 mg/dL   Creatinine, Ser 6.57 0.44 - 1.00 mg/dL   Calcium 8.8 (L) 8.9 - 10.3 mg/dL   GFR, Estimated >84 >69 mL/min    Comment: (NOTE) Calculated using the CKD-EPI Creatinine Equation (2021)    Anion gap 8 5 - 15    Comment: Performed at [facility], [address], [place], [place] [address]  Heparin level (unfractionated)     Status: None   Collection Time: 08/21/20  1:14 AM  Result Value Ref Range   Heparin Unfractionated 0.35 0.30 - 0.70 IU/mL    Comment: (NOTE) If heparin results are below expected values, and patient dosage has  been confirmed, suggest follow up testing of antithrombin III levels. Performed at [facility], [address] [place], [place] [address]   CBC     Status: Abnormal   Collection Time: 08/21/20  1:14 AM  Result Value Ref Range   WBC 8.6 4.0 - 10.5  K/uL   RBC 2.93 (L) 3.87 - 5.11 MIL/uL   Hemoglobin 8.5 (L) 12.0 - 15.0 g/dL   HCT 44.0 (L) 10.2 - 72.5 %   MCV 93.9 80.0 - 100.0 fL   MCH 29.0 26.0 - 34.0 pg   MCHC 30.9 30.0 - 36.0 g/dL   RDW 36.6 (H) 44.0 - 34.7 %   Platelets 232 150 - 400 K/uL   nRBC 0.0 0.0 - 0.2 %    Comment: Performed at [facility], [address], [place], [place] [address]  Basic metabolic panel     Status: Abnormal   Collection Time: 08/21/20  1:14 AM  Result Value Ref Range   Sodium 136 135 - 145 mmol/L   Potassium 3.7 3.5 - 5.1 mmol/L   Chloride 111 98 - 111 mmol/L   CO2 17 (L) 22 - 32 mmol/L   Glucose, Bld 140 (H) 70 - 99 mg/dL    Comment: Glucose reference range applies only to samples taken after fasting for at least 8 hours.   BUN 15 8 - 23 mg/dL   Creatinine, Ser 6.38 0.44 - 1.00 mg/dL   Calcium 8.5 (L) 8.9 - 10.3 mg/dL   GFR, Estimated >75 >64 mL/min    Comment: (NOTE) Calculated using the CKD-EPI Creatinine Equation (2021)    Anion  gap 8 5 - 15    Comment: Performed at [facility], [address], [place], [place] [address]  Magnesium     Status: None   Collection Time: 08/21/20  1:14 AM  Result Value Ref Range   Magnesium 1.7 1.7 - 2.4 mg/dL    Comment: Performed at [facility], [address], [place], [place] [address]  Phosphorus     Status: None   Collection Time: 08/21/20  1:14 AM  Result Value Ref Range   Phosphorus 3.8 2.5 - 4.6 mg/dL    Comment: Performed at [facility], [address] [place], [place] [address]  Lactic acid, plasma     Status: None   Collection Time: 10/30/20  3:16 PM  Result Value Ref Range   Lactic Acid, Venous 1.4 0.5 - 1.9 mmol/L    Comment: Performed at [facility], [address] [place], [place] [address]  Comprehensive metabolic panel     Status: Abnormal   Collection Time: 10/30/20  3:16 PM  Result Value Ref Range   Sodium 143 135 - 145 mmol/L   Potassium 2.7 (LL) 3.5 - 5.1 mmol/L    Comment: CRITICAL  RESULT CALLED TO, READ BACK BY AND VERIFIED WITH STEPHANIE RUDD AT 1623 10/30/20 DAS    Chloride 103 98 - 111 mmol/L   CO2 29 22 - 32 mmol/L   Glucose, Bld 112 (H) 70 - 99 mg/dL    Comment: Glucose reference range applies only to samples taken after fasting for at least 8 hours.   BUN 17 8 - 23 mg/dL   Creatinine, Ser 3.87 0.44 - 1.00 mg/dL   Calcium 9.0 8.9 - 56.4 mg/dL   Total Protein 7.2 6.5 - 8.1 g/dL   Albumin 3.1 (L) 3.5 - 5.0 g/dL   AST 18 15 - 41 U/L   ALT 16 0 - 44 U/L   Alkaline Phosphatase 90 38 - 126 U/L   Total Bilirubin 0.6 0.3 - 1.2 mg/dL   GFR, Estimated >33 >29 mL/min    Comment: (NOTE) Calculated using the CKD-EPI Creatinine Equation (2021)    Anion gap 11 5 - 15    Comment: Performed at [facility], [address] [place], [place] [address]  CBC with Differential     Status: Abnormal   Collection Time: 10/30/20  3:16 PM  Result Value Ref Range   WBC 8.4 4.0 - 10.5 K/uL   RBC 3.54 (L) 3.87 - 5.11 MIL/uL   Hemoglobin 9.3 (L) 12.0 - 15.0 g/dL   HCT 16.6 (L) 06.3 - 01.6 %   MCV 83.6 80.0 - 100.0 fL   MCH 26.3 26.0 - 34.0 pg   MCHC 31.4 30.0 - 36.0 g/dL   RDW 01.0 (H) 93.2 - 35.5 %   Platelets 242 150 - 400 K/uL   nRBC 0.0 0.0 - 0.2 %   Neutrophils Relative % 59 %   Neutro Abs 4.9 1.7 - 7.7 K/uL   Lymphocytes Relative 31 %   Lymphs Abs 2.6 0.7 - 4.0 K/uL   Monocytes Relative 9 %   Monocytes Absolute 0.7 0.1 - 1.0 K/uL   Eosinophils Relative 1 %   Eosinophils Absolute 0.1 0.0 - 0.5 K/uL   Basophils Relative 0 %   Basophils Absolute 0.0 0.0 - 0.1 K/uL   Immature Granulocytes 0 %   Abs Immature Granulocytes 0.03 0.00 - 0.07 K/uL    Comment: Performed at [facility], [address] [place], [place] [address]  Urinalysis, Complete w Microscopic Urine, Random     Status: Abnormal   Collection Time: 10/30/20  5:13 PM  Result Value Ref Range   Color, Urine STRAW (A) YELLOW   APPearance CLEAR (A) CLEAR   Specific Gravity, Urine 1.008  1.005 - 1.030   pH 5.0 5.0 - 8.0   Glucose, UA NEGATIVE NEGATIVE mg/dL   Hgb urine dipstick SMALL (A) NEGATIVE   Bilirubin Urine NEGATIVE NEGATIVE   Ketones, ur NEGATIVE NEGATIVE mg/dL   Protein, ur NEGATIVE NEGATIVE mg/dL   Nitrite NEGATIVE NEGATIVE   Leukocytes,Ua TRACE (A) NEGATIVE   RBC / HPF 0-5 0 - 5 RBC/hpf   WBC, UA 6-10 0 - 5 WBC/hpf   Bacteria, UA RARE (A) NONE SEEN   Squamous Epithelial / LPF 0-5 0 - 5    Comment: Performed at [facility], [address] [place], [place] [address]  CBC with Differential     Status: Abnormal   Collection Time: 11/04/20  2:39 PM  Result Value Ref Range   WBC 9.6 4.0 - 10.5 K/uL   RBC 3.74 (L) 3.87 - 5.11 MIL/uL   Hemoglobin 9.8 (L) 12.0 - 15.0 g/dL   HCT 09.8 (L) 11.9 - 14.7 %   MCV 83.4 80.0 - 100.0 fL   MCH 26.2 26.0 - 34.0 pg   MCHC 31.4 30.0 - 36.0 g/dL   RDW 82.9 (H) 56.2 - 13.0 %   Platelets 234 150 - 400 K/uL   nRBC 0.0 0.0 - 0.2 %   Neutrophils Relative % 70 %   Neutro Abs 6.7 1.7 - 7.7 K/uL   Lymphocytes Relative 22 %   Lymphs Abs 2.1 0.7 - 4.0 K/uL   Monocytes Relative 7 %   Monocytes Absolute 0.7 0.1 - 1.0 K/uL   Eosinophils Relative 1 %   Eosinophils Absolute 0.1 0.0 - 0.5 K/uL   Basophils Relative 0 %   Basophils Absolute 0.0 0.0 - 0.1 K/uL   Immature Granulocytes 0 %   Abs Immature Granulocytes 0.02 0.00 - 0.07 K/uL    Comment: Performed at [facility], [address] [place], [place] [address]  Comprehensive metabolic panel     Status: Abnormal   Collection Time: 11/04/20  2:39 PM  Result Value Ref Range   Sodium 142 135 - 145 mmol/L   Potassium 2.1 (LL) 3.5 - 5.1 mmol/L    Comment: CRITICAL RESULT CALLED TO, READ BACK BY AND VERIFIED WITH ELIZABETH GANNON  11/04/20 MJU    Chloride 101 98 - 111 mmol/L   CO2 29 22 - 32 mmol/L   Glucose, Bld 128 (H) 70 - 99 mg/dL    Comment: Glucose reference range applies only to samples taken after fasting for at least 8 hours.   BUN 21 8 - 23  mg/dL   Creatinine, Ser 4.69 0.44 - 1.00 mg/dL   Calcium 8.6 (L) 8.9 - 10.3 mg/dL   Total Protein 7.3 6.5 - 8.1 g/dL   Albumin 3.0 (L) 3.5 - 5.0 g/dL   AST 17 15 - 41 U/L   ALT 14 0 - 44 U/L   Alkaline Phosphatase 95 38 - 126 U/L   Total Bilirubin 0.6 0.3 - 1.2 mg/dL   GFR, Estimated >62 >95 mL/min    Comment: (NOTE) Calculated using the CKD-EPI Creatinine Equation (2021)    Anion gap 12 5 - 15    Comment: Performed at [facility], [address] [place], [place] [address]  Magnesium     Status: None   Collection Time: 11/04/20  3:18 PM  Result Value Ref Range   Magnesium 1.9 1.7 - 2.4 mg/dL    Comment: Performed at [facility], [address] [place], [place] [address]  SARS CORONAVIRUS 2 (TAT 6-24 HRS) Nasopharyngeal Nasopharyngeal Swab     Status: None   Collection Time: 11/04/20  4:16 PM   Specimen: Nasopharyngeal Swab  Result Value Ref Range   SARS Coronavirus 2 NEGATIVE NEGATIVE    Comment: (NOTE) SARS-CoV-2 target nucleic acids are NOT DETECTED.  The SARS-CoV-2 RNA is generally detectable in upper and lower respiratory specimens during the acute phase of infection. Negative results do not preclude SARS-CoV-2 infection, do not rule out co-infections with other pathogens, and should not be used as the sole basis for treatment or other patient management decisions. Negative results must be combined with clinical observations, patient history, and epidemiological information. The expected result is Negative.  Fact Sheet for Patients: HairSlick.no  Fact Sheet for Healthcare Providers: quierodirigir.com  This test is not yet approved or cleared by the [place] FDA and  has been authorized for detection and/or diagnosis of SARS-CoV-2 by FDA under an Emergency Use Authorization (EUA). This EUA will remain  in effect (meaning this test can be used) for the duration of the COVID-19  declaration under Se ction 564(b)(1) of the Act, 21 U.S.C. section 360bbb-3(b)(1), unless the authorization is terminated or revoked sooner.  Performed at [facility], [address], [place], [place] [address]   Basic metabolic panel     Status: Abnormal   Collection Time: 11/04/20  6:52 PM  Result Value Ref Range   Sodium 140 135 - 145 mmol/L   Potassium 2.8 (L) 3.5 - 5.1 mmol/L   Chloride 101 98 - 111 mmol/L   CO2 29 22 - 32 mmol/L   Glucose, Bld 89 70 - 99 mg/dL    Comment: Glucose reference range applies only to samples taken after fasting for at least 8 hours.   BUN 20 8 - 23 mg/dL   Creatinine, Ser 9.14 0.44 - 1.00 mg/dL   Calcium 8.5 (L) 8.9 - 10.3 mg/dL   GFR, Estimated >78 >29 mL/min    Comment: (NOTE) Calculated using the CKD-EPI Creatinine Equation (2021)    Anion gap 10 5 - 15    Comment: Performed at [facility] [address], [place], [place] [address]    Assessment/Plan:  Atherosclerosis of native arteries of the extremities with gangrene [facility]) The patient has severe peripheral arterial disease and is status post an attempted a femoral to peroneal bypass another institution which was not technically feasible.  She has gangrenous tissue at the base of her left great toe from the previous amputation site.  She does not have enough flow to heal that wound or the lower incision from the bypass attempt.  She has been recommended to have an amputation which is certainly a reasonable option.  It would appear that the care she has received has been appropriate but she does have very severe disease.  I discussed with her that as best I can tell I do not think there is any true attempted endovascular revascularization I am certainly willing to try that before performing an amputation.  That might also determine whether or not a below-knee amputation may be an option or if we will have to do an above-knee amputation.  I discussed the critical  and limb  threatening nature of the situation.  I have been very frank and honest with the patient and her daughter and told them that I think that amputation is the most likely outcome and that is true even with successful revascularization.  She has significant tissue loss.  They are interested in trying anything that could potentially offer a limb salvage option, so we will plan on an angiogram this week for further evaluation and potential treatment.  Essential hypertension blood pressure control important in reducing the progression of atherosclerotic disease. On appropriate oral medications.   Leg swelling Swelling is fairly significant bilaterally, but until her perfusion is restored where she proceeds to amputation, I would not wrap her in Unna boots or do any sort of compression.  Tobacco abuse Represents a significant atherosclerotic risk factor with her severe disease.      [name] 11/05/2020, 11:36 AM   This note was created with Dragon medical transcription system.  Any errors from dictation are unintentional.

## 2020-11-05 NOTE — Assessment & Plan Note (Signed)
Swelling is fairly significant bilaterally, but until her perfusion is restored where she proceeds to amputation, I would not wrap her in Unna boots or do any sort of compression.

## 2020-11-05 NOTE — H&P (View-Only) (Signed)
  Patient ID: Sarah Bonilla, female   DOB: 07/22/1955, 65 y.o.   MRN: [id number]  Chief Complaint  Patient presents with  . New Patient (Initial Visit)    Second opion for Lt above knee amputation    HPI Sarah Bonilla is a 65 y.o. female.  I am asked to see the patient by Dr. 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  She was seen by another vascular group in [place] and had an angiogram performed with a subsequent attempt at a femme peroneal bypass with a left great toe amputation back in March.  Since that time, amputation has been recommended and she has been in significant pain.  Reviewing the interventions and procedures, her peroneal artery was the only vessel that reconstituted well but it was not suitable for distal bypass.  Patient does not have fever or chills or signs of systemic infection.  She does have swelling in both legs.  She is in a lot of pain and continues to be.     Past Medical History:  Diagnosis Date  . GERD (gastroesophageal reflux disease)   . Hypertension   . Peripheral arterial disease (HCC)     Past Surgical History:  Procedure Laterality Date  . ABDOMINAL AORTOGRAM W/LOWER EXTREMITY Bilateral 08/16/2020   Procedure: ABDOMINAL AORTOGRAM W/LOWER EXTREMITY;  Surgeon: Fields, Charles E, MD;  Location: [facility];  Service: Cardiovascular;  Laterality: Bilateral;  . AMPUTATION Left 08/20/2020   Procedure: LEFT FIRST TOE AMPUTATION;  Surgeon: Fields, Charles E, MD;  Location: [facility] OR;  Service: Vascular;  Laterality: Left;  . BYPASS GRAFT FEMORAL-PERONEAL Left 08/20/2020   Procedure: exploration left peroneal artery ;  Surgeon: Fields, Charles E, MD;  Location: [facility] OR;  Service: Vascular;  Laterality: Left;  . CHOLECYSTECTOMY    . VEIN HARVEST Left 08/20/2020    Procedure: VEIN HARVEST LEFT GREATER SAPHENOUS VEIN;  Surgeon: Fields, Charles E, MD;  Location: [facility] OR;  Service: Vascular;  Laterality: Left;     Family History  Problem Relation Age of Onset  . CVA Mother   . Hypertension Father   no bleeding or clotting disorders   Social History   Tobacco Use  . Smoking status: Current Every Day Smoker    Packs/day: 1.00    Types: Cigarettes  . Smokeless tobacco: Never Used  Vaping Use  . Vaping Use: Never used  Substance Use Topics  . Alcohol use: Yes    Comment: occasionally   . Drug use: Never    No Known Allergies  Current Outpatient Medications  Medication Sig Dispense Refill  . acetaminophen (TYLENOL) 325 MG tablet Take 650 mg by mouth every 6 (six) hours as needed for moderate pain or headache.    . albuterol (VENTOLIN HFA) 108 (90 Base) MCG/ACT inhaler Inhale 1 puff into the lungs 4 (four) times daily as needed for wheezing or shortness of breath.    . apixaban (ELIQUIS) 5 MG TABS tablet TAKE 1 TABLET (5 MG TOTAL) BY MOUTH TWO TIMES DAILY. 60 tablet 2  . carvedilol (COREG) 25 MG tablet Take 1 tablet (25 mg total) by mouth 2 (two) times daily with a meal. 60 tablet 2  . diltiazem (CARDIZEM CD) 360 MG 24 hr capsule Take 1 capsule (360 mg total) by mouth   daily. 30 capsule 1  . enoxaparin (LOVENOX) 40 MG/0.4ML injection Inject 0.4 mLs (40 mg total) into the skin every 12 (twelve) hours for 3 days. 2.4 mL 0  . furosemide (LASIX) 40 MG tablet Take 40 mg by mouth daily.    . hydrALAZINE (APRESOLINE) 100 MG tablet Take 1 tablet (100 mg total) by mouth every 8 (eight) hours. 90 tablet 1  . HYDROcodone-acetaminophen (NORCO/VICODIN) 5-325 MG tablet Take 1 tablet by mouth every 6 (six) hours as needed for severe pain. 20 tablet 0  . ibuprofen (ADVIL) 200 MG tablet Take 400 mg by mouth every 6 (six) hours as needed for mild pain.    . isosorbide mononitrate (IMDUR) 60 MG 24 hr tablet Take 1 tablet (60 mg total) by mouth daily. 30 tablet 1  .  lisinopril (ZESTRIL) 40 MG tablet Take 1 tablet (40 mg total) by mouth daily. 30 tablet 2  . losartan (COZAAR) 50 MG tablet Take 50 mg by mouth daily.    . pantoprazole (PROTONIX) 40 MG tablet Take 1 tablet (40 mg total) by mouth daily. (Patient not taking: Reported on 11/04/2020) 30 tablet 1  . potassium chloride (KLOR-CON) 10 MEQ tablet Take 10 mEq by mouth daily.    . potassium chloride SA (KLOR-CON) 20 MEQ tablet Take 1 tablet (20 mEq total) by mouth 2 (two) times daily for 10 days. 20 tablet 0   No current facility-administered medications for this visit.      REVIEW OF SYSTEMS (Negative unless checked)  Constitutional: []Weight loss  []Fever  []Chills Cardiac: []Chest pain   []Chest pressure   []Palpitations   []Shortness of breath when laying flat   []Shortness of breath at rest   []Shortness of breath with exertion. Vascular:  []Pain in legs with walking   []Pain in legs at rest   []Pain in legs when laying flat   []Claudication   []Pain in feet when walking  []Pain in feet at rest  []Pain in feet when laying flat   []History of DVT   []Phlebitis   []Swelling in legs   []Varicose veins   [x]Non-healing ulcers Pulmonary:   []Uses home oxygen   []Productive cough   []Hemoptysis   []Wheeze  []COPD   []Asthma Neurologic:  []Dizziness  []Blackouts   []Seizures   []History of stroke   []History of TIA  []Aphasia   []Temporary blindness   []Dysphagia   []Weakness or numbness in arms   []Weakness or numbness in legs Musculoskeletal:  [x]Arthritis   []Joint swelling   []Joint pain   []Low back pain Hematologic:  []Easy bruising  []Easy bleeding   []Hypercoagulable state   []Anemic  []Hepatitis Gastrointestinal:  []Blood in stool   []Vomiting blood  [x]Gastroesophageal reflux/heartburn   []Abdominal pain Genitourinary:  []Chronic kidney disease   []Difficult urination  []Frequent urination  []Burning with urination   []Hematuria Skin:  []Rashes   [x]Ulcers   [x]Wounds Psychological:  []History of  anxiety   [] History of major depression.    Physical Exam BP (!) 168/78   Pulse 70   Ht 5' 2" (1.575 m)   Wt 98 lb (44.5 kg)   BMI 17.92 kg/m  Gen: Thin, NAD Head: /AT, No temporalis wasting. Ear/Nose/Throat: Hearing grossly intact, nares w/o erythema or drainage, oropharynx w/o Erythema/Exudate Eyes: Conjunctiva clear, sclera non-icteric  Neck: trachea midline.  No JVD.  Pulmonary:  Good air movement, respirations not labored, no use of accessory muscles  Cardiac: RRR, no JVD Vascular:    Vessel Right Left  Radial Palpable Palpable                          DP NP NP  PT NP NP   Gastrointestinal:. No masses, surgical incisions, or scars. Musculoskeletal: M/S 5/5 throughout.  Left foot is ischemic.  Left great toe amputation site with necrotic tissue present.  Nonhealing wound at the lower left lower leg incision medially from her previous attempted a fem to peroneal bypass.  This wound is fairly clean.  1-2+ right lower extremity edema, 2-3+ left lower extremity edema Neurologic: Sensation grossly intact in extremities.  Symmetrical.  Speech is fluent. Motor exam as listed above. Psychiatric: Judgment intact, Mood & affect appropriate for pt's clinical situation. Dermatologic: Left leg wounds as above    Radiology No results found.  Labs Recent Results (from the past 2160 hour(s))  Basic metabolic panel     Status: Abnormal   Collection Time: 08/10/20 10:07 PM  Result Value Ref Range   Sodium 142 135 - 145 mmol/L   Potassium 2.4 (LL) 3.5 - 5.1 mmol/L    Comment: CRITICAL RESULT CALLED TO, READ BACK BY AND VERIFIED WITH: J SAPPELT AT 2309 ON 08/10/2020 BY JPM     Chloride 106 98 - 111 mmol/L   CO2 25 22 - 32 mmol/L   Glucose, Bld 105 (H) 70 - 99 mg/dL    Comment: Glucose reference range applies only to samples taken after fasting for at least 8 hours.   BUN 23 8 - 23 mg/dL   Creatinine, Ser 0.70 0.44 - 1.00 mg/dL   Calcium 9.2 8.9 - 10.3 mg/dL   GFR, Estimated  >60 >60 mL/min    Comment: (NOTE) Calculated using the CKD-EPI Creatinine Equation (2021)    Anion gap 11 5 - 15    Comment: Performed at [facility], [address] [place], [place] [address]  CBC     Status: Abnormal   Collection Time: 08/10/20 10:07 PM  Result Value Ref Range   WBC 7.3 4.0 - 10.5 K/uL   RBC 3.32 (L) 3.87 - 5.11 MIL/uL   Hemoglobin 10.0 (L) 12.0 - 15.0 g/dL   HCT 30.5 (L) 36.0 - 46.0 %   MCV 91.9 80.0 - 100.0 fL   MCH 30.1 26.0 - 34.0 pg   MCHC 32.8 30.0 - 36.0 g/dL   RDW 15.5 11.5 - 15.5 %   Platelets 183 150 - 400 K/uL   nRBC 0.0 0.0 - 0.2 %    Comment: Performed at [facility], [address] [place], [place] [address]  Sedimentation rate     Status: Abnormal   Collection Time: 08/10/20 10:08 PM  Result Value Ref Range   Sed Rate 25 (H) 0 - 22 mm/hr    Comment: Performed at [facility], [address] [place], [place] [address]  C-reactive protein     Status: Abnormal   Collection Time: 08/10/20 10:08 PM  Result Value Ref Range   CRP 1.0 (H) <1.0 mg/dL    Comment: Performed at [facility], [address] [place], [place] [address]  Lactic acid, plasma     Status: None   Collection Time: 08/10/20 10:08 PM  Result Value Ref Range   Lactic Acid, Venous 1.1 0.5 - 1.9 mmol/L    Comment: Performed at [facility], [address] [place], [place] [address]  Hemoglobin A1c     Status: None   Collection Time: 08/10/20 10:08   PM  Result Value Ref Range   Hgb A1c MFr Bld 5.1 4.8 - 5.6 %    Comment: (NOTE) Pre diabetes:          5.7%-6.4%  Diabetes:              >6.4%  Glycemic control for   <7.0% adults with diabetes    Mean Plasma Glucose 99.67 mg/dL    Comment: Performed at [facility], [address] [place], [place] [address]  Lactic acid, plasma     Status: None   Collection Time: 08/10/20 11:52 PM  Result Value Ref Range   Lactic Acid, Venous 1.5 0.5 - 1.9 mmol/L    Comment: Performed at [facility], [address] [place], [place]  [address]  D-dimer, quantitative     Status: Abnormal   Collection Time: 08/10/20 11:52 PM  Result Value Ref Range   D-Dimer, Quant 0.70 (H) 0.00 - 0.50 ug/mL-FEU    Comment: (NOTE) At the manufacturer cut-off value of 0.5 g/mL FEU, this assay has a negative predictive value of 95-100%.This assay is intended for use in conjunction with a clinical pretest probability (PTP) assessment model to exclude pulmonary embolism (PE) and deep venous thrombosis (DVT) in outpatients suspected of PE or DVT. Results should be correlated with clinical presentation. Performed at [facility], [address] [place], [place] [address]   Brain natriuretic peptide     Status: Abnormal   Collection Time: 08/10/20 11:52 PM  Result Value Ref Range   B Natriuretic Peptide 2,259.0 (H) 0.0 - 100.0 pg/mL    Comment: Performed at [facility], [address] [place], [place] [address]  Magnesium     Status: None   Collection Time: 08/10/20 11:52 PM  Result Value Ref Range   Magnesium 1.7 1.7 - 2.4 mg/dL    Comment: Performed at [facility], [address] [place], [place] [address]  Blood culture (routine x 2)     Status: None   Collection Time: 08/10/20 11:53 PM   Specimen: BLOOD RIGHT FOREARM  Result Value Ref Range   Specimen Description BLOOD RIGHT FOREARM    Special Requests      BOTTLES DRAWN AEROBIC AND ANAEROBIC Blood Culture adequate volume   Culture      NO GROWTH 5 DAYS Performed at [facility], [address] [place], [place] [address]    Report Status 08/16/2020 FINAL   Blood culture (routine x 2)     Status: None   Collection Time: 08/11/20 12:00 AM   Specimen: BLOOD RIGHT ARM  Result Value Ref Range   Specimen Description BLOOD RIGHT ARM    Special Requests      BOTTLES DRAWN AEROBIC AND ANAEROBIC Blood Culture results may not be optimal due to an excessive volume of blood received in culture bottles   Culture      NO GROWTH 5 DAYS Performed at [facility], [address]  [place],  [address]    Report Status 08/16/2020 FINAL   Resp Panel by RT-PCR (Flu A&B, Covid) Nasopharyngeal Swab     Status: None   Collection Time: 08/11/20  1:07 AM   Specimen: Nasopharyngeal Swab; Nasopharyngeal(NP) swabs in vial transport medium  Result Value Ref Range   SARS Coronavirus 2 by RT PCR NEGATIVE NEGATIVE    Comment: (NOTE) SARS-CoV-2 target nucleic acids are NOT DETECTED.  The SARS-CoV-2 RNA is generally detectable in upper respiratory specimens during the acute phase of infection. The lowest concentration   of SARS-CoV-2 viral copies this assay can detect is 138 copies/mL. A negative result does not preclude SARS-Cov-2 infection and should not be used as the sole basis for treatment or other patient management decisions. A negative result may occur with  improper specimen collection/handling, submission of specimen other than nasopharyngeal swab, presence of viral mutation(s) within the areas targeted by this assay, and inadequate number of viral copies(<138 copies/mL). A negative result must be combined with clinical observations, patient history, and epidemiological information. The expected result is Negative.  Fact Sheet for Patients:  https://www.fda.gov/media/152166/download  Fact Sheet for Healthcare Providers:  https://www.fda.gov/media/152162/download  This test is no t yet approved or cleared by the [place] FDA and  has been authorized for detection and/or diagnosis of SARS-CoV-2 by FDA under an Emergency Use Authorization (EUA). This EUA will remain  in effect (meaning this test can be used) for the duration of the COVID-19 declaration under Section 564(b)(1) of the Act, 21 U.S.C.section 360bbb-3(b)(1), unless the authorization is terminated  or revoked sooner.       Influenza A by PCR NEGATIVE NEGATIVE   Influenza B by PCR NEGATIVE NEGATIVE    Comment: (NOTE) The Xpert Xpress SARS-CoV-2/FLU/RSV plus assay is intended as an aid in the  diagnosis of influenza from Nasopharyngeal swab specimens and should not be used as a sole basis for treatment. Nasal washings and aspirates are unacceptable for Xpert Xpress SARS-CoV-2/FLU/RSV testing.  Fact Sheet for Patients: https://www.fda.gov/media/152166/download  Fact Sheet for Healthcare Providers: https://www.fda.gov/media/152162/download  This test is not yet approved or cleared by the [place] FDA and has been authorized for detection and/or diagnosis of SARS-CoV-2 by FDA under an Emergency Use Authorization (EUA). This EUA will remain in effect (meaning this test can be used) for the duration of the COVID-19 declaration under Section 564(b)(1) of the Act, 21 U.S.C. section 360bbb-3(b)(1), unless the authorization is terminated or revoked.  Performed at [facility], [address] [place], [place] [address]   Basic metabolic panel     Status: Abnormal   Collection Time: 08/11/20  5:52 AM  Result Value Ref Range   Sodium 142 135 - 145 mmol/L   Potassium 2.5 (LL) 3.5 - 5.1 mmol/L    Comment: CRITICAL RESULT CALLED TO, READ BACK BY AND VERIFIED WITH: J SAPPELT @6051 by matthews, b 3.13.22    Chloride 101 98 - 111 mmol/L   CO2 29 22 - 32 mmol/L   Glucose, Bld 102 (H) 70 - 99 mg/dL    Comment: Glucose reference range applies only to samples taken after fasting for at least 8 hours.   BUN 18 8 - 23 mg/dL   Creatinine, Ser 0.51 0.44 - 1.00 mg/dL   Calcium 9.0 8.9 - 10.3 mg/dL   GFR, Estimated >60 >60 mL/min    Comment: (NOTE) Calculated using the CKD-EPI Creatinine Equation (2021)    Anion gap 12 5 - 15    Comment: Performed at [facility], [address] [place], [place] [address]  MRSA PCR Screening     Status: None   Collection Time: 08/11/20  9:12 AM   Specimen: Nasal Mucosa; Nasopharyngeal  Result Value Ref Range   MRSA by PCR NEGATIVE NEGATIVE    Comment:        The GeneXpert MRSA Assay (FDA approved for NASAL specimens only), is one component of  a comprehensive MRSA colonization surveillance program. It is not intended to diagnose MRSA infection nor to guide or monitor treatment for MRSA infections. Performed at   [facility], [address] [place], [place] [address]   Urine rapid drug screen (hosp performed)     Status: None   Collection Time: 08/11/20 11:48 AM  Result Value Ref Range   Opiates NONE DETECTED NONE DETECTED   Cocaine NONE DETECTED NONE DETECTED   Benzodiazepines NONE DETECTED NONE DETECTED   Amphetamines NONE DETECTED NONE DETECTED   Tetrahydrocannabinol NONE DETECTED NONE DETECTED   Barbiturates NONE DETECTED NONE DETECTED    Comment: (NOTE) DRUG SCREEN FOR MEDICAL PURPOSES ONLY.  IF CONFIRMATION IS NEEDED FOR ANY PURPOSE, NOTIFY LAB WITHIN 5 DAYS.  LOWEST DETECTABLE LIMITS FOR URINE DRUG SCREEN Drug Class                     Cutoff (ng/mL) Amphetamine and metabolites    1000 Barbiturate and metabolites    200 Benzodiazepine                 200 Tricyclics and metabolites     300 Opiates and metabolites        300 Cocaine and metabolites        300 THC                            50 Performed at [facility], [address] [place], [place] [address]   Heparin level (unfractionated)     Status: Abnormal   Collection Time: 08/11/20  2:46 PM  Result Value Ref Range   Heparin Unfractionated <0.10 (L) 0.30 - 0.70 IU/mL    Comment: (NOTE) If heparin results are below expected values, and patient dosage has  been confirmed, suggest follow up testing of antithrombin III levels. Performed at [facility], [address] [place], [place] [address]   Heparin level (unfractionated)     Status: Abnormal   Collection Time: 08/11/20 10:20 PM  Result Value Ref Range   Heparin Unfractionated 0.21 (L) 0.30 - 0.70 IU/mL    Comment: (NOTE) If heparin results are below expected values, and patient dosage has  been confirmed, suggest follow up testing of antithrombin III levels. Performed at [facility],  [address] [place], [place] [address]   Heparin level (unfractionated)     Status: None   Collection Time: 08/12/20  8:02 AM  Result Value Ref Range   Heparin Unfractionated 0.38 0.30 - 0.70 IU/mL    Comment: (NOTE) If heparin results are below expected values, and patient dosage has  been confirmed, suggest follow up testing of antithrombin III levels. Performed at  Hospital, [address] [place], [place] [address]   Basic metabolic panel     Status: Abnormal   Collection Time: 08/12/20  8:02 AM  Result Value Ref Range   Sodium 144 135 - 145 mmol/L   Potassium 3.2 (L) 3.5 - 5.1 mmol/L    Comment: DELTA CHECK NOTED   Chloride 109 98 - 111 mmol/L   CO2 23 22 - 32 mmol/L   Glucose, Bld 110 (H) 70 - 99 mg/dL    Comment: Glucose reference range applies only to samples taken after fasting for at least 8 hours.   BUN 18 8 - 23 mg/dL   Creatinine, Ser 0.47 0.44 - 1.00 mg/dL   Calcium 9.2 8.9 - 10.3 mg/dL   GFR, Estimated >60 >60 mL/min    Comment: (NOTE) Calculated using the CKD-EPI Creatinine Equation (2021)    Anion gap 12 5 - 15    Comment:   Performed at [facility], [address] [place], [place] [address]  CBC     Status: Abnormal   Collection Time: 08/12/20  8:02 AM  Result Value Ref Range   WBC 12.0 (H) 4.0 - 10.5 K/uL   RBC 3.15 (L) 3.87 - 5.11 MIL/uL   Hemoglobin 9.3 (L) 12.0 - 15.0 g/dL   HCT 29.5 (L) 36.0 - 46.0 %   MCV 93.7 80.0 - 100.0 fL   MCH 29.5 26.0 - 34.0 pg   MCHC 31.5 30.0 - 36.0 g/dL   RDW 15.9 (H) 11.5 - 15.5 %   Platelets 176 150 - 400 K/uL   nRBC 0.0 0.0 - 0.2 %    Comment: Performed at [facility], [address] [place], [place] [address]  Magnesium     Status: None   Collection Time: 08/12/20  8:02 AM  Result Value Ref Range   Magnesium 1.7 1.7 - 2.4 mg/dL    Comment: Performed at [facility], [address] [place], [place] [address]  ECHOCARDIOGRAM COMPLETE     Status: None   Collection Time: 08/12/20  2:24 PM  Result Value Ref Range    Weight 1,576 oz   Height 62 in   BP 171/85 mmHg   Area-P 1/2 3.46 cm2   S' Lateral 2.60 cm   MV M vel 5.84 m/s   MV Peak grad 136.4 mmHg   Radius 0.60 cm  Heparin level (unfractionated)     Status: None   Collection Time: 08/12/20  3:27 PM  Result Value Ref Range   Heparin Unfractionated 0.32 0.30 - 0.70 IU/mL    Comment: (NOTE) If heparin results are below expected values, and patient dosage has  been confirmed, suggest follow up testing of antithrombin III levels. Performed at [facility], [address] [place], [place] [address]   Heparin level (unfractionated)     Status: Abnormal   Collection Time: 08/13/20  2:19 AM  Result Value Ref Range   Heparin Unfractionated 0.23 (L) 0.30 - 0.70 IU/mL    Comment: (NOTE) If heparin results are below expected values, and patient dosage has  been confirmed, suggest follow up testing of antithrombin III levels. Performed at [facility], [address] [place], [place] [address]   CBC     Status: Abnormal   Collection Time: 08/13/20  2:19 AM  Result Value Ref Range   WBC 10.5 4.0 - 10.5 K/uL   RBC 3.14 (L) 3.87 - 5.11 MIL/uL   Hemoglobin 9.3 (L) 12.0 - 15.0 g/dL   HCT 29.0 (L) 36.0 - 46.0 %   MCV 92.4 80.0 - 100.0 fL   MCH 29.6 26.0 - 34.0 pg   MCHC 32.1 30.0 - 36.0 g/dL   RDW 15.9 (H) 11.5 - 15.5 %   Platelets 170 150 - 400 K/uL   nRBC 0.0 0.0 - 0.2 %    Comment: Performed at [facility], [address] [place],  [address]  Basic metabolic panel     Status: Abnormal   Collection Time: 08/13/20  2:19 AM  Result Value Ref Range   Sodium 143 135 - 145 mmol/L   Potassium 3.4 (L) 3.5 - 5.1 mmol/L   Chloride 108 98 - 111 mmol/L   CO2 25 22 - 32 mmol/L   Glucose, Bld 115 (H) 70 - 99 mg/dL    Comment: Glucose reference range applies only to samples taken after fasting for at least 8 hours.   BUN   19 8 - 23 mg/dL   Creatinine, Ser 0.61 0.44 - 1.00 mg/dL   Calcium 9.4 8.9 - 10.3 mg/dL   GFR, Estimated >60 >60  mL/min    Comment: (NOTE) Calculated using the CKD-EPI Creatinine Equation (2021)    Anion gap 10 5 - 15    Comment: Performed at [facility], [address] [place], [place] [address]  Magnesium     Status: None   Collection Time: 08/13/20  2:19 AM  Result Value Ref Range   Magnesium 1.7 1.7 - 2.4 mg/dL    Comment: Performed at [facility], [address] [place], [place] [address]  Heparin level (unfractionated)     Status: None   Collection Time: 08/13/20 11:00 AM  Result Value Ref Range   Heparin Unfractionated 0.32 0.30 - 0.70 IU/mL    Comment: (NOTE) If heparin results are below expected values, and patient dosage has  been confirmed, suggest follow up testing of antithrombin III levels. Performed at [facility], [address] [place], [place] [address]   Heparin level (unfractionated)     Status: Abnormal   Collection Time: 08/14/20  3:43 AM  Result Value Ref Range   Heparin Unfractionated 0.29 (L) 0.30 - 0.70 IU/mL    Comment: (NOTE) If heparin results are below expected values, and patient dosage has  been confirmed, suggest follow up testing of antithrombin III levels. Performed at [facility], [address] [place], [place] [address]   CBC     Status: Abnormal   Collection Time: 08/14/20  3:43 AM  Result Value Ref Range   WBC 11.3 (H) 4.0 - 10.5 K/uL   RBC 2.96 (L) 3.87 - 5.11 MIL/uL   Hemoglobin 8.7 (L) 12.0 - 15.0 g/dL   HCT 27.2 (L) 36.0 - 46.0 %   MCV 91.9 80.0 - 100.0 fL   MCH 29.4 26.0 - 34.0 pg   MCHC 32.0 30.0 - 36.0 g/dL   RDW 16.3 (H) 11.5 - 15.5 %   Platelets 169 150 - 400 K/uL   nRBC 0.0 0.0 - 0.2 %    Comment: Performed at [facility], [address] [place], [place] [address]  Basic metabolic panel     Status: Abnormal   Collection Time: 08/14/20  3:43 AM  Result Value Ref Range   Sodium 141 135 - 145 mmol/L   Potassium 3.9 3.5 - 5.1 mmol/L   Chloride 109 98 - 111 mmol/L   CO2 23 22 - 32 mmol/L    Glucose, Bld 103 (H) 70 - 99 mg/dL    Comment: Glucose reference range applies only to samples taken after fasting for at least 8 hours.   BUN 17 8 - 23 mg/dL   Creatinine, Ser 0.76 0.44 - 1.00 mg/dL   Calcium 9.2 8.9 - 10.3 mg/dL   GFR, Estimated >60 >60 mL/min    Comment: (NOTE) Calculated using the CKD-EPI Creatinine Equation (2021)    Anion gap 9 5 - 15    Comment: Performed at [facility], [address] [place], [place] [address]  Magnesium     Status: None   Collection Time: 08/14/20  3:43 AM  Result Value Ref Range   Magnesium 2.1 1.7 - 2.4 mg/dL    Comment: Performed at [facility], [address] , [place] [address]  C-reactive protein     Status: Abnormal   Collection Time: 08/14/20  3:43   AM  Result Value Ref Range   CRP 1.9 (H) <1.0 mg/dL    Comment: Performed at [facility], [address] [place], [place] [address]  Heparin level (unfractionated)     Status: None   Collection Time: 08/15/20  2:19 AM  Result Value Ref Range   Heparin Unfractionated 0.35 0.30 - 0.70 IU/mL    Comment: (NOTE) If heparin results are below expected values, and patient dosage has  been confirmed, suggest follow up testing of antithrombin III levels. Performed at [facility], [address] [place], [place] [address]   CBC     Status: Abnormal   Collection Time: 08/15/20  2:19 AM  Result Value Ref Range   WBC 11.3 (H) 4.0 - 10.5 K/uL   RBC 2.72 (L) 3.87 - 5.11 MIL/uL   Hemoglobin 8.0 (L) 12.0 - 15.0 g/dL   HCT 25.5 (L) 36.0 - 46.0 %   MCV 93.8 80.0 - 100.0 fL   MCH 29.4 26.0 - 34.0 pg   MCHC 31.4 30.0 - 36.0 g/dL   RDW 16.1 (H) 11.5 - 15.5 %   Platelets 167 150 - 400 K/uL   nRBC 0.0 0.0 - 0.2 %    Comment: Performed at [facility], [address] [place], [place] [address]  Occult blood card to lab, stool     Status: None   Collection Time: 08/15/20  4:01 PM  Result Value Ref Range   Fecal Occult Bld NEGATIVE NEGATIVE    Comment:  Performed at [facility], [address] [place], [place] [address]  Heparin level (unfractionated)     Status: Abnormal   Collection Time: 08/16/20  2:51 AM  Result Value Ref Range   Heparin Unfractionated 0.28 (L) 0.30 - 0.70 IU/mL    Comment: (NOTE) If heparin results are below expected values, and patient dosage has  been confirmed, suggest follow up testing of antithrombin III levels. Performed at [facility], [address] , [place] [address]   CBC     Status: Abnormal   Collection Time: 08/16/20  2:51 AM  Result Value Ref Range   WBC 9.7 4.0 - 10.5 K/uL   RBC 2.83 (L) 3.87 - 5.11 MIL/uL   Hemoglobin 8.4 (L) 12.0 - 15.0 g/dL   HCT 26.1 (L) 36.0 - 46.0 %   MCV 92.2 80.0 - 100.0 fL   MCH 29.7 26.0 - 34.0 pg   MCHC 32.2 30.0 - 36.0 g/dL   RDW 15.9 (H) 11.5 - 15.5 %   Platelets 172 150 - 400 K/uL   nRBC 0.0 0.0 - 0.2 %    Comment: Performed at [facility], [address] , [place] [address]  Basic metabolic panel     Status: Abnormal   Collection Time: 08/16/20  2:51 AM  Result Value Ref Range   Sodium 140 135 - 145 mmol/L   Potassium 3.6 3.5 - 5.1 mmol/L   Chloride 109 98 - 111 mmol/L   CO2 22 22 - 32 mmol/L   Glucose, Bld 101 (H) 70 - 99 mg/dL    Comment: Glucose reference range applies only to samples taken after fasting for at least 8 hours.   BUN 14 8 - 23 mg/dL   Creatinine, Ser 0.80 0.44 - 1.00 mg/dL   Calcium 9.1 8.9 - 10.3 mg/dL   GFR, Estimated >60 >60 mL/min    Comment: (NOTE) Calculated using the CKD-EPI Creatinine Equation (  2021)    Anion gap 9 5 - 15    Comment: Performed at [facility], [address] [place], [place] [address]  Magnesium     Status: None   Collection Time: 08/16/20  2:51 AM  Result Value Ref Range   Magnesium 1.9 1.7 - 2.4 mg/dL    Comment: Performed at [facility], [address] [place], [place] [address]  Surgical PCR screen     Status: None   Collection Time: 08/16/20  7:43  AM   Specimen: Nasal Mucosa; Nasal Swab  Result Value Ref Range   MRSA, PCR NEGATIVE NEGATIVE   Staphylococcus aureus NEGATIVE NEGATIVE    Comment: (NOTE) The Xpert SA Assay (FDA approved for NASAL specimens in patients 22 years of age and older), is one component of a comprehensive surveillance program. It is not intended to diagnose infection nor to guide or monitor treatment. Performed at [facility], [address] [place], [place] [address]   Heparin level (unfractionated)     Status: Abnormal   Collection Time: 08/17/20 12:41 AM  Result Value Ref Range   Heparin Unfractionated 0.23 (L) 0.30 - 0.70 IU/mL    Comment: (NOTE) If heparin results are below expected values, and patient dosage has  been confirmed, suggest follow up testing of antithrombin III levels. Performed at [facility], [address] [place], [place] [address]   Vancomycin, peak     Status: Abnormal   Collection Time: 08/17/20 12:41 AM  Result Value Ref Range   Vancomycin Pk 24 (L) 30 - 40 ug/mL    Comment: Performed at [facility], [address] [place], [place] [address]  Basic metabolic panel     Status: Abnormal   Collection Time: 08/17/20 12:41 AM  Result Value Ref Range   Sodium 139 135 - 145 mmol/L   Potassium 3.0 (L) 3.5 - 5.1 mmol/L   Chloride 112 (H) 98 - 111 mmol/L   CO2 20 (L) 22 - 32 mmol/L   Glucose, Bld 96 70 - 99 mg/dL    Comment: Glucose reference range applies only to samples taken after fasting for at least 8 hours.   BUN 11 8 - 23 mg/dL   Creatinine, Ser 0.71 0.44 - 1.00 mg/dL   Calcium 8.8 (L) 8.9 - 10.3 mg/dL   GFR, Estimated >60 >60 mL/min    Comment: (NOTE) Calculated using the CKD-EPI Creatinine Equation (2021)    Anion gap 7 5 - 15    Comment: Performed at [facility], [address] [place], [place] [address]  CBC     Status: Abnormal   Collection Time: 08/17/20 12:41 AM  Result Value Ref Range   WBC 8.6 4.0 - 10.5 K/uL   RBC 2.78 (L)  3.87 - 5.11 MIL/uL   Hemoglobin 8.2 (L) 12.0 - 15.0 g/dL   HCT 25.2 (L) 36.0 - 46.0 %   MCV 90.6 80.0 - 100.0 fL   MCH 29.5 26.0 - 34.0 pg   MCHC 32.5 30.0 - 36.0 g/dL   RDW 15.6 (H) 11.5 - 15.5 %   Platelets 193 150 - 400 K/uL   nRBC 0.0 0.0 - 0.2 %    Comment: Performed at [facility], [address] [place], [place] [address]  Heparin level (unfractionated)     Status: None   Collection Time: 08/17/20  7:47 AM  Result Value Ref Range   Heparin Unfractionated 0.37   0.30 - 0.70 IU/mL    Comment: (NOTE) If heparin results are below expected values, and patient dosage has  been confirmed, suggest follow up testing of antithrombin III levels. Performed at [facility], [address] [place], [place] [address]   Aldosterone + renin activity w/ ratio     Status: Abnormal   Collection Time: 08/17/20  2:06 PM  Result Value Ref Range   PRA LC/MS/MS <0.167 (L) 0.167 - 5.380 ng/mL/hr    Comment: (NOTE) This test was developed and its performance characteristics determined by [facility]. It has not been cleared or approved by the Food and Drug Administration. Performed At: [facility] [address] , [place] [id number] Nagendra Sanjai MD Ph:[phone]    ALDO / PRA Ratio UNABLE TO CALCULATE     Comment: (NOTE) Unable to calculate result since non-numeric result obtained for component test.                         Units:      ng/dL per ng/mL/hr    Aldosterone <1.0 0.0 - 30.0 ng/dL    Comment: (NOTE) This test was developed and its performance characteristics determined by [facility]. It has not been cleared or approved by the Food and Drug Administration.   Vancomycin, trough     Status: Abnormal   Collection Time: 08/17/20  8:53 PM  Result Value Ref Range   Vancomycin Tr 9 (L) 15 - 20 ug/mL    Comment: Performed at [facility], [address] [place], [place] [address]  Basic metabolic panel     Status: Abnormal   Collection Time: 08/18/20   2:05 AM  Result Value Ref Range   Sodium 139 135 - 145 mmol/L   Potassium 3.3 (L) 3.5 - 5.1 mmol/L   Chloride 109 98 - 111 mmol/L   CO2 20 (L) 22 - 32 mmol/L   Glucose, Bld 119 (H) 70 - 99 mg/dL    Comment: Glucose reference range applies only to samples taken after fasting for at least 8 hours.   BUN 11 8 - 23 mg/dL   Creatinine, Ser 0.76 0.44 - 1.00 mg/dL   Calcium 8.8 (L) 8.9 - 10.3 mg/dL   GFR, Estimated >60 >60 mL/min    Comment: (NOTE) Calculated using the CKD-EPI Creatinine Equation (2021)    Anion gap 10 5 - 15    Comment: Performed at [facility], [address] [place], [place] [address]  Heparin level (unfractionated)     Status: None   Collection Time: 08/18/20  2:05 AM  Result Value Ref Range   Heparin Unfractionated 0.49 0.30 - 0.70 IU/mL    Comment: (NOTE) If heparin results are below expected values, and patient dosage has  been confirmed, suggest follow up testing of antithrombin III levels. Performed at [facility], [address] [place], [place] [address]   CBC     Status: Abnormal   Collection Time: 08/18/20  2:05 AM  Result Value Ref Range   WBC 9.8 4.0 - 10.5 K/uL   RBC 2.77 (L) 3.87 - 5.11 MIL/uL   Hemoglobin 8.3 (L) 12.0 - 15.0 g/dL   HCT 25.4 (L) 36.0 - 46.0 %   MCV 91.7 80.0 - 100.0 fL   MCH 30.0 26.0 - 34.0 pg   MCHC 32.7 30.0 - 36.0 g/dL   RDW 15.9 (H) 11.5 - 15.5 %   Platelets 208 150 - 400   K/uL   nRBC 0.0 0.0 - 0.2 %    Comment: Performed at [facility], [address] [place], [place] [address]  Magnesium     Status: None   Collection Time: 08/18/20  2:05 AM  Result Value Ref Range   Magnesium 1.9 1.7 - 2.4 mg/dL    Comment: Performed at [facility], [address] [place], [place] [address]  Heparin level (unfractionated)     Status: None   Collection Time: 08/19/20  4:52 AM  Result Value Ref Range   Heparin Unfractionated 0.47 0.30 - 0.70 IU/mL    Comment: (NOTE) If heparin results are below expected  values, and patient dosage has  been confirmed, suggest follow up testing of antithrombin III levels. Performed at [facility], [address] [place], [place] [address]   CBC     Status: Abnormal   Collection Time: 08/19/20  4:52 AM  Result Value Ref Range   WBC 9.2 4.0 - 10.5 K/uL   RBC 2.88 (L) 3.87 - 5.11 MIL/uL   Hemoglobin 8.4 (L) 12.0 - 15.0 g/dL   HCT 26.4 (L) 36.0 - 46.0 %   MCV 91.7 80.0 - 100.0 fL   MCH 29.2 26.0 - 34.0 pg   MCHC 31.8 30.0 - 36.0 g/dL   RDW 15.9 (H) 11.5 - 15.5 %   Platelets 207 150 - 400 K/uL   nRBC 0.0 0.0 - 0.2 %    Comment: Performed at [facility], [address] [place], [place] [address]  Basic metabolic panel     Status: Abnormal   Collection Time: 08/19/20  4:52 AM  Result Value Ref Range   Sodium 140 135 - 145 mmol/L   Potassium 3.3 (L) 3.5 - 5.1 mmol/L   Chloride 113 (H) 98 - 111 mmol/L   CO2 19 (L) 22 - 32 mmol/L   Glucose, Bld 102 (H) 70 - 99 mg/dL    Comment: Glucose reference range applies only to samples taken after fasting for at least 8 hours.   BUN 10 8 - 23 mg/dL   Creatinine, Ser 0.84 0.44 - 1.00 mg/dL   Calcium 8.9 8.9 - 10.3 mg/dL   GFR, Estimated >60 >60 mL/min    Comment: (NOTE) Calculated using the CKD-EPI Creatinine Equation (2021)    Anion gap 8 5 - 15    Comment: Performed at [facility], [address] [place], [place] [address]  Heparin level (unfractionated)     Status: None   Collection Time: 08/20/20  1:33 AM  Result Value Ref Range   Heparin Unfractionated 0.45 0.30 - 0.70 IU/mL    Comment: (NOTE) If heparin results are below expected values, and patient dosage has  been confirmed, suggest follow up testing of antithrombin III levels. Performed at  [facility], [address] [place],  [address]   CBC     Status: Abnormal   Collection Time: 08/20/20  1:33 AM  Result Value Ref Range   WBC 8.1 4.0 - 10.5 K/uL   RBC 2.81 (L) 3.87 - 5.11 MIL/uL   Hemoglobin 8.5 (L) 12.0  - 15.0 g/dL   HCT 25.5 (L) 36.0 - 46.0 %   MCV 90.7 80.0 - 100.0 fL   MCH 30.2 26.0 - 34.0 pg   MCHC 33.3 30.0 - 36.0 g/dL   RDW 15.9 (H) 11.5 - 15.5 %   Platelets 208 150 - 400 K/uL   nRBC 0.0   0.0 - 0.2 %    Comment: Performed at [facility], [address] [place], [place] [address]  Magnesium     Status: None   Collection Time: 08/20/20  1:33 AM  Result Value Ref Range   Magnesium 1.8 1.7 - 2.4 mg/dL    Comment: Performed at [facility], [address] [place], [place] [address]  Phosphorus     Status: None   Collection Time: 08/20/20  1:33 AM  Result Value Ref Range   Phosphorus 3.6 2.5 - 4.6 mg/dL    Comment: Performed at [facility], [address] [place], [place] [address]  Basic metabolic panel     Status: Abnormal   Collection Time: 08/20/20  1:33 AM  Result Value Ref Range   Sodium 140 135 - 145 mmol/L   Potassium 3.3 (L) 3.5 - 5.1 mmol/L   Chloride 113 (H) 98 - 111 mmol/L   CO2 19 (L) 22 - 32 mmol/L   Glucose, Bld 91 70 - 99 mg/dL    Comment: Glucose reference range applies only to samples taken after fasting for at least 8 hours.   BUN 11 8 - 23 mg/dL   Creatinine, Ser 0.71 0.44 - 1.00 mg/dL   Calcium 8.8 (L) 8.9 - 10.3 mg/dL   GFR, Estimated >60 >60 mL/min    Comment: (NOTE) Calculated using the CKD-EPI Creatinine Equation (2021)    Anion gap 8 5 - 15    Comment: Performed at [facility], [address] [place], [place] [address]  Heparin level (unfractionated)     Status: None   Collection Time: 08/21/20  1:14 AM  Result Value Ref Range   Heparin Unfractionated 0.35 0.30 - 0.70 IU/mL    Comment: (NOTE) If heparin results are below expected values, and patient dosage has  been confirmed, suggest follow up testing of antithrombin III levels. Performed at [facility], [address] [place], [place] [address]   CBC     Status: Abnormal   Collection Time: 08/21/20  1:14 AM  Result Value Ref Range   WBC 8.6 4.0 - 10.5  K/uL   RBC 2.93 (L) 3.87 - 5.11 MIL/uL   Hemoglobin 8.5 (L) 12.0 - 15.0 g/dL   HCT 27.5 (L) 36.0 - 46.0 %   MCV 93.9 80.0 - 100.0 fL   MCH 29.0 26.0 - 34.0 pg   MCHC 30.9 30.0 - 36.0 g/dL   RDW 15.9 (H) 11.5 - 15.5 %   Platelets 232 150 - 400 K/uL   nRBC 0.0 0.0 - 0.2 %    Comment: Performed at [facility], [address] , [place] [address]  Basic metabolic panel     Status: Abnormal   Collection Time: 08/21/20  1:14 AM  Result Value Ref Range   Sodium 136 135 - 145 mmol/L   Potassium 3.7 3.5 - 5.1 mmol/L   Chloride 111 98 - 111 mmol/L   CO2 17 (L) 22 - 32 mmol/L   Glucose, Bld 140 (H) 70 - 99 mg/dL    Comment: Glucose reference range applies only to samples taken after fasting for at least 8 hours.   BUN 15 8 - 23 mg/dL   Creatinine, Ser 0.85 0.44 - 1.00 mg/dL   Calcium 8.5 (L) 8.9 - 10.3 mg/dL   GFR, Estimated >60 >60 mL/min    Comment: (NOTE) Calculated using the CKD-EPI Creatinine Equation (2021)    Anion   gap 8 5 - 15    Comment: Performed at [facility], [address] [place], [place] [address]  Magnesium     Status: None   Collection Time: 08/21/20  1:14 AM  Result Value Ref Range   Magnesium 1.7 1.7 - 2.4 mg/dL    Comment: Performed at [facility], [address] [place], [place] [address]  Phosphorus     Status: None   Collection Time: 08/21/20  1:14 AM  Result Value Ref Range   Phosphorus 3.8 2.5 - 4.6 mg/dL    Comment: Performed at [facility], [address] [place], [place] [address]  Lactic acid, plasma     Status: None   Collection Time: 10/30/20  3:16 PM  Result Value Ref Range   Lactic Acid, Venous 1.4 0.5 - 1.9 mmol/L    Comment: Performed at [facility], [address] [place], [place] [address]  Comprehensive metabolic panel     Status: Abnormal   Collection Time: 10/30/20  3:16 PM  Result Value Ref Range   Sodium 143 135 - 145 mmol/L   Potassium 2.7 (LL) 3.5 - 5.1 mmol/L    Comment: CRITICAL  RESULT CALLED TO, READ BACK BY AND VERIFIED WITH STEPHANIE RUDD AT 1623 10/30/20 DAS    Chloride 103 98 - 111 mmol/L   CO2 29 22 - 32 mmol/L   Glucose, Bld 112 (H) 70 - 99 mg/dL    Comment: Glucose reference range applies only to samples taken after fasting for at least 8 hours.   BUN 17 8 - 23 mg/dL   Creatinine, Ser 0.67 0.44 - 1.00 mg/dL   Calcium 9.0 8.9 - 10.3 mg/dL   Total Protein 7.2 6.5 - 8.1 g/dL   Albumin 3.1 (L) 3.5 - 5.0 g/dL   AST 18 15 - 41 U/L   ALT 16 0 - 44 U/L   Alkaline Phosphatase 90 38 - 126 U/L   Total Bilirubin 0.6 0.3 - 1.2 mg/dL   GFR, Estimated >60 >60 mL/min    Comment: (NOTE) Calculated using the CKD-EPI Creatinine Equation (2021)    Anion gap 11 5 - 15    Comment: Performed at  Hospital Lab, [address] [place], [place] [address]  CBC with Differential     Status: Abnormal   Collection Time: 10/30/20  3:16 PM  Result Value Ref Range   WBC 8.4 4.0 - 10.5 K/uL   RBC 3.54 (L) 3.87 - 5.11 MIL/uL   Hemoglobin 9.3 (L) 12.0 - 15.0 g/dL   HCT 29.6 (L) 36.0 - 46.0 %   MCV 83.6 80.0 - 100.0 fL   MCH 26.3 26.0 - 34.0 pg   MCHC 31.4 30.0 - 36.0 g/dL   RDW 18.7 (H) 11.5 - 15.5 %   Platelets 242 150 - 400 K/uL   nRBC 0.0 0.0 - 0.2 %   Neutrophils Relative % 59 %   Neutro Abs 4.9 1.7 - 7.7 K/uL   Lymphocytes Relative 31 %   Lymphs Abs 2.6 0.7 - 4.0 K/uL   Monocytes Relative 9 %   Monocytes Absolute 0.7 0.1 - 1.0 K/uL   Eosinophils Relative 1 %   Eosinophils Absolute 0.1 0.0 - 0.5 K/uL   Basophils Relative 0 %   Basophils Absolute 0.0 0.0 - 0.1 K/uL   Immature Granulocytes 0 %   Abs Immature Granulocytes 0.03 0.00 - 0.07 K/uL    Comment: Performed at  Hospital Lab, [address] [place], [place] [address]  Urinalysis, Complete w Microscopic Urine, Random     Status: Abnormal   Collection Time: 10/30/20  5:13 PM  Result Value Ref Range   Color, Urine STRAW (A) YELLOW   APPearance CLEAR (A) CLEAR   Specific Gravity, Urine 1.008  1.005 - 1.030   pH 5.0 5.0 - 8.0   Glucose, UA NEGATIVE NEGATIVE mg/dL   Hgb urine dipstick SMALL (A) NEGATIVE   Bilirubin Urine NEGATIVE NEGATIVE   Ketones, ur NEGATIVE NEGATIVE mg/dL   Protein, ur NEGATIVE NEGATIVE mg/dL   Nitrite NEGATIVE NEGATIVE   Leukocytes,Ua TRACE (A) NEGATIVE   RBC / HPF 0-5 0 - 5 RBC/hpf   WBC, UA 6-10 0 - 5 WBC/hpf   Bacteria, UA RARE (A) NONE SEEN   Squamous Epithelial / LPF 0-5 0 - 5    Comment: Performed at [facility], [address] [place], [place] [address]  CBC with Differential     Status: Abnormal   Collection Time: 11/04/20  2:39 PM  Result Value Ref Range   WBC 9.6 4.0 - 10.5 K/uL   RBC 3.74 (L) 3.87 - 5.11 MIL/uL   Hemoglobin 9.8 (L) 12.0 - 15.0 g/dL   HCT 31.2 (L) 36.0 - 46.0 %   MCV 83.4 80.0 - 100.0 fL   MCH 26.2 26.0 - 34.0 pg   MCHC 31.4 30.0 - 36.0 g/dL   RDW 19.0 (H) 11.5 - 15.5 %   Platelets 234 150 - 400 K/uL   nRBC 0.0 0.0 - 0.2 %   Neutrophils Relative % 70 %   Neutro Abs 6.7 1.7 - 7.7 K/uL   Lymphocytes Relative 22 %   Lymphs Abs 2.1 0.7 - 4.0 K/uL   Monocytes Relative 7 %   Monocytes Absolute 0.7 0.1 - 1.0 K/uL   Eosinophils Relative 1 %   Eosinophils Absolute 0.1 0.0 - 0.5 K/uL   Basophils Relative 0 %   Basophils Absolute 0.0 0.0 - 0.1 K/uL   Immature Granulocytes 0 %   Abs Immature Granulocytes 0.02 0.00 - 0.07 K/uL    Comment: Performed at [facility], [address] , [place] [address]  Comprehensive metabolic panel     Status: Abnormal   Collection Time: 11/04/20  2:39 PM  Result Value Ref Range   Sodium 142 135 - 145 mmol/L   Potassium 2.1 (LL) 3.5 - 5.1 mmol/L    Comment: CRITICAL RESULT CALLED TO, READ BACK BY AND VERIFIED WITH ELIZABETH GANNON @1510 11/04/20 MJU    Chloride 101 98 - 111 mmol/L   CO2 29 22 - 32 mmol/L   Glucose, Bld 128 (H) 70 - 99 mg/dL    Comment: Glucose reference range applies only to samples taken after fasting for at least 8 hours.   BUN 21 8 - 23  mg/dL   Creatinine, Ser 0.98 0.44 - 1.00 mg/dL   Calcium 8.6 (L) 8.9 - 10.3 mg/dL   Total Protein 7.3 6.5 - 8.1 g/dL   Albumin 3.0 (L) 3.5 - 5.0 g/dL   AST 17 15 - 41 U/L   ALT 14 0 - 44 U/L   Alkaline Phosphatase 95 38 - 126 U/L   Total Bilirubin 0.6 0.3 - 1.2 mg/dL   GFR, Estimated >60 >60 mL/min    Comment: (NOTE) Calculated using the CKD-EPI Creatinine Equation (2021)    Anion gap 12 5 - 15    Comment: Performed at [facility], [address] [place], [place] [address]  Magnesium     Status: None   Collection Time: 11/04/20  3:18 PM  Result Value Ref Range   Magnesium 1.9 1.7 - 2.4 mg/dL    Comment: Performed at [facility], [address] [place], [place] [address]  SARS CORONAVIRUS 2 (TAT 6-24 HRS) Nasopharyngeal Nasopharyngeal Swab     Status: None   Collection Time: 11/04/20  4:16 PM   Specimen: Nasopharyngeal Swab  Result Value Ref Range   SARS Coronavirus 2 NEGATIVE NEGATIVE    Comment: (NOTE) SARS-CoV-2 target nucleic acids are NOT DETECTED.  The SARS-CoV-2 RNA is generally detectable in upper and lower respiratory specimens during the acute phase of infection. Negative results do not preclude SARS-CoV-2 infection, do not rule out co-infections with other pathogens, and should not be used as the sole basis for treatment or other patient management decisions. Negative results must be combined with clinical observations, patient history, and epidemiological information. The expected result is Negative.  Fact Sheet for Patients: https://www.fda.gov/media/138098/download  Fact Sheet for Healthcare Providers: https://www.fda.gov/media/138095/download  This test is not yet approved or cleared by the [place] FDA and  has been authorized for detection and/or diagnosis of SARS-CoV-2 by FDA under an Emergency Use Authorization (EUA). This EUA will remain  in effect (meaning this test can be used) for the duration of the COVID-19  declaration under Se ction 564(b)(1) of the Act, 21 U.S.C. section 360bbb-3(b)(1), unless the authorization is terminated or revoked sooner.  Performed at [facility], [address] [place], [place] [address]   Basic metabolic panel     Status: Abnormal   Collection Time: 11/04/20  6:52 PM  Result Value Ref Range   Sodium 140 135 - 145 mmol/L   Potassium 2.8 (L) 3.5 - 5.1 mmol/L   Chloride 101 98 - 111 mmol/L   CO2 29 22 - 32 mmol/L   Glucose, Bld 89 70 - 99 mg/dL    Comment: Glucose reference range applies only to samples taken after fasting for at least 8 hours.   BUN 20 8 - 23 mg/dL   Creatinine, Ser 0.78 0.44 - 1.00 mg/dL   Calcium 8.5 (L) 8.9 - 10.3 mg/dL   GFR, Estimated >60 >60 mL/min    Comment: (NOTE) Calculated using the CKD-EPI Creatinine Equation (2021)    Anion gap 10 5 - 15    Comment: Performed at [facility], [address] [place], [place] [address]    Assessment/Plan:  Atherosclerosis of native arteries of the extremities with gangrene (HCC) The patient has severe peripheral arterial disease and is status post an attempted a femoral to peroneal bypass another institution which was not technically feasible.  She has gangrenous tissue at the base of her left great toe from the previous amputation site.  She does not have enough flow to heal that wound or the lower incision from the bypass attempt.  She has been recommended to have an amputation which is certainly a reasonable option.  It would appear that the care she has received has been appropriate but she does have very severe disease.  I discussed with her that as best I can tell I do not think there is any true attempted endovascular revascularization I am certainly willing to try that before performing an amputation.  That might also determine whether or not a below-knee amputation may be an option or if we will have to do an above-knee amputation.  I discussed the critical   and limb  threatening nature of the situation.  I have been very frank and honest with the patient and her daughter and told them that I think that amputation is the most likely outcome and that is true even with successful revascularization.  She has significant tissue loss.  They are interested in trying anything that could potentially offer a limb salvage option, so we will plan on an angiogram this week for further evaluation and potential treatment.  Essential hypertension blood pressure control important in reducing the progression of atherosclerotic disease. On appropriate oral medications.   Leg swelling Swelling is fairly significant bilaterally, but until her perfusion is restored where she proceeds to amputation, I would not wrap her in Unna boots or do any sort of compression.  Tobacco abuse Represents a significant atherosclerotic risk factor with her severe disease.      Yuval Rubens 11/05/2020, 11:36 AM   This note was created with Dragon medical transcription system.  Any errors from dictation are unintentional.   

## 2020-11-06 ENCOUNTER — Other Ambulatory Visit (INDEPENDENT_AMBULATORY_CARE_PROVIDER_SITE_OTHER): Payer: Self-pay | Admitting: Nurse Practitioner

## 2020-11-06 ENCOUNTER — Encounter: Payer: Self-pay | Admitting: Vascular Surgery

## 2020-11-06 ENCOUNTER — Encounter
Admission: RE | Disposition: A | Discharge status: Home / Self Care | Payer: Self-pay | Source: Home / Self Care | Attending: Vascular Surgery

## 2020-11-06 ENCOUNTER — Ambulatory Visit
Admission: RE | Admit: 2020-11-06 | Discharge: 2020-11-06 | Disposition: A | Discharge status: Home / Self Care | Payer: Medicaid Other | Attending: Vascular Surgery | Admitting: Vascular Surgery

## 2020-11-06 ENCOUNTER — Other Ambulatory Visit: Payer: Self-pay

## 2020-11-06 DIAGNOSIS — L97529 Non-pressure chronic ulcer of other part of left foot with unspecified severity: Secondary | ICD-10-CM

## 2020-11-06 DIAGNOSIS — Z8249 Family history of ischemic heart disease and other diseases of the circulatory system: Secondary | ICD-10-CM | POA: Diagnosis not present

## 2020-11-06 DIAGNOSIS — F17210 Nicotine dependence, cigarettes, uncomplicated: Secondary | ICD-10-CM | POA: Diagnosis not present

## 2020-11-06 DIAGNOSIS — Z9049 Acquired absence of other specified parts of digestive tract: Secondary | ICD-10-CM | POA: Insufficient documentation

## 2020-11-06 DIAGNOSIS — Z89412 Acquired absence of left great toe: Secondary | ICD-10-CM | POA: Insufficient documentation

## 2020-11-06 DIAGNOSIS — I10 Essential (primary) hypertension: Secondary | ICD-10-CM | POA: Insufficient documentation

## 2020-11-06 DIAGNOSIS — Z7901 Long term (current) use of anticoagulants: Secondary | ICD-10-CM | POA: Diagnosis not present

## 2020-11-06 DIAGNOSIS — I70262 Atherosclerosis of native arteries of extremities with gangrene, left leg: Secondary | ICD-10-CM | POA: Insufficient documentation

## 2020-11-06 DIAGNOSIS — Z79899 Other long term (current) drug therapy: Secondary | ICD-10-CM | POA: Insufficient documentation

## 2020-11-06 DIAGNOSIS — I739 Peripheral vascular disease, unspecified: Secondary | ICD-10-CM

## 2020-11-06 HISTORY — PX: LOWER EXTREMITY ANGIOGRAPHY: CATH118251

## 2020-11-06 LAB — CREATININE, SERUM
Creatinine, Ser: 0.6 mg/dL (ref 0.44–1.00)
GFR, Estimated: >60 mL/min (ref >60)

## 2020-11-06 LAB — BUN: BUN: 24 mg/dL — ABNORMAL HIGH (ref 8–23)

## 2020-11-06 SURGERY — LOWER EXTREMITY ANGIOGRAPHY
Anesthesia: Moderate Sedation | Site: Leg Lower

## 2020-11-06 MED ORDER — ASPIRIN EC 81 MG PO TBEC: 81.0000 mg | DELAYED_RELEASE_TABLET | Freq: Every day | ORAL | Status: DC

## 2020-11-06 MED ORDER — NITROGLYCERIN 1 MG/10 ML FOR IR/CATH LAB
INTRA_ARTERIAL | Status: AC
  Filled 2020-11-06: qty 10

## 2020-11-06 MED ORDER — IODIXANOL 320 MG/ML IV SOLN
INTRAVENOUS | Status: DC | PRN
  Administered 2020-11-06: 30 mL

## 2020-11-06 MED ORDER — FENTANYL CITRATE (PF) 100 MCG/2ML IJ SOLN
INTRAMUSCULAR | Status: DC | PRN
  Administered 2020-11-06: 25 ug via INTRAVENOUS
  Administered 2020-11-06: 50 ug via INTRAVENOUS

## 2020-11-06 MED ORDER — CEFAZOLIN SODIUM-DEXTROSE 2-4 GM/100ML-% IV SOLN: 2.0000 g | Freq: Once | INTRAVENOUS | Status: AC

## 2020-11-06 MED ORDER — LABETALOL HCL 5 MG/ML IV SOLN
INTRAVENOUS | Status: AC
  Administered 2020-11-06: 10 mg via INTRAVENOUS
  Filled 2020-11-06: qty 4

## 2020-11-06 MED ORDER — HEPARIN SODIUM (PORCINE) 1000 UNIT/ML IJ SOLN
INTRAMUSCULAR | Status: DC | PRN
  Administered 2020-11-06: 4000 [IU] via INTRAVENOUS

## 2020-11-06 MED ORDER — ATORVASTATIN CALCIUM 10 MG PO TABS: 10.0000 mg | ORAL_TABLET | Freq: Every day | ORAL | 11 refills | Status: AC

## 2020-11-06 MED ORDER — DIPHENHYDRAMINE HCL 50 MG/ML IJ SOLN
INTRAMUSCULAR | Status: AC
  Filled 2020-11-06: qty 1

## 2020-11-06 MED ORDER — HYDROMORPHONE HCL 1 MG/ML IJ SOLN: 1.0000 mg | Freq: Once | INTRAMUSCULAR | Status: DC | PRN

## 2020-11-06 MED ORDER — DIPHENHYDRAMINE HCL 50 MG/ML IJ SOLN: 50.0000 mg | Freq: Once | INTRAMUSCULAR | Status: DC | PRN

## 2020-11-06 MED ORDER — ATORVASTATIN CALCIUM 10 MG PO TABS: 10.0000 mg | ORAL_TABLET | Freq: Every day | ORAL | Status: DC

## 2020-11-06 MED ORDER — SODIUM CHLORIDE 0.9% FLUSH: 3.0000 mL | INTRAVENOUS | Status: DC | PRN

## 2020-11-06 MED ORDER — MIDAZOLAM HCL 2 MG/2ML IJ SOLN
INTRAMUSCULAR | Status: DC | PRN
  Administered 2020-11-06: 1 mg via INTRAVENOUS
  Administered 2020-11-06: 2 mg via INTRAVENOUS

## 2020-11-06 MED ORDER — ONDANSETRON HCL 4 MG/2ML IJ SOLN
4.0000 mg | Freq: Four times a day (QID) | INTRAMUSCULAR | Status: DC | PRN
Start: 2020-11-06 — End: 2020-11-06

## 2020-11-06 MED ORDER — CEFAZOLIN SODIUM-DEXTROSE 2-4 GM/100ML-% IV SOLN
INTRAVENOUS | Status: AC
  Administered 2020-11-06: 2 g via INTRAVENOUS
  Filled 2020-11-06: qty 100

## 2020-11-06 MED ORDER — ONDANSETRON HCL 4 MG/2ML IJ SOLN: 4.0000 mg | Freq: Four times a day (QID) | INTRAMUSCULAR | Status: DC | PRN

## 2020-11-06 MED ORDER — ACETAMINOPHEN 325 MG PO TABS: 650.0000 mg | ORAL_TABLET | ORAL | Status: DC | PRN

## 2020-11-06 MED ORDER — FENTANYL CITRATE (PF) 100 MCG/2ML IJ SOLN
INTRAMUSCULAR | Status: AC
  Filled 2020-11-06: qty 2

## 2020-11-06 MED ORDER — SODIUM CHLORIDE 0.9% FLUSH: 3.0000 mL | Freq: Two times a day (BID) | INTRAVENOUS | Status: DC

## 2020-11-06 MED ORDER — LABETALOL HCL 5 MG/ML IV SOLN
INTRAVENOUS | Status: DC | PRN
  Administered 2020-11-06: 10 mg via INTRAVENOUS

## 2020-11-06 MED ORDER — FAMOTIDINE 20 MG PO TABS: 40.0000 mg | ORAL_TABLET | Freq: Once | ORAL | Status: DC | PRN

## 2020-11-06 MED ORDER — HYDRALAZINE HCL 20 MG/ML IJ SOLN: 5.0000 mg | INTRAMUSCULAR | Status: DC | PRN

## 2020-11-06 MED ORDER — DIPHENHYDRAMINE HCL 50 MG/ML IJ SOLN
INTRAMUSCULAR | Status: DC | PRN
  Administered 2020-11-06: 25 mg via INTRAVENOUS

## 2020-11-06 MED ORDER — LABETALOL HCL 5 MG/ML IV SOLN
10.0000 mg | INTRAVENOUS | Status: DC | PRN
  Administered 2020-11-06: 10 mg via INTRAVENOUS

## 2020-11-06 MED ORDER — HEPARIN SODIUM (PORCINE) 1000 UNIT/ML IJ SOLN
INTRAMUSCULAR | Status: AC
  Filled 2020-11-06: qty 1

## 2020-11-06 MED ORDER — LABETALOL HCL 5 MG/ML IV SOLN
INTRAVENOUS | Status: AC
  Filled 2020-11-06: qty 4

## 2020-11-06 MED ORDER — METHYLPREDNISOLONE SODIUM SUCC 125 MG IJ SOLR: 125.0000 mg | Freq: Once | INTRAMUSCULAR | Status: DC | PRN

## 2020-11-06 MED ORDER — NITROGLYCERIN 1 MG/10 ML FOR IR/CATH LAB
INTRA_ARTERIAL | Status: DC | PRN
  Administered 2020-11-06: 400 ug

## 2020-11-06 MED ORDER — MIDAZOLAM HCL 2 MG/ML PO SYRP: 8.0000 mg | ORAL_SOLUTION | Freq: Once | ORAL | Status: DC | PRN

## 2020-11-06 MED ORDER — ASPIRIN EC 81 MG PO TBEC: 81.0000 mg | DELAYED_RELEASE_TABLET | Freq: Every day | ORAL | 2 refills | Status: AC

## 2020-11-06 MED ORDER — SODIUM CHLORIDE 0.9 % IV SOLN: 250.0000 mL | INTRAVENOUS | Status: DC | PRN

## 2020-11-06 MED ORDER — SODIUM CHLORIDE 0.9 % IV SOLN: INTRAVENOUS | Status: DC

## 2020-11-06 MED ORDER — MIDAZOLAM HCL 5 MG/5ML IJ SOLN
INTRAMUSCULAR | Status: AC
  Filled 2020-11-06: qty 5

## 2020-11-06 SURGICAL SUPPLY — 24 items
BALLN DORADO 6X80X80 (BALLOONS) ×2
BALLN LUTONIX 018 4X150X130 (BALLOONS) ×2
BALLN LUTONIX 018 5X220X130 (BALLOONS) ×4
BALLN ULTRVRSE 2.5X220X150 (BALLOONS) ×2
BALLOON DORADO 6X80X80 (BALLOONS) IMPLANT
BALLOON LUTONIX 018 4X150X130 (BALLOONS) IMPLANT
BALLOON LUTONIX 018 5X220X130 (BALLOONS) IMPLANT
BALLOON ULTRVRSE 2.5X220X150 (BALLOONS) IMPLANT
CATH ANGIO 5F PIGTAIL 65CM (CATHETERS) ×1 IMPLANT
CATH NAVICROSS ANGLED 135CM (MICROCATHETER) ×1 IMPLANT
CATH VERT 5X100 (CATHETERS) ×1 IMPLANT
COVER PROBE U/S 5X48 (MISCELLANEOUS) ×1 IMPLANT
DEVICE SAFEGUARD 24CM (GAUZE/BANDAGES/DRESSINGS) ×1 IMPLANT
DEVICE STARCLOSE SE CLOSURE (Vascular Products) ×1 IMPLANT
GLIDEWIRE ADV .035X260CM (WIRE) ×1 IMPLANT
KIT ENCORE 26 ADVANTAGE (KITS) ×1 IMPLANT
PACK ANGIOGRAPHY (CUSTOM PROCEDURE TRAY) ×2 IMPLANT
SHEATH ANL2 6FRX45 HC (SHEATH) ×1 IMPLANT
SHEATH BRITE TIP 5FRX11 (SHEATH) ×2 IMPLANT
STENT VIABAHN 6X250X120 (Permanent Stent) ×1 IMPLANT
SYR MEDRAD MARK 7 150ML (SYRINGE) ×1 IMPLANT
TUBING CONTRAST HIGH PRESS 48 (TUBING) ×2 IMPLANT
WIRE G V18X300CM (WIRE) ×1 IMPLANT
WIRE GUIDERIGHT .035X150 (WIRE) ×1 IMPLANT

## 2020-11-06 NOTE — Interval H&P Note (Signed)
History and Physical Interval Note:  11/06/2020 11:05 AM  Sarah Bonilla  has presented today for surgery, with the diagnosis of LLE Angiography   PAD   BARD Rep  cc: [name].  The various methods of treatment have been discussed with the patient and family. After consideration of risks, benefits and other options for treatment, the patient has consented to  Procedure(s): LOWER EXTREMITY ANGIOGRAPHY (N/A) as a surgical intervention.  The patient's history has been reviewed, patient examined, no change in status, stable for surgery.  I have reviewed the patient's chart and labs.  Questions were answered to the patient's satisfaction.     [name]

## 2020-11-06 NOTE — Op Note (Signed)
[facility]  Percutaneous Study/Intervention Procedural Note   Date of Surgery: 11/06/2020  Surgeon(s):Ambur Province    Assistants:none  Pre-operative Diagnosis: PAD with ulceration and gangrenous changes left foot  Post-operative diagnosis:  Same  Procedure(s) Performed:             1.  Ultrasound guidance for vascular access right femoral artery             2.  Catheter placement into left common femoral artery from right femoral approach             3.  Aortogram and selective left lower extremity angiogram             4.  Percutaneous transluminal angioplasty of left peroneal artery with 2.5 mm diameter angioplasty balloon and of the tibioperoneal trunk with 4 mm diameter Lutonix drug-coated angioplasty balloon             5.   Percutaneous transluminal angioplasty of the left mid to distal SFA and popliteal arteries with 5 mm diameter by 22 cm length Lutonix drug-coated angioplasty balloon  6.  Viabahn stent placement to the left SFA and popliteal arteries with a 6 mm diameter by 25 cm length stent for multiple areas of greater than 50% stenosis after angioplasty             7.  StarClose closure device right femoral artery  EBL: 10 cc  Contrast: 30 cc  Fluoro Time: 6.4 minutes  Moderate Conscious Sedation Time: approximately 39 minutes using 3 mg of Versed and 75 mcg of Fentanyl              Indications:  Patient is a 65 y.o.female with multiple ulcerations and gangrenous changes to the left great toe amputation site.  She was treated at another institution amputation which is certainly reasonable option but she requests a second opinion and an attempt at limb salvage. The patient is brought in for angiography for further evaluation and potential treatment.  Due to the limb threatening nature of the situation, angiogram was performed for attempted limb salvage. The patient is aware that if the procedure fails, amputation would be expected.  The patient also  understands that even with successful revascularization, amputation may still be required due to the severity of the situation.  Risks and benefits are discussed and informed consent is obtained.   Procedure:  The patient was identified and appropriate procedural time out was performed.  The patient was then placed supine on the table and prepped and draped in the usual sterile fashion. Moderate conscious sedation was administered during a face to face encounter with the patient throughout the procedure with my supervision of the RN administering medicines and monitoring the patient's vital signs, pulse oximetry, telemetry and mental status throughout from the start of the procedure until the patient was taken to the recovery room. Ultrasound was used to evaluate the right common femoral artery.  It was patent .  A digital ultrasound image was acquired.  A Seldinger needle was used to access the right common femoral artery under direct ultrasound guidance and a permanent image was performed.  A 0.035 J wire was advanced without resistance and a 5Fr sheath was placed.  Pigtail catheter was placed into the aorta and an AP aortogram was performed. This demonstrated normal renal arteries and normal aorta and iliac segments without significant stenosis. I then crossed the aortic bifurcation and advanced to the left femoral head. Selective left lower extremity  angiogram was then performed. This demonstrated 2 areas of greater than 80% stenosis in the mid and distal SFA with the tightest 1 at Hunter's canal.  The popliteal artery then became heavily diseased in the below-knee popliteal artery and was nearly occluded.  The tibioperoneal trunk and peroneal artery were then occluded with reconstitution of the mid peroneal artery as the only runoff distally.  The anterior tibial and posterior tibial arteries were chronically occluded without distal reconstitution. It was felt that it was in the patient's best interest to  proceed with intervention after these images to avoid a second procedure and a larger amount of contrast and fluoroscopy based off of the findings from the initial angiogram. The patient was systemically heparinized and a 6 [place] Ansell sheath was then placed over the [employer] wire. I then used a Kumpe catheter and the advantage wire to navigate down through the SFA disease and was able to cross the disease in the popliteal artery, tibioperoneal trunk, and proximal peroneal artery with the advantage wire after exchanging for a Nava cross catheter.  Intraluminal flow was confirmed in the mid to distal peroneal artery and then exchanged for a 0.018 wire.  I then proceeded with treatment.  A 2.5 mm diameter by 22 cm length angioplasty balloon was inflated to 10 atm for 1 minute from the mid peroneal artery up to the tibioperoneal trunk.  The popliteal artery and mid to distal SFA were then treated with 2 inflations with a 5 mm diameter by 22 cm length Lutonix drug-coated angioplasty balloon inflated to 10 to 12 atm for 1 minute.  Completion imaging showed the TP trunk still to have residual disease of greater than 50% and multiple areas in the distal SFA and popliteal artery had greater than 50% residual stenosis.  I elected to place a Viabahn stent in the distal SFA and the popliteal artery and selected a 6 mm diameter by 25 cm length Viabahn stent stenting just above the origin of the anterior tibial artery which occluded only a couple centimeters beyond the origin but did provide a little collateral flow distally.  This was postdilated with a 5 mm balloon and 1 area at Hunter's canal required a 6 mm diameter high-pressure angioplasty balloon but this resulted in less than 10% residual stenosis.  The tibioperoneal trunk was addressed with a 4 mm diameter by 15 cm length Lutonix drug-coated angioplasty balloon taken back up into the distal popliteal artery as well.  This is inflated to 8 atm for 1 minute.   After intra-arterial nitroglycerin for vasospasm, completion imaging showed about a 20 to 25% residual stenosis in the tibioperoneal trunk with much better flow distally. I elected to terminate the procedure. The sheath was removed and StarClose closure device was deployed in the right femoral artery with excellent hemostatic result. The patient was taken to the recovery room in stable condition having tolerated the procedure well.  Findings:               Aortogram:  Normal renal arteries, normal aorta and iliac arteries without significant stenosis             Left lower Extremity:  This demonstrated 2 areas of greater than 80% stenosis in the mid and distal SFA with the tightest 1 at Hunter's canal.  The popliteal artery then became heavily diseased in the below-knee popliteal artery and was nearly occluded.  The tibioperoneal trunk and peroneal artery were then occluded with reconstitution of the mid peroneal  artery as the only runoff distally.  The anterior tibial and posterior tibial arteries were chronically occluded without distal reconstitution   Disposition: Patient was taken to the recovery room in stable condition having tolerated the procedure well.  Complications: None  Sarah Bonilla 11/06/2020 1:21 PM   This note was created with Dragon Medical transcription system. Any errors in dictation are purely unintentional.

## 2020-11-07 ENCOUNTER — Encounter: Payer: Self-pay | Admitting: Vascular Surgery

## 2020-11-13 ENCOUNTER — Inpatient Hospital Stay (HOSPITAL_COMMUNITY)
Admission: EM | Admit: 2020-11-13 | Discharge: 2020-11-16 | DRG: 291 | Disposition: A | Discharge status: Home / Self Care | Payer: Medicaid Other | Attending: Internal Medicine | Admitting: Internal Medicine

## 2020-11-13 ENCOUNTER — Encounter (HOSPITAL_COMMUNITY): Payer: Self-pay | Admitting: Emergency Medicine

## 2020-11-13 ENCOUNTER — Other Ambulatory Visit: Payer: Self-pay

## 2020-11-13 ENCOUNTER — Emergency Department (HOSPITAL_COMMUNITY): Payer: Medicaid Other

## 2020-11-13 DIAGNOSIS — R0902 Hypoxemia: Secondary | ICD-10-CM

## 2020-11-13 DIAGNOSIS — I110 Hypertensive heart disease with heart failure: Principal | ICD-10-CM | POA: Diagnosis present

## 2020-11-13 DIAGNOSIS — S81802A Unspecified open wound, left lower leg, initial encounter: Secondary | ICD-10-CM | POA: Diagnosis present

## 2020-11-13 DIAGNOSIS — I2510 Atherosclerotic heart disease of native coronary artery without angina pectoris: Secondary | ICD-10-CM | POA: Diagnosis present

## 2020-11-13 DIAGNOSIS — Z681 Body mass index (BMI) 19 or less, adult: Secondary | ICD-10-CM

## 2020-11-13 DIAGNOSIS — Z95828 Presence of other vascular implants and grafts: Secondary | ICD-10-CM

## 2020-11-13 DIAGNOSIS — J449 Chronic obstructive pulmonary disease, unspecified: Secondary | ICD-10-CM | POA: Diagnosis present

## 2020-11-13 DIAGNOSIS — Z9049 Acquired absence of other specified parts of digestive tract: Secondary | ICD-10-CM

## 2020-11-13 DIAGNOSIS — E441 Mild protein-calorie malnutrition: Secondary | ICD-10-CM | POA: Diagnosis present

## 2020-11-13 DIAGNOSIS — Z9119 Patient's noncompliance with other medical treatment and regimen: Secondary | ICD-10-CM

## 2020-11-13 DIAGNOSIS — Z7901 Long term (current) use of anticoagulants: Secondary | ICD-10-CM

## 2020-11-13 DIAGNOSIS — I5033 Acute on chronic diastolic (congestive) heart failure: Secondary | ICD-10-CM | POA: Diagnosis present

## 2020-11-13 DIAGNOSIS — J9601 Acute respiratory failure with hypoxia: Secondary | ICD-10-CM | POA: Diagnosis present

## 2020-11-13 DIAGNOSIS — R64 Cachexia: Secondary | ICD-10-CM | POA: Diagnosis present

## 2020-11-13 DIAGNOSIS — R9431 Abnormal electrocardiogram [ECG] [EKG]: Secondary | ICD-10-CM

## 2020-11-13 DIAGNOSIS — R778 Other specified abnormalities of plasma proteins: Secondary | ICD-10-CM | POA: Diagnosis present

## 2020-11-13 DIAGNOSIS — E876 Hypokalemia: Secondary | ICD-10-CM

## 2020-11-13 DIAGNOSIS — Z89412 Acquired absence of left great toe: Secondary | ICD-10-CM

## 2020-11-13 DIAGNOSIS — K219 Gastro-esophageal reflux disease without esophagitis: Secondary | ICD-10-CM | POA: Diagnosis present

## 2020-11-13 DIAGNOSIS — R7989 Other specified abnormal findings of blood chemistry: Secondary | ICD-10-CM | POA: Diagnosis present

## 2020-11-13 DIAGNOSIS — D72829 Elevated white blood cell count, unspecified: Secondary | ICD-10-CM

## 2020-11-13 DIAGNOSIS — I248 Other forms of acute ischemic heart disease: Secondary | ICD-10-CM | POA: Diagnosis present

## 2020-11-13 DIAGNOSIS — F17210 Nicotine dependence, cigarettes, uncomplicated: Secondary | ICD-10-CM | POA: Diagnosis present

## 2020-11-13 DIAGNOSIS — E8809 Other disorders of plasma-protein metabolism, not elsewhere classified: Secondary | ICD-10-CM | POA: Diagnosis present

## 2020-11-13 DIAGNOSIS — D649 Anemia, unspecified: Secondary | ICD-10-CM | POA: Diagnosis present

## 2020-11-13 DIAGNOSIS — I161 Hypertensive emergency: Secondary | ICD-10-CM | POA: Diagnosis present

## 2020-11-13 DIAGNOSIS — Z9114 Patient's other noncompliance with medication regimen: Secondary | ICD-10-CM

## 2020-11-13 DIAGNOSIS — I340 Nonrheumatic mitral (valve) insufficiency: Secondary | ICD-10-CM | POA: Diagnosis present

## 2020-11-13 DIAGNOSIS — Z7982 Long term (current) use of aspirin: Secondary | ICD-10-CM

## 2020-11-13 DIAGNOSIS — I739 Peripheral vascular disease, unspecified: Secondary | ICD-10-CM | POA: Diagnosis present

## 2020-11-13 DIAGNOSIS — I10 Essential (primary) hypertension: Secondary | ICD-10-CM | POA: Diagnosis present

## 2020-11-13 DIAGNOSIS — Z716 Tobacco abuse counseling: Secondary | ICD-10-CM

## 2020-11-13 DIAGNOSIS — Z20822 Contact with and (suspected) exposure to covid-19: Secondary | ICD-10-CM | POA: Diagnosis present

## 2020-11-13 DIAGNOSIS — Z8249 Family history of ischemic heart disease and other diseases of the circulatory system: Secondary | ICD-10-CM

## 2020-11-13 DIAGNOSIS — Z79899 Other long term (current) drug therapy: Secondary | ICD-10-CM

## 2020-11-13 LAB — CBC WITH DIFFERENTIAL/PLATELET
Abs Immature Granulocytes: 0.12 10*3/uL — ABNORMAL HIGH (ref 0.00–0.07)
Basophils Absolute: 0 10*3/uL (ref 0.0–0.1)
Basophils Relative: 0 %
Eosinophils Absolute: 0.1 10*3/uL (ref 0.0–0.5)
Eosinophils Relative: 0 %
HCT: 26.2 % — ABNORMAL LOW (ref 36.0–46.0)
Hemoglobin: 8.1 g/dL — ABNORMAL LOW (ref 12.0–15.0)
Immature Granulocytes: 1 %
Lymphocytes Relative: 16 %
Lymphs Abs: 2.8 10*3/uL (ref 0.7–4.0)
MCH: 26.6 pg (ref 26.0–34.0)
MCHC: 30.9 g/dL (ref 30.0–36.0)
MCV: 85.9 fL (ref 80.0–100.0)
Monocytes Absolute: 0.5 10*3/uL (ref 0.1–1.0)
Monocytes Relative: 3 %
Neutro Abs: 14.5 10*3/uL — ABNORMAL HIGH (ref 1.7–7.7)
Neutrophils Relative %: 80 %
Platelets: 327 10*3/uL (ref 150–400)
RBC: 3.05 MIL/uL — ABNORMAL LOW (ref 3.87–5.11)
RDW: 18.8 % — ABNORMAL HIGH (ref 11.5–15.5)
WBC: 18 10*3/uL — ABNORMAL HIGH (ref 4.0–10.5)
nRBC: 0 % (ref 0.0–0.2)

## 2020-11-13 NOTE — ED Triage Notes (Signed)
Ems called for sob pt was 77% on room air they placed her on nrb at 15lpm.  Pt is scheduled for amputation of left leg at [place]. Per ems pt may have maggots in wound on leg.

## 2020-11-14 ENCOUNTER — Encounter (HOSPITAL_COMMUNITY): Payer: Self-pay | Admitting: Internal Medicine

## 2020-11-14 ENCOUNTER — Telehealth (INDEPENDENT_AMBULATORY_CARE_PROVIDER_SITE_OTHER): Payer: Self-pay | Admitting: Vascular Surgery

## 2020-11-14 ENCOUNTER — Inpatient Hospital Stay (HOSPITAL_COMMUNITY): Payer: Medicaid Other

## 2020-11-14 DIAGNOSIS — R64 Cachexia: Secondary | ICD-10-CM | POA: Diagnosis present

## 2020-11-14 DIAGNOSIS — I161 Hypertensive emergency: Secondary | ICD-10-CM | POA: Diagnosis present

## 2020-11-14 DIAGNOSIS — I10 Essential (primary) hypertension: Secondary | ICD-10-CM

## 2020-11-14 DIAGNOSIS — E876 Hypokalemia: Secondary | ICD-10-CM

## 2020-11-14 DIAGNOSIS — K219 Gastro-esophageal reflux disease without esophagitis: Secondary | ICD-10-CM | POA: Diagnosis present

## 2020-11-14 DIAGNOSIS — I739 Peripheral vascular disease, unspecified: Secondary | ICD-10-CM | POA: Diagnosis present

## 2020-11-14 DIAGNOSIS — I5033 Acute on chronic diastolic (congestive) heart failure: Secondary | ICD-10-CM | POA: Diagnosis present

## 2020-11-14 DIAGNOSIS — E441 Mild protein-calorie malnutrition: Secondary | ICD-10-CM | POA: Diagnosis present

## 2020-11-14 DIAGNOSIS — S81802S Unspecified open wound, left lower leg, sequela: Secondary | ICD-10-CM

## 2020-11-14 DIAGNOSIS — I340 Nonrheumatic mitral (valve) insufficiency: Secondary | ICD-10-CM | POA: Diagnosis present

## 2020-11-14 DIAGNOSIS — D72829 Elevated white blood cell count, unspecified: Secondary | ICD-10-CM

## 2020-11-14 DIAGNOSIS — S81802A Unspecified open wound, left lower leg, initial encounter: Secondary | ICD-10-CM | POA: Diagnosis present

## 2020-11-14 DIAGNOSIS — I110 Hypertensive heart disease with heart failure: Secondary | ICD-10-CM | POA: Diagnosis present

## 2020-11-14 DIAGNOSIS — D649 Anemia, unspecified: Secondary | ICD-10-CM | POA: Diagnosis present

## 2020-11-14 DIAGNOSIS — F17210 Nicotine dependence, cigarettes, uncomplicated: Secondary | ICD-10-CM | POA: Diagnosis present

## 2020-11-14 DIAGNOSIS — I2510 Atherosclerotic heart disease of native coronary artery without angina pectoris: Secondary | ICD-10-CM | POA: Diagnosis present

## 2020-11-14 DIAGNOSIS — R778 Other specified abnormalities of plasma proteins: Secondary | ICD-10-CM | POA: Diagnosis present

## 2020-11-14 DIAGNOSIS — Z20822 Contact with and (suspected) exposure to covid-19: Secondary | ICD-10-CM | POA: Diagnosis present

## 2020-11-14 DIAGNOSIS — J449 Chronic obstructive pulmonary disease, unspecified: Secondary | ICD-10-CM | POA: Diagnosis present

## 2020-11-14 DIAGNOSIS — Z9049 Acquired absence of other specified parts of digestive tract: Secondary | ICD-10-CM | POA: Diagnosis not present

## 2020-11-14 DIAGNOSIS — J439 Emphysema, unspecified: Secondary | ICD-10-CM

## 2020-11-14 DIAGNOSIS — Z681 Body mass index (BMI) 19 or less, adult: Secondary | ICD-10-CM | POA: Diagnosis not present

## 2020-11-14 DIAGNOSIS — Z89412 Acquired absence of left great toe: Secondary | ICD-10-CM | POA: Diagnosis not present

## 2020-11-14 DIAGNOSIS — Z95828 Presence of other vascular implants and grafts: Secondary | ICD-10-CM | POA: Diagnosis not present

## 2020-11-14 DIAGNOSIS — R9431 Abnormal electrocardiogram [ECG] [EKG]: Secondary | ICD-10-CM

## 2020-11-14 DIAGNOSIS — E8809 Other disorders of plasma-protein metabolism, not elsewhere classified: Secondary | ICD-10-CM

## 2020-11-14 DIAGNOSIS — J9601 Acute respiratory failure with hypoxia: Secondary | ICD-10-CM

## 2020-11-14 DIAGNOSIS — I160 Hypertensive urgency: Secondary | ICD-10-CM

## 2020-11-14 DIAGNOSIS — Z7901 Long term (current) use of anticoagulants: Secondary | ICD-10-CM | POA: Diagnosis not present

## 2020-11-14 DIAGNOSIS — Z8249 Family history of ischemic heart disease and other diseases of the circulatory system: Secondary | ICD-10-CM | POA: Diagnosis not present

## 2020-11-14 DIAGNOSIS — I509 Heart failure, unspecified: Secondary | ICD-10-CM | POA: Insufficient documentation

## 2020-11-14 DIAGNOSIS — R7989 Other specified abnormal findings of blood chemistry: Secondary | ICD-10-CM

## 2020-11-14 DIAGNOSIS — I4892 Unspecified atrial flutter: Secondary | ICD-10-CM

## 2020-11-14 DIAGNOSIS — I248 Other forms of acute ischemic heart disease: Secondary | ICD-10-CM | POA: Diagnosis present

## 2020-11-14 LAB — BRAIN NATRIURETIC PEPTIDE: B Natriuretic Peptide: 2921 pg/mL — ABNORMAL HIGH (ref 0.0–100.0)

## 2020-11-14 LAB — COMPREHENSIVE METABOLIC PANEL
ALT: 16 U/L (ref 0–44)
AST: 21 U/L (ref 15–41)
Albumin: 2.7 g/dL — ABNORMAL LOW (ref 3.5–5.0)
Alkaline Phosphatase: 104 U/L (ref 38–126)
Anion gap: 11 (ref 5–15)
BUN: 18 mg/dL (ref 8–23)
CO2: 24 mmol/L (ref 22–32)
Calcium: 8.5 mg/dL — ABNORMAL LOW (ref 8.9–10.3)
Chloride: 106 mmol/L (ref 98–111)
Creatinine, Ser: 0.63 mg/dL (ref 0.44–1.00)
GFR, Estimated: >60 mL/min (ref >60)
Glucose, Bld: 118 mg/dL — ABNORMAL HIGH (ref 70–99)
Potassium: 2.7 mmol/L — CL (ref 3.5–5.1)
Sodium: 141 mmol/L (ref 135–145)
Total Bilirubin: 0.7 mg/dL (ref 0.3–1.2)
Total Protein: 7.2 g/dL (ref 6.5–8.1)

## 2020-11-14 LAB — PROTIME-INR
INR: 1.2 (ref 0.8–1.2)
Prothrombin Time: 15.2 seconds (ref 11.4–15.2)

## 2020-11-14 LAB — RESP PANEL BY RT-PCR (FLU A&B, COVID) ARPGX2
Influenza A by PCR: NEGATIVE
Influenza B by PCR: NEGATIVE
SARS Coronavirus 2 by RT PCR: NEGATIVE

## 2020-11-14 LAB — TROPONIN I (HIGH SENSITIVITY)
Troponin I (High Sensitivity): 161 ng/L (ref <18)
Troponin I (High Sensitivity): 292 ng/L (ref <18)
Troponin I (High Sensitivity): 991 ng/L (ref <18)
Troponin I (High Sensitivity): 628 ng/L (ref <18)

## 2020-11-14 LAB — PROCALCITONIN: Procalcitonin: 2.19 ng/mL

## 2020-11-14 LAB — BASIC METABOLIC PANEL
Anion gap: 11 (ref 5–15)
BUN: 19 mg/dL (ref 8–23)
CO2: 27 mmol/L (ref 22–32)
Calcium: 8.5 mg/dL — ABNORMAL LOW (ref 8.9–10.3)
Chloride: 103 mmol/L (ref 98–111)
Creatinine, Ser: 0.6 mg/dL (ref 0.44–1.00)
GFR, Estimated: >60 mL/min (ref >60)
Glucose, Bld: 98 mg/dL (ref 70–99)
Potassium: 3.6 mmol/L (ref 3.5–5.1)
Sodium: 141 mmol/L (ref 135–145)

## 2020-11-14 LAB — PHOSPHORUS
Phosphorus: 5.2 mg/dL — ABNORMAL HIGH (ref 2.5–4.6)
Phosphorus: 5.6 mg/dL — ABNORMAL HIGH (ref 2.5–4.6)

## 2020-11-14 LAB — MAGNESIUM: Magnesium: 1.7 mg/dL (ref 1.7–2.4)

## 2020-11-14 MED ORDER — CARVEDILOL 12.5 MG PO TABS
25.0000 mg | ORAL_TABLET | Freq: Two times a day (BID) | ORAL | Status: DC
  Administered 2020-11-14 – 2020-11-16 (×4): 25 mg via ORAL
  Filled 2020-11-14 (×4): qty 2

## 2020-11-14 MED ORDER — MAGNESIUM OXIDE -MG SUPPLEMENT 400 (240 MG) MG PO TABS
800.0000 mg | ORAL_TABLET | Freq: Once | ORAL | Status: AC
  Administered 2020-11-14: 800 mg via ORAL
  Filled 2020-11-14: qty 2

## 2020-11-14 MED ORDER — FUROSEMIDE 10 MG/ML IJ SOLN
80.0000 mg | Freq: Once | INTRAMUSCULAR | Status: AC
  Administered 2020-11-14: 80 mg via INTRAVENOUS
  Filled 2020-11-14: qty 8

## 2020-11-14 MED ORDER — HYDROCODONE-ACETAMINOPHEN 5-325 MG PO TABS
1.0000 | ORAL_TABLET | Freq: Four times a day (QID) | ORAL | Status: DC | PRN
  Administered 2020-11-14 – 2020-11-16 (×5): 1 via ORAL
  Filled 2020-11-14 (×5): qty 1

## 2020-11-14 MED ORDER — FUROSEMIDE 10 MG/ML IJ SOLN
40.0000 mg | Freq: Two times a day (BID) | INTRAMUSCULAR | Status: DC
  Administered 2020-11-14 – 2020-11-15 (×3): 40 mg via INTRAVENOUS
  Filled 2020-11-14 (×3): qty 4

## 2020-11-14 MED ORDER — TECHNETIUM TO 99M ALBUMIN AGGREGATED
4.0000 | Freq: Once | INTRAVENOUS | Status: AC | PRN
  Administered 2020-11-14: 4.4 via INTRAVENOUS

## 2020-11-14 MED ORDER — DILTIAZEM HCL ER COATED BEADS 180 MG PO CP24
360.0000 mg | ORAL_CAPSULE | Freq: Every day | ORAL | Status: DC
  Administered 2020-11-14 – 2020-11-16 (×3): 360 mg via ORAL
  Filled 2020-11-14 (×3): qty 2

## 2020-11-14 MED ORDER — HYDROMORPHONE HCL 1 MG/ML IJ SOLN
1.0000 mg | Freq: Once | INTRAMUSCULAR | Status: AC
  Administered 2020-11-14: 1 mg via INTRAVENOUS
  Filled 2020-11-14: qty 1

## 2020-11-14 MED ORDER — NITROGLYCERIN IN D5W 200-5 MCG/ML-% IV SOLN
0.0000 ug/min | INTRAVENOUS | Status: DC
  Administered 2020-11-14: 5 ug/min via INTRAVENOUS
  Filled 2020-11-14 (×4): qty 250

## 2020-11-14 MED ORDER — ATORVASTATIN CALCIUM 10 MG PO TABS
10.0000 mg | ORAL_TABLET | Freq: Every day | ORAL | Status: DC
  Administered 2020-11-14 – 2020-11-16 (×3): 10 mg via ORAL
  Filled 2020-11-14 (×3): qty 1

## 2020-11-14 MED ORDER — LISINOPRIL 10 MG PO TABS
40.0000 mg | ORAL_TABLET | Freq: Every day | ORAL | Status: DC
  Administered 2020-11-14 – 2020-11-16 (×3): 40 mg via ORAL
  Filled 2020-11-14 (×3): qty 4

## 2020-11-14 MED ORDER — POTASSIUM CHLORIDE CRYS ER 20 MEQ PO TBCR
80.0000 meq | EXTENDED_RELEASE_TABLET | Freq: Once | ORAL | Status: DC
  Filled 2020-11-14: qty 4

## 2020-11-14 MED ORDER — CHLORHEXIDINE GLUCONATE CLOTH 2 % EX PADS
6.0000 | MEDICATED_PAD | Freq: Every day | CUTANEOUS | Status: DC
  Administered 2020-11-15 – 2020-11-16 (×2): 6 via TOPICAL

## 2020-11-14 MED ORDER — ENOXAPARIN SODIUM 60 MG/0.6ML IJ SOSY
45.0000 mg | PREFILLED_SYRINGE | Freq: Two times a day (BID) | INTRAMUSCULAR | Status: DC
  Administered 2020-11-14 – 2020-11-15 (×3): 45 mg via SUBCUTANEOUS
  Filled 2020-11-14 (×3): qty 0.6

## 2020-11-14 MED ORDER — ALBUTEROL SULFATE HFA 108 (90 BASE) MCG/ACT IN AERS: 1.0000 | INHALATION_SPRAY | Freq: Four times a day (QID) | RESPIRATORY_TRACT | Status: DC | PRN

## 2020-11-14 MED ORDER — POTASSIUM CHLORIDE 10 MEQ/100ML IV SOLN
10.0000 meq | INTRAVENOUS | Status: AC
  Administered 2020-11-14 (×5): 10 meq via INTRAVENOUS
  Filled 2020-11-14 (×5): qty 100

## 2020-11-14 MED ORDER — MAGNESIUM SULFATE 2 GM/50ML IV SOLN
2.0000 g | Freq: Once | INTRAVENOUS | Status: AC
  Administered 2020-11-14: 2 g via INTRAVENOUS
  Filled 2020-11-14: qty 50

## 2020-11-14 MED ORDER — HEPARIN SODIUM (PORCINE) 5000 UNIT/ML IJ SOLN
5000.0000 [IU] | Freq: Three times a day (TID) | INTRAMUSCULAR | Status: DC
  Administered 2020-11-14: 5000 [IU] via SUBCUTANEOUS
  Filled 2020-11-14: qty 1

## 2020-11-14 MED ORDER — ACETAMINOPHEN 325 MG PO TABS
650.0000 mg | ORAL_TABLET | Freq: Four times a day (QID) | ORAL | Status: DC | PRN
  Administered 2020-11-14 – 2020-11-16 (×3): 650 mg via ORAL
  Filled 2020-11-14 (×3): qty 2

## 2020-11-14 MED ORDER — HYDRALAZINE HCL 25 MG PO TABS
100.0000 mg | ORAL_TABLET | Freq: Three times a day (TID) | ORAL | Status: DC
  Administered 2020-11-14 – 2020-11-15 (×3): 100 mg via ORAL
  Filled 2020-11-14 (×3): qty 4

## 2020-11-14 MED ORDER — ISOSORBIDE MONONITRATE ER 60 MG PO TB24: 60.0000 mg | ORAL_TABLET | Freq: Every day | ORAL | Status: DC

## 2020-11-14 MED ORDER — ASPIRIN EC 81 MG PO TBEC
81.0000 mg | DELAYED_RELEASE_TABLET | Freq: Every day | ORAL | Status: DC
  Administered 2020-11-14 – 2020-11-16 (×3): 81 mg via ORAL
  Filled 2020-11-14 (×3): qty 1

## 2020-11-14 MED ORDER — POTASSIUM CHLORIDE CRYS ER 20 MEQ PO TBCR
40.0000 meq | EXTENDED_RELEASE_TABLET | Freq: Once | ORAL | Status: AC
  Administered 2020-11-14: 40 meq via ORAL
  Filled 2020-11-14: qty 2

## 2020-11-14 NOTE — ED Notes (Signed)
Pt's granddaughter Celine Ahr and daughter Claris Che updated via telephone. Claris Che CB# [phone] for updates

## 2020-11-14 NOTE — ED Notes (Signed)
Spoke with pt's granddaughter for an update; stated she would call back later and check on her; informed granddaughter of pt's ICU room number

## 2020-11-14 NOTE — ED Notes (Signed)
Update provided to granddaughter Celine Ahr via telephone

## 2020-11-14 NOTE — Consult Note (Addendum)
Cardiology Consultation:   Patient ID: Sarah Bonilla MRN: [id number]; DOB: 1955/12/08  Admit date: 11/13/2020 Date of Consult: 11/14/2020  PCP:  Avon Gully, MD   [facility] Providers Cardiologist:  Dina Rich, MD   {  Consult requested by: Frankey Shown, DO   History of Present Illness:   Ms. [name] is a 65 yo female with a history of medication non-compliance, poorly controlled hypertension (with prior admissions for hypertensive emergency in Nov 2021 and Mar 2022), diastolic CHF, paroxysmal atrial flutter (on Eliquis), tobacco use, PAD (left toe gangrene s/p amputation 07/2020, LE angio 6/82022), COPD, and GERD, who was admitted for acute onset of SOB. Patient ran out of her BP meds a week ago.  Initial O2 sat 77% on RA when evaluated by EMS. At [facility] ED noted to be tachycardiac. BP 213/117 mmHg. Labs were: hs-Trops 161, 292 and 628. BNP 2921. CXR showed emphysema, cardiomegaly with central congestion diffuse, and ground-glass opacity, suspected acute superimposed edema. Treated in the ED with a BiPAP and IV Lasix (80 mg x 1). She denied any chest pain.   Previous pertinent testing: Echo 08/12/2020: LVEF 55 to 60%, HK of distal anteroseptal & anterolateral walls, akinesis of the apex. LV diastolic parameters being consistent with grade 2 diastolic dysfunction. Severe LAE, mod to severe MR. Mod TR.    Past Medical History:  Diagnosis Date   GERD (gastroesophageal reflux disease)    Hypertension    Peripheral arterial disease (HCC)     Past Surgical History:  Procedure Laterality Date   ABDOMINAL AORTOGRAM W/LOWER EXTREMITY Bilateral 08/16/2020   Procedure: ABDOMINAL AORTOGRAM W/LOWER EXTREMITY;  Surgeon: Sherren Kerns, MD;  Location: [facility] INVASIVE CV LAB;  Service: Cardiovascular;  Laterality: Bilateral;   AMPUTATION Left 08/20/2020   Procedure: LEFT FIRST TOE AMPUTATION;  Surgeon: Sherren Kerns, MD;  Location: [facility] OR;  Service: Vascular;   Laterality: Left;   BYPASS GRAFT FEMORAL-PERONEAL Left 08/20/2020   Procedure: exploration left peroneal artery ;  Surgeon: Sherren Kerns, MD;  Location: [facility] OR;  Service: Vascular;  Laterality: Left;   CHOLECYSTECTOMY     LOWER EXTREMITY ANGIOGRAPHY N/A 11/06/2020   Procedure: LOWER EXTREMITY ANGIOGRAPHY;  Surgeon: Annice Needy, MD;  Location: [facility] INVASIVE CV LAB;  Service: Cardiovascular;  Laterality: N/A;   VEIN HARVEST Left 08/20/2020   Procedure: VEIN HARVEST LEFT GREATER SAPHENOUS VEIN;  Surgeon: Sherren Kerns, MD;  Location: [facility] OR;  Service: Vascular;  Laterality: Left;     Home Medications:  Prior to Admission medications   Medication Sig Start Date End Date Taking? Authorizing Provider  acetaminophen (TYLENOL) 325 MG tablet Take 650 mg by mouth every 6 (six) hours as needed for moderate pain or headache.    [provider]  albuterol (VENTOLIN HFA) 108 (90 Base) MCG/ACT inhaler Inhale 1 puff into the lungs 4 (four) times daily as needed for wheezing or shortness of breath. 10/09/20   [provider]  apixaban (ELIQUIS) 5 MG TABS tablet TAKE 1 TABLET (5 MG TOTAL) BY MOUTH TWO TIMES DAILY. 08/21/20 08/21/21  Cipriano Bunker, MD  aspirin EC 81 MG tablet Take 1 tablet (81 mg total) by mouth daily. 11/06/20   Annice Needy, MD  atorvastatin (LIPITOR) 10 MG tablet Take 1 tablet (10 mg total) by mouth daily. 11/06/20 11/06/21  Annice Needy, MD  carvedilol (COREG) 25 MG tablet Take 1 tablet (25 mg total) by mouth 2 (two) times daily with a meal. 04/24/20  Vassie Loll, MD  diltiazem (CARDIZEM CD) 360 MG 24 hr capsule Take 1 capsule (360 mg total) by mouth daily. 08/22/20   Cipriano Bunker, MD  enoxaparin (LOVENOX) 40 MG/0.4ML injection Inject 0.4 mLs (40 mg total) into the skin every 12 (twelve) hours for 3 days. 10/25/20 10/28/20  Sherren Kerns, MD  furosemide (LASIX) 40 MG tablet Take 40 mg by mouth daily. 09/05/20   [provider]  hydrALAZINE (APRESOLINE) 100 MG  tablet Take 1 tablet (100 mg total) by mouth every 8 (eight) hours. 08/21/20   Cipriano Bunker, MD  HYDROcodone-acetaminophen (NORCO/VICODIN) 5-325 MG tablet Take 1 tablet by mouth every 6 (six) hours as needed for severe pain. 11/04/20   Jene Every, MD  ibuprofen (ADVIL) 200 MG tablet Take 400 mg by mouth every 6 (six) hours as needed for mild pain.    [provider]  isosorbide mononitrate (IMDUR) 60 MG 24 hr tablet Take 1 tablet (60 mg total) by mouth daily. 08/22/20   Cipriano Bunker, MD  lisinopril (ZESTRIL) 40 MG tablet Take 1 tablet (40 mg total) by mouth daily. 04/25/20   Vassie Loll, MD  losartan (COZAAR) 50 MG tablet Take 50 mg by mouth daily. Patient not taking: Reported on 11/06/2020 09/05/20   [provider]  pantoprazole (PROTONIX) 40 MG tablet Take 1 tablet (40 mg total) by mouth daily. Patient not taking: Reported on 11/04/2020 04/25/20   Vassie Loll, MD  potassium chloride (KLOR-CON) 10 MEQ tablet Take 10 mEq by mouth daily. 09/05/20   [provider]  potassium chloride SA (KLOR-CON) 20 MEQ tablet Take 1 tablet (20 mEq total) by mouth 2 (two) times daily for 10 days. 11/04/20 11/14/20  Jene Every, MD    Inpatient Medications: Scheduled Meds:  enoxaparin (LOVENOX) injection  45 mg Subcutaneous Q12H   furosemide  40 mg Intravenous Q12H   potassium chloride SA  80 mEq Oral Once   Continuous Infusions:  nitroGLYCERIN 115 mcg/min (11/14/20 0533)   PRN Meds: acetaminophen, HYDROcodone-acetaminophen, technetium albumin aggregated  Allergies:   No Known Allergies  Social History:   Social History   Socioeconomic History   Marital status: Widowed    Spouse name: Not on file   Number of children: Not on file   Years of education: Not on file   Highest education level: Not on file  Occupational History   Not on file  Tobacco Use   Smoking status: Every Day    Packs/day: 1.00    Pack years: 0.00    Types: Cigarettes   Smokeless tobacco: Never   Vaping Use   Vaping Use: Some days  Substance and Sexual Activity   Alcohol use: Yes    Comment: occasionally    Drug use: Never   Sexual activity: Not on file  Other Topics Concern   Not on file  Social History Narrative   Not on file   Social Determinants of Health   Financial Resource Strain: Not on file  Food Insecurity: Not on file  Transportation Needs: Not on file  Physical Activity: Not on file  Stress: Not on file  Social Connections: Not on file  Intimate Partner Violence: Not on file    Family History:   Family History  Problem Relation Age of Onset   CVA Mother    Hypertension Father      ROS:  Please see the history of present illness.  All other ROS reviewed and negative.     Physical Exam/Data:  Vitals:   11/14/20 0800 11/14/20 0915 11/14/20 0930 11/14/20 1000  BP: (!) 167/102 (!) 157/92 (!) 148/87 (!) 167/88  Pulse: 95 91  93  Resp: (!) 24 (!) 22 (!) 22 (!) 23  Temp:      TempSrc:      SpO2: 100% 97%  99%  Weight:      Height:       No intake or output data in the 24 hours ending 11/14/20 1055 Last 3 Weights 11/13/2020 11/06/2020 11/05/2020  Weight (lbs) 99 lb 3.3 oz 98 lb 3.2 oz 98 lb  Weight (kg) 45 kg 44.543 kg 44.453 kg     Body mass index is 18.15 kg/m.  General:  cachectic, some distress HEENT: normal Lymph: no adenopathy Neck: no JVD Endocrine:  No thryomegaly Vascular: No carotid bruits Cardiac:  normal S1, S2; RRR; no murmur Lungs:  tachypneic, +accessory muscle use Abd: soft, nontender, no hepatomegaly  Ext: left leg with bandage Musculoskeletal:  No deformities, BUE and BLE strength normal and equal Skin: warm and dry  Neuro:  CNs 2-12 intact, no focal abnormalities noted Psych:  Normal affect   EKG:  The EKG was personally reviewed and demonstrates:  Sinus tachycardia at a rate of 108 bpm, LVH with repol abnormalities, with prolonged QTc (506 ms) and PVCs   Laboratory Data:  High Sensitivity Troponin:   Recent Labs   Lab 11/13/20 2321 11/14/20 0128 11/14/20 0834 11/14/20 1013  TROPONINIHS 161* 292* 991* 628*     Chemistry Recent Labs  Lab 11/13/20 2321 11/14/20 0530  NA 141 141  K 2.7* 3.6  CL 106 103  CO2 24 27  GLUCOSE 118* 98  BUN 18 19  CREATININE 0.63 0.60  CALCIUM 8.5* 8.5*  GFRNONAA >60 >60  ANIONGAP 11 11    Recent Labs  Lab 11/13/20 2321  PROT 7.2  ALBUMIN 2.7*  AST 21  ALT 16  ALKPHOS 104  BILITOT 0.7   Hematology Recent Labs  Lab 11/13/20 2321  WBC 18.0*  RBC 3.05*  HGB 8.1*  HCT 26.2*  MCV 85.9  MCH 26.6  MCHC 30.9  RDW 18.8*  PLT 327   BNP Recent Labs  Lab 11/13/20 2321  BNP 2,921.0*    DDimer No results for input(s): DDIMER in the last 168 hours.   Radiology/Studies:  DG Chest Portable 1 View  Result Date: 11/13/2020 CLINICAL DATA:  Shortness of breath EXAM: PORTABLE CHEST 1 VIEW COMPARISON:  08/10/2020, CT 08/11/2020 FINDINGS: Hyperinflation with emphysematous disease. Diffuse increased right greater than left interstitial and ground-glass opacity. There is mild cardiomegaly with central congestion. Aortic atherosclerosis. Peripheral lucency in the left thorax suspect due to artifact that extends external to the chest wall. IMPRESSION: 1. Emphysema 2. Cardiomegaly with central congestion. Diffuse increased right greater than left interstitial and ground-glass opacity, suspect acute superimposed edema or diffuse pneumonia on underlying chronic disease 3. Peripheral lucency in the left chest favored to be due to external artifact as opposed to pneumothorax. Electronically Signed   By: Jasmine PangKim  Fujinaga M.D.   On: 11/13/2020 23:55     Assessment and Plan:   Hypertensive urgency / emergency Patient presents with acute onset of SOB. Noted to be very hypertensive in the ED (SBP >200 mmHg), CXR evidence of vascular congestion, consistent with acute pulmonary edema. BNP 2921. Trops are elevated (161, 292, 991 and 628)  -IV vasodilators for BP control (agree  with IV NTG infusion for the short term) -Diurese PRN -Monitor UOP, Bun/Cr -  Strict I and Os -Daily weights -Restart oral anti-hypertensive regimen  2. Troponin elevation Not consistent with ACS. Likely demand ischemia. ECG with sinus tachycardia, rate 108 bpm, LVH and repol abnormalities (ST segment changes). Previous CTA documented severe coronary calcifications (? MVD).  -IV heparin for 48 hours -ASA daily  3. Prolonged Qtc  -Replenish K and Mg (maintain K>4.0 and Mg>2.0) -Avoid QT prolonging drugs  4. Paroxysmal Atrial Flutter CHADS2VASc=4 Currently sinus  -Resume Eliquis after course of IV heparin completed -Resume AV nodal blockers (BB, CCB)    For questions or updates, please contact [facility] Please consult [email or web address] for contact info under    Signed, Lonie Peak, MD  11/14/2020 10:55 AM

## 2020-11-14 NOTE — Progress Notes (Signed)
Removed patient from Bipap at this time. Placed on 3lpm  [place]. She is not short of breath and BBS are adequate at this time. RN notified.

## 2020-11-14 NOTE — H&P (Signed)
History and Physical  Sarah Bonilla:[id number] DOB: 1955/06/17 DOA: 11/13/2020  Referring physician: Marily Memos, MD PCP: Avon Gully, MD Patient coming from:   Chief Complaint: Shortness of breath  HPI: Sarah Bonilla is a 65 y.o. female with medical history significant for CHF, COPD, hypertension, tobacco abuse, noncompliance with medication regimen, PAD, GERD who presents to the emergency department via EMS due to worsening shortness of breath.  Patient had difficulty in being able to provide a history due to on BiPAP, history was obtained from ED physician and ED medical record.  Per report, patient was having worsening of shortness of breath, so she activated EMS, on arrival of EMS, she was noted to have an O2 sat of 77% on room air, she was provided with supplemental oxygen via NRB at 15 LPM.  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 She was admitted from 08/10/2020 to 08/21/2020 due to left great toe pain secondary to cellulitis/dry gangrene in the setting of moderate PAD at which time she presented with hypertensive emergency which was treated with Cardene drip. She was also admitted from 11/19 - 04/24/2020 due to acute respiratory failure with hypoxia secondary to diastolic CHF and hypertensive emergency.  ED Course:  In the emergency department, she was tachypneic and tachycardic, BP was 213/117, O2 sat was 100% on BiPAP at FiO2 of 60%.  Work-up in the ED showed leukocytosis, normocytic anemia, elevated troponin x 2- 161 > 292, BNP 2921, hypokalemia, hypoalbuminemia, influenza A, B, SARS coronavirus 2 was negative. Chest x-ray showed emphysema, cardiomegaly with central congestion diffuse increased right greater than left interstitial and ground-glass opacity, suspect acute superimposed edema or diffuse pneumonia on underlying chronic disease IV Lasix 80 mg x 1 was given, IV Dilaudid was given, potassium  was replenished and patient was started on IV nitroglycerin drip due to hypertensive emergency.  Review of Systems: Constitutional: Negative for chills and fever.  HENT: Negative for ear pain and sore throat.   Eyes: Negative for pain and visual disturbance.  Respiratory: Positive for shortness of breath.  Negative for cough   Cardiovascular: Negative for chest pain and palpitations.  Gastrointestinal: Negative for abdominal pain and vomiting.  Endocrine: Negative for polyphagia and polyuria.  Genitourinary: Negative for decreased urine volume, dysuria, enuresis Musculoskeletal: Positive for left leg pain.  Negative for back pain.  Skin: Negative for color change and rash.  Allergic/Immunologic: Negative for immunocompromised state.  Neurological: Negative for tremors, syncope, speech difficulty Hematological: Does not bruise/bleed easily.  All other systems reviewed and are negative   Past Medical History:  Diagnosis Date   GERD (gastroesophageal reflux disease)    Hypertension    Peripheral arterial disease (HCC)    Past Surgical History:  Procedure Laterality Date   ABDOMINAL AORTOGRAM W/LOWER EXTREMITY Bilateral 08/16/2020   Procedure: ABDOMINAL AORTOGRAM W/LOWER EXTREMITY;  Surgeon: Sherren Kerns, MD;  Location: [facility] INVASIVE CV LAB;  Service: Cardiovascular;  Laterality: Bilateral;   AMPUTATION Left 08/20/2020   Procedure: LEFT FIRST TOE AMPUTATION;  Surgeon: Sherren Kerns, MD;  Location: [facility] OR;  Service: Vascular;  Laterality: Left;   BYPASS GRAFT FEMORAL-PERONEAL Left 08/20/2020   Procedure: exploration left peroneal artery ;  Surgeon: Sherren Kerns, MD;  Location: [facility] OR;  Service: Vascular;  Laterality: Left;   CHOLECYSTECTOMY     LOWER EXTREMITY ANGIOGRAPHY N/A 11/06/2020   Procedure: LOWER EXTREMITY ANGIOGRAPHY;  Surgeon: Annice Needy, MD;  Location: [facility]  INVASIVE CV LAB;  Service: Cardiovascular;  Laterality: N/A;   VEIN HARVEST Left 08/20/2020   Procedure: VEIN  HARVEST LEFT GREATER SAPHENOUS VEIN;  Surgeon: Sherren KernsFields, Charles E, MD;  [facility];  Service: Vascular;  Laterality: Left;    Social History:  reports that she has been smoking cigarettes. She has been smoking an average of 1.00 packs per day. She has never used smokeless tobacco. She reports current alcohol use. She reports that she does not use drugs.   No Known Allergies  Family History  Problem Relation Age of Onset   CVA Mother    Hypertension Father      Prior to Admission medications   Medication Sig Start Date End Date Taking? Authorizing Provider  acetaminophen (TYLENOL) 325 MG tablet Take 650 mg by mouth every 6 (six) hours as needed for moderate pain or headache.    [provider]  albuterol (VENTOLIN HFA) 108 (90 Base) MCG/ACT inhaler Inhale 1 puff into the lungs 4 (four) times daily as needed for wheezing or shortness of breath. 10/09/20   [provider]  apixaban (ELIQUIS) 5 MG TABS tablet TAKE 1 TABLET (5 MG TOTAL) BY MOUTH TWO TIMES DAILY. 08/21/20 08/21/21  Cipriano BunkerKumar, Pardeep, MD  aspirin EC 81 MG tablet Take 1 tablet (81 mg total) by mouth daily. 11/06/20   Annice Needyew, Jason S, MD  atorvastatin (LIPITOR) 10 MG tablet Take 1 tablet (10 mg total) by mouth daily. 11/06/20 11/06/21  Annice Needyew, Jason S, MD  carvedilol (COREG) 25 MG tablet Take 1 tablet (25 mg total) by mouth 2 (two) times daily with a meal. 04/24/20   Vassie LollMadera, Carlos, MD  diltiazem (CARDIZEM CD) 360 MG 24 hr capsule Take 1 capsule (360 mg total) by mouth daily. 08/22/20   Cipriano BunkerKumar, Pardeep, MD  enoxaparin (LOVENOX) 40 MG/0.4ML injection Inject 0.4 mLs (40 mg total) into the skin every 12 (twelve) hours for 3 days. 10/25/20 10/28/20  Sherren KernsFields, Charles E, MD  furosemide (LASIX) 40 MG tablet Take 40 mg by mouth daily. 09/05/20   [provider]  hydrALAZINE (APRESOLINE) 100 MG tablet Take 1 tablet (100 mg total) by mouth every 8 (eight) hours. 08/21/20   Cipriano BunkerKumar, Pardeep, MD  HYDROcodone-acetaminophen (NORCO/VICODIN)  5-325 MG tablet Take 1 tablet by mouth every 6 (six) hours as needed for severe pain. 11/04/20   Jene EveryKinner, Robert, MD  ibuprofen (ADVIL) 200 MG tablet Take 400 mg by mouth every 6 (six) hours as needed for mild pain.    [provider]  isosorbide mononitrate (IMDUR) 60 MG 24 hr tablet Take 1 tablet (60 mg total) by mouth daily. 08/22/20   Cipriano BunkerKumar, Pardeep, MD  lisinopril (ZESTRIL) 40 MG tablet Take 1 tablet (40 mg total) by mouth daily. 04/25/20   Vassie LollMadera, Carlos, MD  losartan (COZAAR) 50 MG tablet Take 50 mg by mouth daily. Patient not taking: Reported on 11/06/2020 09/05/20   [provider]  pantoprazole (PROTONIX) 40 MG tablet Take 1 tablet (40 mg total) by mouth daily. Patient not taking: Reported on 11/04/2020 04/25/20   Vassie LollMadera, Carlos, MD  potassium chloride (KLOR-CON) 10 MEQ tablet Take 10 mEq by mouth daily. 09/05/20   [provider]  potassium chloride SA (KLOR-CON) 20 MEQ tablet Take 1 tablet (20 mEq total) by mouth 2 (two) times daily for 10 days. 11/04/20 11/14/20  Jene EveryKinner, Robert, MD    Physical Exam: BP (!) 167/102   Pulse 95   Temp 98.1 F (36.7 C) (Oral)   Resp (!) 24  Ht 5\' 2"  (1.575 m)   Wt 45 kg   SpO2 100%   BMI 18.15 kg/m   General: 65 y.o. year-old cachectic female in some acute distress.  Alert and oriented x3. HEENT: NCAT, EOMI Neck: Supple, trachea medial Cardiovascular: Tachycardic.  Regular rate and rhythm with no rubs or gallops.  Increased JVD noted. Respiratory: Tachypnea.  Decreased breath sounds and respiratory distress with use of accessory muscles. Abdomen: Soft, nontender nondistended with normal bowel sounds x4 quadrants. Muskuloskeletal: Bilateral lower extremity edema, left leg wrapped with bandage.   Neuro: CN II-XII intact, strength 5/5 x 4, sensation, reflexes intact Skin: No ulcerative lesions noted or rashes Psychiatry: Mood is appropriate for condition and setting          Labs on Admission:  Basic Metabolic Panel: Recent  Labs  Lab 11/13/20 2321 11/14/20 0530  NA 141  --   K 2.7*  --   CL 106  --   CO2 24  --   GLUCOSE 118*  --   BUN 18  --   CREATININE 0.63  --   CALCIUM 8.5*  --   MG  --  1.7  PHOS  --  5.6*   Liver Function Tests: Recent Labs  Lab 11/13/20 2321  AST 21  ALT 16  ALKPHOS 104  BILITOT 0.7  PROT 7.2  ALBUMIN 2.7*   No results for input(s): LIPASE, AMYLASE in the last 168 hours. No results for input(s): AMMONIA in the last 168 hours. CBC: Recent Labs  Lab 11/13/20 2321  WBC 18.0*  NEUTROABS 14.5*  HGB 8.1*  HCT 26.2*  MCV 85.9  PLT 327   Cardiac Enzymes: No results for input(s): CKTOTAL, CKMB, CKMBINDEX, TROPONINI in the last 168 hours.  BNP (last 3 results) Recent Labs    04/19/20 0255 08/10/20 2352 11/13/20 2321  BNP 1,081.0* 2,259.0* 2,921.0*    ProBNP (last 3 results) No results for input(s): PROBNP in the last 8760 hours.  CBG: No results for input(s): GLUCAP in the last 168 hours.  Radiological Exams on Admission: DG Chest Portable 1 View  Result Date: 11/13/2020 CLINICAL DATA:  Shortness of breath EXAM: PORTABLE CHEST 1 VIEW COMPARISON:  08/10/2020, CT 08/11/2020 FINDINGS: Hyperinflation with emphysematous disease. Diffuse increased right greater than left interstitial and ground-glass opacity. There is mild cardiomegaly with central congestion. Aortic atherosclerosis. Peripheral lucency in the left thorax suspect due to artifact that extends external to the chest wall. IMPRESSION: 1. Emphysema 2. Cardiomegaly with central congestion. Diffuse increased right greater than left interstitial and ground-glass opacity, suspect acute superimposed edema or diffuse pneumonia on underlying chronic disease 3. Peripheral lucency in the left chest favored to be due to external artifact as opposed to pneumothorax. Electronically Signed   By: 08/13/2020 M.D.   On: 11/13/2020 23:55    EKG: I independently viewed the EKG done and my findings are as followed: Sinus  tachycardia at a rate of 108 bpm with prolonged QTc (506 ms) and paired PVCs  Assessment/Plan Present on Admission:  Acute on chronic diastolic congestive heart failure (HCC)  COPD (chronic obstructive pulmonary disease) (HCC)  Elevated troponin I level  Essential hypertension  Hypertensive emergency  Hypoalbuminemia  Elevated brain natriuretic peptide (BNP) level  Wound of left leg  Hypokalemia  Principal Problem:   Acute on chronic diastolic congestive heart failure (HCC) Active Problems:   COPD (chronic obstructive pulmonary disease) (HCC)   Essential hypertension   Hypertensive emergency   Hypokalemia   Hypoalbuminemia  Elevated brain natriuretic peptide (BNP) level   Elevated troponin I level   Wound of left leg   PAD (peripheral artery disease) (HCC)   Acute respiratory failure with hypoxia (HCC)   Leukocytosis   Prolonged QT interval   Acute respiratory failure with hypoxia secondary to acute on chronic diastolic CHF Patient was hypoxic on room air and required BiPAP obtain normal O2 sat Chest x-ray showed cardiomegaly with central congestion diffuse increased right greater than left  IV Lasix 80 mg was given in the ED Continue total input/output, daily weights and fluid restriction Continue IV Lasix 40 twice daily Continue Cardiac diet  Echocardiogram done on 08/12/2020 showed LVEF of 55 to 60% with LV diastolic parameters being consistent with grade 2 diastolic dysfunction.  Echocardiogram will be repeated  Elevated BNP BNP 2921 (this was 2259 about 3 months ago) Continue management as described above  Elevated troponin possibly secondary to type II demand ischemia r/o NSTEMI HS troponin-161 > 292 She denies chest pain, continue to trend troponin.  Therapeutic dose of Lovenox will be given at this time EKG shows sinus tachycardia at a rate of 108 bpm with prolonged QTc Cardiology consulted and we shall await further recommendation  Hypertensive  emergency-resolved Essential hypertension-uncontrolled Continue IV nitroglycerin drip and consider transitioning to p.o. once BP is better controlled Patient appears to have run out of several of her home BP meds last week. Continue home meds once patient is weaned off nitro drip  Leukocytosis possibly reactive WBC 18, continue to monitor WBC with morning labs  Prolonged QTc (506 ms) Avoid QT prolonging drugs Magnesium was 1.7; magnesium will be replenished to attain a goal of 2.0 K+ was 2.7, this was replenished  Hypokalemia K+ 2.7; this was replenished  Questionable pneumonia Chest x-ray was suggestive of suspicious pneumonia Patient was not coughing, was afebrile and chest x-ray showed central congestion Procalcitonin will be checked prior to considering antibiotics  Questionable PE Due to sudden onset of patient's tachycardia and tachypnea with shortness of breath requiring BiPAP VQ scan will be checked to rule out PE  History of atrial flutter Patient is currently in sinus tachycardia Continue home Cardizem and Coreg when patient is weaned off nitro drip Continue Lovenox and transition to home Eliquis when patient is weaned off BiPAP  COPD Continue Ventolin per home regimen  Wound of left leg possibly due to PAD Patient underwent exploration of left peroneal artery, harvest of left greater saphenous vein, amputation of left first toe on, 3/22 tolerated well. She underwent lower extremity angiography on 6/8 as a means to salvage the left limb prior to opting for potation Continue wound care  Tobacco abuse Patient will be counseled on tobacco abuse cessation when more stable   DVT prophylaxis: Lovenox  Code Status: Full code  Family Communication: None at bedside  Disposition Plan:  Patient is from:                        home Anticipated DC to:                   SNF or family members home Anticipated DC date:               2-3 days Anticipated DC barriers:           Patient is unstable to be discharged at this time due to acute respiratory failure with hypoxia requiring BiPAP in the setting of acute exacerbation of CHF  Consults called: Cardiology  Admission status: Inpatient    Frankey Shown MD [facility]  11/14/2020, 8:27 AM

## 2020-11-14 NOTE — Progress Notes (Signed)
ANTICOAGULATION CONSULT NOTE - Initial Consult  Pharmacy Consult for lovenox Indication: chest pain/ACS  No Known Allergies  Patient Measurements: Height: 5\' 2"  (157.5 cm) Weight: 45 kg (99 lb 3.3 oz) IBW/kg (Calculated) : 50.1 Heparin Dosing Weight:   Vital Signs: Temp: 98.1 F (36.7 C) (06/15 2308) Temp Source: Oral (06/15 2308) BP: 167/102 (06/16 0800) Pulse Rate: 95 (06/16 0800)  Labs: Recent Labs    11/13/20 2321 11/14/20 0128  HGB 8.1*  --   HCT 26.2*  --   PLT 327  --   LABPROT 15.2  --   INR 1.2  --   CREATININE 0.63  --   TROPONINIHS 161* 292*    Estimated Creatinine Clearance: 50.5 mL/min (by C-G formula based on SCr of 0.63 mg/dL).   Medical History: Past Medical History:  Diagnosis Date   GERD (gastroesophageal reflux disease)    Hypertension    Peripheral arterial disease (HCC)     Medications:  (Not in a hospital admission)   Assessment: Pharmacy consulted to dose lovenox in patient with chest pain/ACS (r/o NSTEMI)  Patient is on apixaban prior to admission with last dose not known at this time but patient admitted last night.  Hgb 8.1  Platelets WNL Trop 161 > 292  Goal of Therapy:   Monitor platelets by anticoagulation protocol: Yes   Plan: Lovenox 45 mg subq every 12 hours. Monitor labs and s/s of bleeding.  11/16/20, PharmD Clinical Pharmacist 11/14/2020 9:00 AM

## 2020-11-14 NOTE — ED Notes (Signed)
Nitroglycerin IVF being titrated, clarified order with EDP, goal SBP 140s-160s per EDP.

## 2020-11-14 NOTE — ED Notes (Signed)
Pt requesting pain medication, last dose was 1mg  dilaudid at 0222, Dr. made aware, orders placed for PO tylenol PRN. Made Dr aware pt is wearing bipap, no new orders.

## 2020-11-14 NOTE — Telephone Encounter (Signed)
Per Dr. Gilda Crease the hypoxia and fluid on the lung need to be fixed before  the toe.

## 2020-11-14 NOTE — ED Notes (Signed)
Pt wanting her phone which is in her wallet; security called and wallet given to pt; pt is requesting to keep wallet in room; wallet given to pt by security

## 2020-11-14 NOTE — Telephone Encounter (Signed)
Per Sarah Bonilla, she would like a call back from someone in the office that knows her mothers situation & what her next steps are to help set up appointment for surgery (information).  Daughter is aware hypoxia needs to be resolved first as Sarah Bonilla is working on her now.  Please advise.

## 2020-11-14 NOTE — ED Notes (Signed)
EDP at bedside, pt's daughter at bedside updated by EDP. Verbal order from Dr. Shela Commons. Mesner to titrate nitroglycerin infusion to 19mcg/min at this time.

## 2020-11-14 NOTE — Telephone Encounter (Signed)
Patient is currently admitted [facility] for hypoxia, fluid around lungs.  Patient is needing amputation of the toe. 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  Patients next appointment is scheduled for 6/22 to discuss the possibility amputation with Dr. Wyn Quaker.  Per patients daughter, patient had 2 stints and a balloon placed.  In order for amputation to take place, surgery will need to be scheduled in order for patient to be transferred.  Please advise and contact daughter Glee Arvin.

## 2020-11-14 NOTE — ED Provider Notes (Signed)
[facility] Provider Note   CSN: [id number] Arrival date & time: 11/13/20  2244     History Chief Complaint  Patient presents with   Shortness of Breath    Sarah Bonilla is a 65 y.o. female.   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/04/2020   Atrial flutter (HCC) 08/14/2020   Acute on chronic diastolic congestive heart failure (HCC)    Ulcer of great toe, left, with unspecified severity (HCC)    Dry gangrene (HCC)    Acute pulmonary edema (HCC)    Leg swelling    Acute CHF (congestive heart failure) (HCC) 04/19/2020   COPD (chronic obstructive pulmonary disease) (HCC) 04/19/2020   Essential hypertension 04/19/2020   Hypertensive emergency 04/19/2020   Hypokalemia 04/19/2020   Localized swelling of left lower extremity 04/19/2020   Hypoalbuminemia 04/19/2020   Elevated brain natriuretic peptide (BNP) level 04/19/2020   Elevated troponin I level 04/19/2020   Thrombocytopenia (HCC) 04/19/2020   Tobacco abuse 04/19/2020   Noncompliance with medication regimen 04/19/2020    Past Surgical History:  Procedure Laterality Date   ABDOMINAL AORTOGRAM W/LOWER EXTREMITY Bilateral 08/16/2020   Procedure: ABDOMINAL AORTOGRAM W/LOWER EXTREMITY;  Surgeon: Sherren KernsFields, Charles E, MD;  Location: [facility] INVASIVE CV LAB;  Service: Cardiovascular;  Laterality: Bilateral;   AMPUTATION Left 08/20/2020   Procedure: LEFT FIRST  TOE AMPUTATION;  Surgeon: Sherren KernsFields, Charles E, MD;  [facility];  Service: Vascular;  Laterality: Left;   BYPASS GRAFT FEMORAL-PERONEAL Left 08/20/2020   Procedure: exploration left peroneal artery ;  Surgeon: Sherren KernsFields, Charles E, MD;  [facility];  Service: Vascular;  Laterality: Left;   CHOLECYSTECTOMY     LOWER EXTREMITY ANGIOGRAPHY N/A 11/06/2020   Procedure: LOWER EXTREMITY ANGIOGRAPHY;  Surgeon: Annice Needyew, Susan Arana S, MD;  Location: [facility] INVASIVE CV LAB;  Service: Cardiovascular;  Laterality: N/A;   VEIN HARVEST Left 08/20/2020   Procedure: VEIN HARVEST LEFT GREATER SAPHENOUS VEIN;  Surgeon: Sherren KernsFields, Charles E, MD;  [facility];  Service: Vascular;  Laterality: Left;     OB History   No obstetric history on file.     Family History  Problem Relation Age of Onset   CVA Mother    Hypertension Father     Social History   Tobacco Use   Smoking status: Every Day    Packs/day: 1.00    Pack years: 0.00    Types: Cigarettes   Smokeless tobacco: Never  Vaping Use   Vaping Use: Some days  Substance Use Topics   Alcohol use: Yes    Comment: occasionally    Drug use: Never    Home Medications Prior to Admission medications   Medication Sig Start Date End Date Taking? Authorizing Provider  acetaminophen (TYLENOL) 325 MG tablet Take 650 mg by mouth every 6 (six) hours as needed for moderate pain or headache.    [provider]  albuterol (VENTOLIN HFA)  108 (90 Base) MCG/ACT inhaler Inhale 1 puff into the lungs 4 (four) times daily as needed for wheezing or shortness of breath. 10/09/20   [provider]  apixaban (ELIQUIS) 5 MG TABS tablet TAKE 1 TABLET (5 MG TOTAL) BY MOUTH TWO TIMES DAILY. 08/21/20 08/21/21  Cipriano Bunker, MD  aspirin EC 81 MG tablet Take 1 tablet (81 mg total) by mouth daily. 11/06/20   Annice Needy, MD  atorvastatin (LIPITOR) 10 MG tablet Take 1 tablet (10 mg total) by mouth daily. 11/06/20 11/06/21  Annice Needy, MD  carvedilol (COREG) 25 MG tablet Take  1 tablet (25 mg total) by mouth 2 (two) times daily with a meal. 04/24/20   Vassie Loll, MD  diltiazem (CARDIZEM CD) 360 MG 24 hr capsule Take 1 capsule (360 mg total) by mouth daily. 08/22/20   Cipriano Bunker, MD  enoxaparin (LOVENOX) 40 MG/0.4ML injection Inject 0.4 mLs (40 mg total) into the skin every 12 (twelve) hours for 3 days. 10/25/20 10/28/20  Sherren Kerns, MD  furosemide (LASIX) 40 MG tablet Take 40 mg by mouth daily. 09/05/20   [provider]  hydrALAZINE (APRESOLINE) 100 MG tablet Take 1 tablet (100 mg total) by mouth every 8 (eight) hours. 08/21/20   Cipriano Bunker, MD  HYDROcodone-acetaminophen (NORCO/VICODIN) 5-325 MG tablet Take 1 tablet by mouth every 6 (six) hours as needed for severe pain. 11/04/20   Jene Every, MD  ibuprofen (ADVIL) 200 MG tablet Take 400 mg by mouth every 6 (six) hours as needed for mild pain.    [provider]  isosorbide mononitrate (IMDUR) 60 MG 24 hr tablet Take 1 tablet (60 mg total) by mouth daily. 08/22/20   Cipriano Bunker, MD  lisinopril (ZESTRIL) 40 MG tablet Take 1 tablet (40 mg total) by mouth daily. 04/25/20   Vassie Loll, MD  losartan (COZAAR) 50 MG tablet Take 50 mg by mouth daily. Patient not taking: Reported on 11/06/2020 09/05/20   [provider]  pantoprazole (PROTONIX) 40 MG tablet Take 1 tablet (40 mg total) by mouth daily. Patient not taking: Reported on 11/04/2020 04/25/20   Vassie Loll, MD  potassium chloride (KLOR-CON) 10 MEQ tablet Take 10 mEq by mouth daily. 09/05/20   [provider]  potassium chloride SA (KLOR-CON) 20 MEQ tablet Take 1 tablet (20 mEq total) by mouth 2 (two) times daily for 10 days. 11/04/20 11/14/20  Jene Every, MD    Allergies    Patient has no known allergies.  Review of Systems   Review of Systems  Respiratory:  Positive for shortness of breath.   All other systems reviewed and are negative.  Physical Exam Updated Vital Signs BP (!) 168/103   Pulse (!) 102    Temp 98.1 F (36.7 C) (Oral)   Resp 13   Ht 5\' 2"  (1.575 m)   Wt 45 kg   SpO2 100%   BMI 18.15 kg/m   Physical Exam Vitals and nursing note reviewed.  Constitutional:      Appearance: She is well-developed.  HENT:     Head: Normocephalic and atraumatic.  Cardiovascular:     Rate and Rhythm: Normal rate and regular rhythm.  Pulmonary:     Effort: Tachypnea, accessory muscle usage and respiratory distress present.     Breath sounds: No stridor. Decreased breath sounds, wheezing, rhonchi and rales present.  Chest:     Chest wall: No tenderness.  Abdominal:     General: There is no distension.  Musculoskeletal:  Cervical back: Normal range of motion.     Right lower leg: No tenderness. Edema (mild) present.     Left lower leg: Tenderness present. Edema present.  Skin:    General: Skin is warm and dry.     Coloration: Skin is not cyanotic or pale.  Neurological:     General: No focal deficit present.     Mental Status: She is alert.    ED Results / Procedures / Treatments   Labs (all labs ordered are listed, but only abnormal results are displayed) Labs Reviewed  CBC WITH DIFFERENTIAL/PLATELET - Abnormal; Notable for the following components:      Result Value   WBC 18.0 (*)    RBC 3.05 (*)    Hemoglobin 8.1 (*)    HCT 26.2 (*)    RDW 18.8 (*)    Neutro Abs 14.5 (*)    Abs Immature Granulocytes 0.12 (*)    All other components within normal limits  COMPREHENSIVE METABOLIC PANEL - Abnormal; Notable for the following components:   Potassium 2.7 (*)    Glucose, Bld 118 (*)    Calcium 8.5 (*)    Albumin 2.7 (*)    All other components within normal limits  BRAIN NATRIURETIC PEPTIDE - Abnormal; Notable for the following components:   B Natriuretic Peptide 2,921.0 (*)    All other components within normal limits  TROPONIN I (HIGH SENSITIVITY) - Abnormal; Notable for the following components:   Troponin I (High Sensitivity) 161 (*)    All other components within  normal limits  TROPONIN I (HIGH SENSITIVITY) - Abnormal; Notable for the following components:   Troponin I (High Sensitivity) 292 (*)    All other components within normal limits  RESP PANEL BY RT-PCR (FLU A&B, COVID) [id number]  PROTIME-INR    EKG None  Radiology DG Chest Portable 1 View  Result Date: 11/13/2020 CLINICAL DATA:  Shortness of breath EXAM: PORTABLE CHEST 1 VIEW COMPARISON:  08/10/2020, CT 08/11/2020 FINDINGS: Hyperinflation with emphysematous disease. Diffuse increased right greater than left interstitial and ground-glass opacity. There is mild cardiomegaly with central congestion. Aortic atherosclerosis. Peripheral lucency in the left thorax suspect due to artifact that extends external to the chest wall. IMPRESSION: 1. Emphysema 2. Cardiomegaly with central congestion. Diffuse increased right greater than left interstitial and ground-glass opacity, suspect acute superimposed edema or diffuse pneumonia on underlying chronic disease 3. Peripheral lucency in the left chest favored to be due to external artifact as opposed to pneumothorax. Electronically Signed   By: Jasmine Pang M.D.   On: 11/13/2020 23:55    Procedures .Critical Care  Date/Time: 11/14/2020 2:46 AM Performed by: Marily Memos, MD Authorized by: Marily Memos, MD   Critical care provider statement:    Critical care time (minutes):  75   Critical care was necessary to treat or prevent imminent or life-threatening deterioration of the following conditions:  Respiratory failure, metabolic crisis, endocrine crisis, cardiac failure and circulatory failure   Critical care was time spent personally by me on the following activities:  Discussions with consultants, evaluation of patient's response to treatment, examination of patient, ordering and performing treatments and interventions, ordering and review of laboratory studies, ordering and review of radiographic studies, pulse oximetry, re-evaluation of patient's  condition, obtaining history from patient or surrogate and review of old charts   Medications Ordered in ED Medications  nitroGLYCERIN 50 mg in dextrose 5 % 250 mL (0.2 mg/mL) infusion (110 mcg/min Intravenous Rate/Dose Change 11/14/20 0253)  potassium  chloride SA (KLOR-CON) CR tablet 80 mEq (80 mEq Oral Not Given 11/14/20 0102)  potassium chloride 10 mEq in 100 mL IVPB (10 mEq Intravenous New Bag/Given 11/14/20 0205)  furosemide (LASIX) injection 80 mg (80 mg Intravenous Given 11/14/20 0101)  HYDROmorphone (DILAUDID) injection 1 mg (1 mg Intravenous Given 11/14/20 0222)    ED Course  I have reviewed the triage vital signs and the nursing notes.  Pertinent labs & imaging results that were available during my care of the patient were reviewed by me and considered in my medical decision making (see chart for details).    MDM Rules/Calculators/A&P                          65 year old female with history of CHF, COPD recent popliteal stents placed the presents emerged from today for shortness of breath.  Patient states that over the last 1012 hrs. of progressively worsening dyspnea.  She called EMS she was having some percent on room air with them stated put her on a nonrebreather and brought her here for further evaluation.  No breathing treatments prior to arrival.  No fevers at home no coughing at home and specifically no productive cough.  Patient got an x-ray here which appeared to be consistent with pulmonary edema so Lasix was given.  She is started on potassium orally and IV at the same time secondary to low potassium.  She was also started nitroglycerin drip to help with blood pressure control, afterload reduction to try to help with the pulmonary edema.  BiPAP at the same time.  She is tolerated that well. In reference to her leg it does appear that she has a vascular procedure done about a week ago.  She has had stents placed in her left leg.  Left leg is edematous.  There is report of maggots  however the wound has been cleaned by the daughter recently.  I think she would benefit from some wound care when she is in the hospital.  There is plans for possible amputation however that is not scheduled until after the 22nd.    I will discuss with hospitalist for admission here for respiratory failure and treatment for the same.  Also would likely benefit from wound care while she is here.  Final Clinical Impression(s) / ED Diagnoses Final diagnoses:  Hypoxia  Hypokalemia  Acute respiratory failure with hypoxia [facility])    Rx / DC Orders ED Discharge Orders     None        Vadie Principato, Barbara Cower, MD 11/14/20 [phone]

## 2020-11-14 NOTE — Progress Notes (Addendum)
Progress Note    Sarah Bonilla  EHU:[id number] DOB: Dec 27, 1955  DOA: 11/13/2020 PCP: Avon Gully, MD      Brief Narrative:    Medical records reviewed and are as summarized below:  Sarah Bonilla is a 65 y.o. female with medical history significant for chronic diastolic CHF, COPD, hypertension, PVD, GERD, tobacco use disorder, medical nonadherence.  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 She was admitted from 08/10/2020 to 08/21/2020 due to left great toe pain secondary to cellulitis/dry gangrene in the setting of moderate PAD at which time she presented with hypertensive emergency which was treated with Cardene drip. She was also admitted from 11/19 - 04/24/2020 due to acute respiratory failure with hypoxia secondary to diastolic CHF and hypertensive emergency.    She presented to the hospital because of worsening shortness of breath.  Reportedly when EMS arrived, oxygen saturation was 77% on room air.  She was found to have acute on chronic diastolic CHF, hypertensive emergency and acute hypoxic respiratory failure.  She was treated with IV Lasix, IV nitroglycerin infusion and BiPAP.  She was weaned off of BiPAP and transitioned to oxygen via nasal cannula.      Assessment/Plan:   Principal Problem:   Acute on chronic diastolic congestive heart failure (HCC) Active Problems:   COPD (chronic obstructive pulmonary disease) (HCC)   Essential hypertension   Hypertensive emergency   Hypokalemia   Hypoalbuminemia   Elevated brain natriuretic peptide (BNP) level   Elevated troponin I level   Wound of left leg   PAD (peripheral artery disease) (HCC)   Acute respiratory failure with hypoxia (HCC)   Leukocytosis   Prolonged QT interval    Body mass index is 18.15 kg/m.   Acute on chronic diastolic CHF: BNP was 2,921. Continue IV Lasix.  2D echo in March 2022 showed EF of 55 to 60%, grade  2 diastolic dysfunction, moderately elevated pulmonary artery systolic pressure, moderate to severe MR, moderate TR, mild AR.  Repeat limited 2D echo is pending.  Hypertensive emergency: Continue IV nitroglycerin infusion and taper off as able.  Resume home antihypertensives (carvedilol, Cardizem, lisinopril, hydralazine and isosorbide mononitrate).  Acute hypoxic respiratory failure: She has been weaned off of BiPAP.  Continue 3 L/min oxygen via nasal cannula and taper off as able.  Elevated troponins: Troponin went from 161-991.  This is thought to be from demand ischemia.  Appreciate input from cardiologist.  VQ scan was negative for PE.  COPD: Stable.  Continue bronchodilators.  Paroxysmal atrial flutter: Continue therapeutic dose Lovenox for now.  Eliquis is on hold.  Hypokalemia: Continue potassium and magnesium repletion.  Prolonged QTc interval: Repeat EKG tomorrow.  Wound of left leg possibly due to PAD: Patient underwent exploration of left peroneal artery, harvest of left greater saphenous vein, amputation of left first toe on, 3/22 tolerated well. She underwent lower extremity angiography on 6/8 as a means to salvage the left limb prior to opting for potation.  Continue aspirin and Lipitor  Tobacco use disorder: Counseled to quit.  Medical nonadherence: Patient was counseled on the importance of medical adherence.         Diet Order             Diet heart healthy/carb modified Room service appropriate? Yes; Fluid consistency: Thin  Diet effective now  Consultants: Cardiologist  Procedures: None    Medications:    aspirin EC  81 mg Oral Daily   atorvastatin  10 mg Oral Daily   carvedilol  25 mg Oral BID WC   diltiazem  360 mg Oral Daily   enoxaparin (LOVENOX) injection  45 mg Subcutaneous Q12H   furosemide  40 mg Intravenous Q12H   hydrALAZINE  100 mg Oral Q8H   isosorbide mononitrate  60 mg Oral Daily   lisinopril  40 mg  Oral Daily   potassium chloride SA  80 mEq Oral Once   Continuous Infusions:  nitroGLYCERIN 115 mcg/min (11/14/20 0533)     Anti-infectives (From admission, onward)    None              Family Communication/Anticipated D/C date and plan/Code Status   DVT prophylaxis: SCDs Start: 11/14/20 0412     Code Status: Full Code  Family Communication: None Disposition Plan:    Status is: Inpatient  Remains inpatient appropriate because:IV treatments appropriate due to intensity of illness or inability to take PO  Dispo: The patient is from: Home              Anticipated d/c is to: Home              Patient currently is not medically stable to d/c.   Difficult to place patient No           Subjective:   Interval events noted.  Breathing is a little better.  She is off of BiPAP and she is on oxygen via nasal cannula.  Objective:    Vitals:   11/14/20 1430 11/14/20 1500 11/14/20 1530 11/14/20 1545  BP: (!) 179/89 (!) 176/83 (!) 163/85 (!) 146/108  Pulse: 96 96 96 (!) 103  Resp: 18 (!) 23 (!) 24 17  Temp:      TempSrc:      SpO2: (!) 88% 91% 94%   Weight:      Height:       No data found.  No intake or output data in the 24 hours ending 11/14/20 1601 Filed Weights   11/13/20 2256  Weight: 45 kg    Exam:  GEN: NAD SKIN: Warm and dry EYES: EOMI ENT: MMM CV: RRR PULM: Decreased air entry bilaterally.  No wheezing or rales heard. ABD: soft, ND, NT, +BS CNS: AAO x 3, non focal EXT: No edema or tenderness        Data Reviewed:   I have personally reviewed following labs and imaging studies:  Labs: Labs show the following:   Basic Metabolic Panel: Recent Labs  Lab 11/13/20 2321 11/14/20 0530  NA 141 141  K 2.7* 3.6  CL 106 103  CO2 24 27  GLUCOSE 118* 98  BUN 18 19  CREATININE 0.63 0.60  CALCIUM 8.5* 8.5*  MG  --  1.7  PHOS  --  5.6*   GFR Estimated Creatinine Clearance: 50.5 mL/min (by C-G formula based on SCr of 0.6  mg/dL). Liver Function Tests: Recent Labs  Lab 11/13/20 2321  AST 21  ALT 16  ALKPHOS 104  BILITOT 0.7  PROT 7.2  ALBUMIN 2.7*   No results for input(s): LIPASE, AMYLASE in the last 168 hours. No results for input(s): AMMONIA in the last 168 hours. Coagulation profile Recent Labs  Lab 11/13/20 2321  INR 1.2    CBC: Recent Labs  Lab 11/13/20 2321  WBC 18.0*  NEUTROABS 14.5*  HGB 8.1*  HCT 26.2*  MCV 85.9  PLT 327   Cardiac Enzymes: No results for input(s): CKTOTAL, CKMB, CKMBINDEX, TROPONINI in the last 168 hours. BNP (last 3 results) No results for input(s): PROBNP in the last 8760 hours. CBG: No results for input(s): GLUCAP in the last 168 hours. D-Dimer: No results for input(s): DDIMER in the last 72 hours. Hgb A1c: No results for input(s): HGBA1C in the last 72 hours. Lipid Profile: No results for input(s): CHOL, HDL, LDLCALC, TRIG, CHOLHDL, LDLDIRECT in the last 72 hours. Thyroid function studies: No results for input(s): TSH, T4TOTAL, T3FREE, THYROIDAB in the last 72 hours.  Invalid input(s): FREET3 Anemia work up: No results for input(s): VITAMINB12, FOLATE, FERRITIN, TIBC, IRON, RETICCTPCT in the last 72 hours. Sepsis Labs: Recent Labs  Lab 11/13/20 2321 11/14/20 0834  PROCALCITON  --  2.19  WBC 18.0*  --     Microbiology Recent Results (from the past 240 hour(s))  SARS CORONAVIRUS 2 (TAT 6-24 HRS) Nasopharyngeal Nasopharyngeal Swab     Status: None   Collection Time: 11/04/20  4:16 PM   Specimen: Nasopharyngeal Swab  Result Value Ref Range Status   SARS Coronavirus 2 NEGATIVE NEGATIVE Final    Comment: (NOTE) SARS-CoV-2 target nucleic acids are NOT DETECTED.  The SARS-CoV-2 RNA is generally detectable in upper and lower respiratory specimens during the acute phase of infection. Negative results do not preclude SARS-CoV-2 infection, do not rule out co-infections with other pathogens, and should not be used as the sole basis for treatment  or other patient management decisions. Negative results must be combined with clinical observations, patient history, and epidemiological information. The expected result is Negative.  Fact Sheet for Patients: HairSlick.nohttps://www.fda.gov/media/138098/download  Fact Sheet for Healthcare Providers: quierodirigir.comhttps://www.fda.gov/media/138095/download  This test is not yet approved or cleared by the [place] FDA and  has been authorized for detection and/or diagnosis of SARS-CoV-2 by FDA under an Emergency Use Authorization (EUA). This EUA will remain  in effect (meaning this test can be used) for the duration of the COVID-19 declaration under Se ction 564(b)(1) of the Act, 21 U.S.C. section 360bbb-3(b)(1), unless the authorization is terminated or revoked sooner.  Performed at [facility], [address] [place], [place] [address]   Resp Panel by RT-PCR (Flu A&B, Covid) Nasopharyngeal Swab     Status: None   Collection Time: 11/14/20 12:40 AM   Specimen: Nasopharyngeal Swab; Nasopharyngeal(NP) swabs in vial transport medium  Result Value Ref Range Status   SARS Coronavirus 2 by RT PCR NEGATIVE NEGATIVE Final    Comment: (NOTE) SARS-CoV-2 target nucleic acids are NOT DETECTED.  The SARS-CoV-2 RNA is generally detectable in upper respiratory specimens during the acute phase of infection. The lowest concentration of SARS-CoV-2 viral copies this assay can detect is 138 copies/mL. A negative result does not preclude SARS-Cov-2 infection and should not be used as the sole basis for treatment or other patient management decisions. A negative result may occur with  improper specimen collection/handling, submission of specimen other than nasopharyngeal swab, presence of viral mutation(s) within the areas targeted by this assay, and inadequate number of viral copies(<138 copies/mL). A negative result must be combined with clinical observations, patient history, and epidemiological information.  The expected result is Negative.  Fact Sheet for Patients:  BloggerCourse.comhttps://www.fda.gov/media/152166/download  Fact Sheet for Healthcare Providers:  SeriousBroker.ithttps://www.fda.gov/media/152162/download  This test is no t yet approved or cleared by the [place] FDA and  has been authorized for detection and/or diagnosis of SARS-CoV-2 by  FDA under an Emergency Use Authorization (EUA). This EUA will remain  in effect (meaning this test can be used) for the duration of the COVID-19 declaration under Section 564(b)(1) of the Act, 21 U.S.C.section 360bbb-3(b)(1), unless the authorization is terminated  or revoked sooner.       Influenza A by PCR NEGATIVE NEGATIVE Final   Influenza B by PCR NEGATIVE NEGATIVE Final    Comment: (NOTE) The Xpert Xpress SARS-CoV-2/FLU/RSV plus assay is intended as an aid in the diagnosis of influenza from Nasopharyngeal swab specimens and should not be used as a sole basis for treatment. Nasal washings and aspirates are unacceptable for Xpert Xpress SARS-CoV-2/FLU/RSV testing.  Fact Sheet for Patients: BloggerCourse.com  Fact Sheet for Healthcare Providers: SeriousBroker.it  This test is not yet approved or cleared by the [place] FDA and has been authorized for detection and/or diagnosis of SARS-CoV-2 by FDA under an Emergency Use Authorization (EUA). This EUA will remain in effect (meaning this test can be used) for the duration of the COVID-19 declaration under Section 564(b)(1) of the Act, 21 U.S.C. section 360bbb-3(b)(1), unless the authorization is terminated or revoked.  Performed at [facility], [address] [place], [place] [address]     Procedures and diagnostic studies:  NM Pulmonary Perfusion  Result Date: 11/14/2020 CLINICAL DATA:  Onset shortness of breath yesterday. EXAM: NUCLEAR MEDICINE PERFUSION LUNG SCAN TECHNIQUE: Perfusion images were obtained in multiple projections after  intravenous injection of radiopharmaceutical. Ventilation scans intentionally deferred if perfusion scan and chest x-ray adequate for interpretation during COVID 19 epidemic. RADIOPHARMACEUTICALS:  4.4 mCi Tc-32m MAA IV COMPARISON:  Single-view of the chest 11/13/2020. FINDINGS: No segmental or subsegmental defect is identified. IMPRESSION: Negative for pulmonary embolus. Electronically Signed   By: Drusilla Kanner M.D.   On: 11/14/2020 11:29   DG Chest Portable 1 View  Result Date: 11/13/2020 CLINICAL DATA:  Shortness of breath EXAM: PORTABLE CHEST 1 VIEW COMPARISON:  08/10/2020, CT 08/11/2020 FINDINGS: Hyperinflation with emphysematous disease. Diffuse increased right greater than left interstitial and ground-glass opacity. There is mild cardiomegaly with central congestion. Aortic atherosclerosis. Peripheral lucency in the left thorax suspect due to artifact that extends external to the chest wall. IMPRESSION: 1. Emphysema 2. Cardiomegaly with central congestion. Diffuse increased right greater than left interstitial and ground-glass opacity, suspect acute superimposed edema or diffuse pneumonia on underlying chronic disease 3. Peripheral lucency in the left chest favored to be due to external artifact as opposed to pneumothorax. Electronically Signed   By: Jasmine Pang M.D.   On: 11/13/2020 23:55               LOS: 0 days   Lealand Elting  [facility]   Pager on [email or web address]. If 7PM-7AM, please contact night-coverage at [email or web address]     11/14/2020, 4:01 PM

## 2020-11-14 NOTE — ED Notes (Signed)
Pt had incontinence of urine; pt cleaned and bed linens changed; son at bedside

## 2020-11-14 NOTE — ED Notes (Signed)
Pt wallet with money given to security at this time

## 2020-11-14 NOTE — ED Notes (Signed)
Date and time results received: 11/14/20 0230  Test: troponin Critical Value: 292  Name of Provider Notified: Dr. Carolynn Serve  Orders Received? Or Actions Taken?: no new orders at this time

## 2020-11-14 NOTE — ED Notes (Signed)
Critical values  Potassium 2.7 Troponin 161  Reported to edp mesner---- see orders

## 2020-11-14 NOTE — ED Notes (Signed)
Upon entering room pt had taken oxygen and O2 probe off. Pt was in no distress, O2 sats were 89% on room air. Pt placed back on O2 and monitor-instructed to leave in place. Pt verbalized understanding. Pt ate 100% of dinner tray.

## 2020-11-15 ENCOUNTER — Inpatient Hospital Stay (HOSPITAL_COMMUNITY): Payer: Medicaid Other

## 2020-11-15 ENCOUNTER — Other Ambulatory Visit (HOSPITAL_COMMUNITY): Payer: Self-pay | Admitting: *Deleted

## 2020-11-15 DIAGNOSIS — I5031 Acute diastolic (congestive) heart failure: Secondary | ICD-10-CM

## 2020-11-15 LAB — PREPARE RBC (CROSSMATCH)

## 2020-11-15 LAB — CBC
HCT: 23 % — ABNORMAL LOW (ref 36.0–46.0)
Hemoglobin: 7 g/dL — ABNORMAL LOW (ref 12.0–15.0)
MCH: 25.8 pg — ABNORMAL LOW (ref 26.0–34.0)
MCHC: 30.4 g/dL (ref 30.0–36.0)
MCV: 84.9 fL (ref 80.0–100.0)
Platelets: 257 10*3/uL (ref 150–400)
RBC: 2.71 MIL/uL — ABNORMAL LOW (ref 3.87–5.11)
RDW: 18.8 % — ABNORMAL HIGH (ref 11.5–15.5)
WBC: 10 10*3/uL (ref 4.0–10.5)
nRBC: 0 % (ref 0.0–0.2)

## 2020-11-15 LAB — COMPREHENSIVE METABOLIC PANEL
ALT: 13 U/L (ref 0–44)
AST: 15 U/L (ref 15–41)
Albumin: 2.3 g/dL — ABNORMAL LOW (ref 3.5–5.0)
Alkaline Phosphatase: 79 U/L (ref 38–126)
Anion gap: 8 (ref 5–15)
BUN: 19 mg/dL (ref 8–23)
CO2: 31 mmol/L (ref 22–32)
Calcium: 8.2 mg/dL — ABNORMAL LOW (ref 8.9–10.3)
Chloride: 103 mmol/L (ref 98–111)
Creatinine, Ser: 0.78 mg/dL (ref 0.44–1.00)
GFR, Estimated: >60 mL/min (ref >60)
Glucose, Bld: 111 mg/dL — ABNORMAL HIGH (ref 70–99)
Potassium: 3 mmol/L — ABNORMAL LOW (ref 3.5–5.1)
Sodium: 142 mmol/L (ref 135–145)
Total Bilirubin: 0.4 mg/dL (ref 0.3–1.2)
Total Protein: 6.2 g/dL — ABNORMAL LOW (ref 6.5–8.1)

## 2020-11-15 LAB — ECHOCARDIOGRAM COMPLETE
AR max vel: 1.98 cm2
AV Area VTI: 1.99 cm2
AV Area mean vel: 1.87 cm2
AV Mean grad: 6.3 mmHg
AV Peak grad: 13 mmHg
Ao pk vel: 1.8 m/s
Area-P 1/2: 3.03 cm2
Height: 62 in
MV M vel: 4.63 m/s
MV Peak grad: 85.7 mmHg
Radius: 0.4 cm
S' Lateral: 2.6 cm
Weight: 1612 oz

## 2020-11-15 LAB — HEMOGLOBIN AND HEMATOCRIT, BLOOD
HCT: 30.3 % — ABNORMAL LOW (ref 36.0–46.0)
Hemoglobin: 9.4 g/dL — ABNORMAL LOW (ref 12.0–15.0)

## 2020-11-15 LAB — APTT: aPTT: 44 seconds — ABNORMAL HIGH (ref 24–36)

## 2020-11-15 LAB — MRSA NEXT GEN BY PCR, NASAL: MRSA by PCR Next Gen: NOT DETECTED

## 2020-11-15 LAB — MAGNESIUM: Magnesium: 2 mg/dL (ref 1.7–2.4)

## 2020-11-15 LAB — PROTIME-INR
INR: 1.3 — ABNORMAL HIGH (ref 0.8–1.2)
Prothrombin Time: 16.1 seconds — ABNORMAL HIGH (ref 11.4–15.2)

## 2020-11-15 MED ORDER — POTASSIUM CHLORIDE CRYS ER 20 MEQ PO TBCR
40.0000 meq | EXTENDED_RELEASE_TABLET | ORAL | Status: AC
  Administered 2020-11-15 (×2): 40 meq via ORAL
  Filled 2020-11-15 (×2): qty 2

## 2020-11-15 MED ORDER — IBUPROFEN 400 MG PO TABS
400.0000 mg | ORAL_TABLET | Freq: Once | ORAL | Status: AC
  Administered 2020-11-15: 400 mg via ORAL
  Filled 2020-11-15: qty 1

## 2020-11-15 MED ORDER — FUROSEMIDE 10 MG/ML IJ SOLN
20.0000 mg | Freq: Two times a day (BID) | INTRAMUSCULAR | Status: DC
  Administered 2020-11-15 – 2020-11-16 (×2): 20 mg via INTRAVENOUS
  Filled 2020-11-15 (×2): qty 2

## 2020-11-15 MED ORDER — SODIUM CHLORIDE 0.9% IV SOLUTION: Freq: Once | INTRAVENOUS | Status: AC

## 2020-11-15 NOTE — Telephone Encounter (Signed)
Unfortunately Dr. Wyn Quaker is in clinic and surgeries and he cannot call directly.  He notes that she does need surgery for her toe however she needs to be stable before we can do any surgery.  Therefore, she needs to complete her treatment while at [facility] and then once she is stable we can plan for surgery.  Dr. Wyn Quaker notes that if amputation of her toe is something needs to done urgently, the amputation can be done by one of the surgeons that practice there.  Unfortunately he does not have privileges to practice there (at [facility]) and currently transfer to [facility] is not an option.  So again, her respiratory status is the most important thing but once she is stable we can discuss amputation

## 2020-11-15 NOTE — Progress Notes (Signed)
EKG done and given to nurse 

## 2020-11-15 NOTE — Telephone Encounter (Signed)
Spoke with the patient's daughter and gave her the information from Dr. Wyn Quaker and Sheppard Plumber NP. The daughter understood she wanted to let us know that Sarah Bonilla kept telling her to call our office to have the patient sent to [facility] after being discharged from them. The daughter also stated that the hospital was afraid she would go down hill if she went home without being scheduled for a amputation. The patient's daughter is upset that they will not go ahead and do the amputation while she is in the hospital now if she is in such a bad shape. Patient has an ultrasound and f/u appt in our office on 11/20/20 to be seen. The daughter does not want the appt canceled.

## 2020-11-15 NOTE — Progress Notes (Signed)
Progress Note  Patient Name: Sarah Bonilla Date of Encounter: 11/15/2020  [facility] Cardiologist: Dina Rich, MD  Subjective   No complaints  Inpatient Medications    Scheduled Meds:  aspirin EC  81 mg Oral Daily   atorvastatin  10 mg Oral Daily   carvedilol  25 mg Oral BID WC   Chlorhexidine Gluconate Cloth  6 each Topical Daily   diltiazem  360 mg Oral Daily   enoxaparin (LOVENOX) injection  45 mg Subcutaneous Q12H   furosemide  40 mg Intravenous Q12H   hydrALAZINE  100 mg Oral Q8H   lisinopril  40 mg Oral Daily   potassium chloride  40 mEq Oral Q4H   Continuous Infusions:  nitroGLYCERIN Stopped (11/15/20 0530)   PRN Meds: acetaminophen, albuterol, HYDROcodone-acetaminophen   Vital Signs    Vitals:   11/15/20 0615 11/15/20 0630 11/15/20 0700 11/15/20 0725  BP: (!) 129/51 (!) 150/52 (!) 160/72   Pulse: 71 71 73 73  Resp: (!) 36 (!) 27 (!) 23 (!) 21  Temp:    99.8 F (37.7 C)  TempSrc:    Oral  SpO2: 100% 97% 100% 99%  Weight:      Height:        Intake/Output Summary (Last 24 hours) at 11/15/2020 0817 Last data filed at 11/15/2020 0534 Gross per 24 hour  Intake 747.1 ml  Output 400 ml  Net 347.1 ml   Last 3 Weights 11/15/2020 11/14/2020 11/13/2020  Weight (lbs) 100 lb 12 oz 100 lb 15.5 oz 99 lb 3.3 oz  Weight (kg) 45.7 kg 45.8 kg 45 kg      Telemetry    SR, short run of SVT- Personally Reviewed  ECG    SR,anterior TWIs- Personally Reviewed  Physical Exam   GEN: No acute distress.   Neck: elevated JVD Cardiac: crackles bilaterla bases Respiratory: Clear to auscultation bilaterally. GI: Soft, nontender, non-distended  MS: No edema; No deformity. Neuro:  Nonfocal  Psych: Normal affect   Labs    High Sensitivity Troponin:   Recent Labs  Lab 11/13/20 2321 11/14/20 0128 11/14/20 0834 11/14/20 1013  TROPONINIHS 161* 292* 991* 628*      Chemistry Recent Labs  Lab 11/13/20 2321 11/14/20 0530 11/15/20 0524  NA 141 141  142  K 2.7* 3.6 3.0*  CL 106 103 103  CO2 24 27 31   GLUCOSE 118* 98 111*  BUN 18 19 19   CREATININE 0.63 0.60 0.78  CALCIUM 8.5* 8.5* 8.2*  PROT 7.2  --  6.2*  ALBUMIN 2.7*  --  2.3*  AST 21  --  15  ALT 16  --  13  ALKPHOS 104  --  79  BILITOT 0.7  --  0.4  GFRNONAA >60 >60 >60  ANIONGAP 11 11 8      Hematology Recent Labs  Lab 11/13/20 2321 11/15/20 0524  WBC 18.0* 10.0  RBC 3.05* 2.71*  HGB 8.1* 7.0*  HCT 26.2* 23.0*  MCV 85.9 84.9  MCH 26.6 25.8*  MCHC 30.9 30.4  RDW 18.8* 18.8*  PLT 327 257    BNP Recent Labs  Lab 11/13/20 2321  BNP 2,921.0*     DDimer No results for input(s): DDIMER in the last 168 hours.   Radiology    NM Pulmonary Perfusion  Result Date: 11/14/2020 CLINICAL DATA:  Onset shortness of breath yesterday. EXAM: NUCLEAR MEDICINE PERFUSION LUNG SCAN TECHNIQUE: Perfusion images were obtained in multiple projections after intravenous injection of radiopharmaceutical. Ventilation scans intentionally  deferred if perfusion scan and chest x-ray adequate for interpretation during COVID 19 epidemic. RADIOPHARMACEUTICALS:  4.4 mCi Tc-49m MAA IV COMPARISON:  Single-view of the chest 11/13/2020. FINDINGS: No segmental or subsegmental defect is identified. IMPRESSION: Negative for pulmonary embolus. Electronically Signed   By: Drusilla Kanner M.D.   On: 11/14/2020 11:29   DG Chest Portable 1 View  Result Date: 11/13/2020 CLINICAL DATA:  Shortness of breath EXAM: PORTABLE CHEST 1 VIEW COMPARISON:  08/10/2020, CT 08/11/2020 FINDINGS: Hyperinflation with emphysematous disease. Diffuse increased right greater than left interstitial and ground-glass opacity. There is mild cardiomegaly with central congestion. Aortic atherosclerosis. Peripheral lucency in the left thorax suspect due to artifact that extends external to the chest wall. IMPRESSION: 1. Emphysema 2. Cardiomegaly with central congestion. Diffuse increased right greater than left interstitial and  ground-glass opacity, suspect acute superimposed edema or diffuse pneumonia on underlying chronic disease 3. Peripheral lucency in the left chest favored to be due to external artifact as opposed to pneumothorax. Electronically Signed   By: Jasmine Pang M.D.   On: 11/13/2020 23:55    Cardiac Studies    Patient Profile     Ms. [name] is a 65 yo female with a history of medication non-compliance, poorly controlled hypertension (with prior admissions for hypertensive emergency in Nov 2021 and Mar 2022), diastolic CHF, paroxysmal atrial flutter (on Eliquis), tobacco use, PAD (left toe gangrene s/p amputation 07/2020, LE angio 6/82022), COPD, and GERD, who was admitted for acute onset of SOB. Patient ran out of her BP meds a week ago.  Assessment & Plan    1.Hypertensive urgency/emergency -presented with severe HTN, SBP >200. Evidence of pulmonary edema, elevated troponin - history of prior similar admissions due to medication noncompliance - was started on IV NG now off. She is on coreg 25mg  bd, dilt 260, hydral 100 tid, lisinorpil 40. This AM bp is 124/80  2. Acute on chronic diastolic HF - 07/2020 echo LVEF 55-60%, distal anteroseptal and anterolatearl wall hypokinesis, grade II diastolic dysfunction - repeat limited echo pending - BNP 2921, CXR pulm edema - presented with pulm edema in setting of severe HTN - limited I/Os, she is on IV lasix 40mg  bid. Weight are unchanged. Stable renal function. 89% on RA last night, ongoing O2 requirement  - remains volume overloaded, continue IV lasix.     3. 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 Anemia would limit cath consideration, she is also requiring leg amputation in the near future for chronic  wound. Perhaps some chronic obstructive disease made ischemic by significant anemia, CHF, and hypoxia this AM. Optmizie Hgb, follow up echo. No plans for ischemic testing at this time.   4. Paroxysmal aflutter - eliquis on hold during admit, she is on eliquis - remains on dilt, coreg.    5. Prolonged QTc - work to normalize eletrolytes  6. Mitral regurgitation - mod to severe by 07/2020 echo  7. Hypokalemia - she is on oral replacement  8. Anemia - Hgb down to 7, for transfusion per primary team  9. Elevated procalcitonin/Leukocytosis - per primary team    For questions or updates, please contact [facility] Please consult [email or web address] for contact info under  Joanie Coddington, MD  11/15/2020, 8:17 AM

## 2020-11-15 NOTE — Progress Notes (Signed)
*  PRELIMINARY RESULTS* Echocardiogram 2D Echocardiogram has been performed.  Stacey Drain 11/15/2020, 12:51 PM

## 2020-11-15 NOTE — TOC Initial Note (Signed)
Transition of Care [facility]) - Initial/Assessment Note    Patient Details  Name: Sarah Bonilla MRN: [id number] Date of Birth: Oct 24, 1955  Transition [facility] Care [facility]) CM/SW Contact:    Villa Herb, LCSWA Phone Number: 11/15/2020, 12:46 PM  Clinical Narrative:                 CSW spoke with pt in room to complete assessment. Pt states that she lives alone. Pt is independent in completing her ADLs and provides her own transportation. Pt has not had HH services. Pt uses a cane and walker. Pt inquired about wheelchair, CSW added [employer] to pts avs, they provide dme for free to individuals. Pt states that she is currently working on applying for Medicaid and Medicare but is still without insurance currently. TOC to follow.   Expected Discharge Plan: Home/Self Care Barriers to Discharge: Continued Medical Work up   Patient Goals and CMS Choice Patient states their goals for this hospitalization and ongoing recovery are:: Return home CMS Medicare.gov Compare Post Acute Care list provided to:: Patient Choice offered to / list presented to : Patient  Expected Discharge Plan and Services Expected Discharge Plan: Home/Self Care In-house Referral: Clinical Social Work Discharge Planning Services: CM Consult Post Acute Care Choice: NA Living arrangements for the past 2 months: Single Family Home                 DME Arranged: N/A DME Agency: NA       HH Arranged: NA HH Agency: NA        Prior Living Arrangements/Services Living arrangements for the past 2 months: Single Family Home Lives with:: Spouse, Adult Children Patient language and need for interpreter reviewed:: Yes Do you feel safe going back to the place where you live?: Yes      Need for Family Participation in Patient Care: Yes (Comment) Care giver support system in place?: Yes (comment)   Criminal Activity/Legal Involvement Pertinent to Current Situation/Hospitalization: No - Comment as needed  Activities  of Daily Living Home Assistive Devices/Equipment: Dan Humphreys (specify type) ADL Screening (condition at time of admission) Patient's cognitive ability adequate to safely complete daily activities?: Yes Is the patient deaf or have difficulty hearing?: No Does the patient have difficulty seeing, even when wearing glasses/contacts?: No Does the patient have difficulty concentrating, remembering, or making decisions?: No Patient able to express need for assistance with ADLs?: Yes Does the patient have difficulty dressing or bathing?: Yes Independently performs ADLs?: Yes (appropriate for developmental age) Does the patient have difficulty walking or climbing stairs?: Yes Weakness of Legs: Both Weakness of Arms/Hands: None  Permission Sought/Granted                  Emotional Assessment Appearance:: Appears stated age Attitude/Demeanor/Rapport: Engaged Affect (typically observed): Accepting Orientation: : Oriented to Self, Oriented to Place, Oriented to  Time, Oriented to Situation Alcohol / Substance Use: Not Applicable Psych Involvement: No (comment)  Admission diagnosis:  Hypokalemia [E87.6] Hypoxia [R09.02] Acute exacerbation of CHF (congestive heart failure) (HCC) [I50.9] Acute respiratory failure with hypoxia (HCC) [J96.01] Patient Active Problem List   Diagnosis Date Noted   Acute exacerbation of CHF (congestive heart failure) (HCC) 11/14/2020   Acute respiratory failure with hypoxia (HCC) 11/14/2020   Leukocytosis 11/14/2020   Prolonged QT interval 11/14/2020   Wound of left leg 11/04/2020   Chronic diastolic CHF (congestive heart failure) (HCC) 11/04/2020   PAD (peripheral artery disease) (HCC) 11/04/2020   Atrial  flutter (HCC) 08/14/2020   Acute on chronic diastolic congestive heart failure (HCC)    Ulcer of great toe, left, with unspecified severity (HCC)    Dry gangrene (HCC)    Acute pulmonary edema (HCC)    Leg swelling    Acute CHF (congestive heart failure)  (HCC) 04/19/2020   COPD (chronic obstructive pulmonary disease) (HCC) 04/19/2020   Essential hypertension 04/19/2020   Hypertensive emergency 04/19/2020   Hypokalemia 04/19/2020   Localized swelling of left lower extremity 04/19/2020   Hypoalbuminemia 04/19/2020   Elevated brain natriuretic peptide (BNP) level 04/19/2020   Elevated troponin I level 04/19/2020   Thrombocytopenia (HCC) 04/19/2020   Tobacco abuse 04/19/2020   Noncompliance with medication regimen 04/19/2020   PCP:  Avon Gully, MD Pharmacy:   [facility] [phone] - [place], [place] - [address] AT SEC OF [address] [place] [address] Phone: [phone] Fax: [phone]  [facility] [address] [place] [address] Phone: [phone] Fax: [phone]     Social Determinants of Health (SDOH) Interventions    Readmission Risk Interventions Readmission Risk Prevention Plan 11/15/2020 08/21/2020  Transportation Screening Complete Complete  PCP or Specialist Appt within 5-7 Days - Complete  Home Care Screening - Complete  Medication Review (RN CM) - Complete  HRI or Home Care Consult Complete -  Social Work Consult for Recovery Care Planning/Counseling Complete -  Palliative Care Screening Not Applicable -  Medication Review Oceanographer) Complete -  Some recent data might be hidden

## 2020-11-15 NOTE — Progress Notes (Signed)
Progress Note    Sarah Bonilla  LYY:[id number] DOB: 11/05/1955  DOA: 11/13/2020 PCP: Avon Gully, MD      Brief Narrative:    Medical records reviewed and are as summarized below:  Sarah Bonilla is a 65 y.o. female with medical history significant for chronic diastolic CHF, COPD, hypertension, PVD, GERD, tobacco use disorder, medical nonadherence.  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 She was admitted from 08/10/2020 to 08/21/2020 due to left great toe pain secondary to cellulitis/dry gangrene in the setting of moderate PAD at which time she presented with hypertensive emergency which was treated with Cardene drip. She was also admitted from 11/19 - 04/24/2020 due to acute respiratory failure with hypoxia secondary to diastolic CHF and hypertensive emergency.    She presented to the hospital because of worsening shortness of breath.  Reportedly when EMS arrived, oxygen saturation was 77% on room air.  She was found to have acute on chronic diastolic CHF, hypertensive emergency and acute hypoxic respiratory failure.  She was treated with IV Lasix, IV nitroglycerin infusion and BiPAP.  She was weaned off of BiPAP and transitioned to oxygen via nasal cannula.      Assessment/Plan:   Principal Problem:   Acute on chronic diastolic congestive heart failure (HCC) Active Problems:   COPD (chronic obstructive pulmonary disease) (HCC)   Essential hypertension   Hypertensive emergency   Hypokalemia   Hypoalbuminemia   Elevated brain natriuretic peptide (BNP) level   Elevated troponin I level   Wound of left leg   PAD (peripheral artery disease) (HCC)   Acute respiratory failure with hypoxia (HCC)   Leukocytosis   Prolonged QT interval    Body mass index is 18.43 kg/m.   Acute on chronic diastolic CHF: BNP was 2,921.  Continue IV Lasix.  2D echo in March 2022 showed EF of 55 to 60%, grade  2 diastolic dysfunction, moderately elevated pulmonary artery systolic pressure, moderate to severe MR, moderate TR, mild AR.  Repeat limited 2D echo is pending.  Hypertensive emergency: BP has improved and is now borderline low.  She has been weaned off of nitroglycerin drip.  Continue oral antihypertensives but hold hydralazine.  Decrease IV Lasix.  Monitor BP closely.  Acute hypoxic respiratory failure: She has been weaned off of BiPAP.  Continue 3 L/min oxygen via nasal cannula and taper off as able.  Elevated troponins: Troponin went from 161-991.  This is thought to be from demand ischemia.  Appreciate input from cardiologist.  VQ scan was negative for PE.  Acute on chronic anemia: Hemoglobin was 9.8 on 11/04/2020.  Transfuse 1 unit of packed red blood cells.  Risks and benefits to blood transfusion were discussed and patient is agreeable to blood transfusion.  COPD: Stable.  Continue bronchodilators.  Paroxysmal atrial flutter: Discontinue Lovenox because of worsening anemia.  Home Eliquis is on hold.  Hypokalemia: Replete potassium.  Prolonged QTc interval: QTC has worsened from 506 to 562.  This may be due to hypokalemia.  Repeat EKG tomorrow.  Wound of left leg possibly due to PAD: Patient underwent exploration of left peroneal artery, harvest of left greater saphenous vein, amputation of left first toe on, 3/22 tolerated well. She underwent lower extremity angiography on 6/8 as a means to salvage the left limb prior to opting for potation.  Continue aspirin and Lipitor.  Outpatient follow-up with vascular surgeon.  Tobacco use disorder: Counseled to  quit.  Medical nonadherence: Patient was counseled on the importance of medical adherence.         Diet Order             Diet heart healthy/carb modified Room service appropriate? Yes; Fluid consistency: Thin  Diet effective now                       Consultants: Cardiologist  Procedures: None    Medications:    aspirin EC  81 mg Oral Daily   atorvastatin  10 mg Oral Daily   carvedilol  25 mg Oral BID WC   Chlorhexidine Gluconate Cloth  6 each Topical Daily   diltiazem  360 mg Oral Daily   furosemide  40 mg Intravenous Q12H   lisinopril  40 mg Oral Daily   Continuous Infusions:     Anti-infectives (From admission, onward)    None              Family Communication/Anticipated D/C date and plan/Code Status   DVT prophylaxis: SCDs Start: 11/14/20 0412     Code Status: Full Code  Family Communication: None Disposition Plan:    Status is: Inpatient  Remains inpatient appropriate because:IV treatments appropriate due to intensity of illness or inability to take PO  Dispo: The patient is from: Home              Anticipated d/c is to: Home              Patient currently is not medically stable to d/c.   Difficult to place patient No           Subjective:   Interval events noted.  No chest pain or shortness of breath.  Objective:    Vitals:   11/15/20 1215 11/15/20 1230 11/15/20 1335 11/15/20 1345  BP: (!) 88/52 (!) 116/47 (!) 109/58 (!) 112/57  Pulse: 70 68 72 72  Resp: 20 (!) 26 19 13   Temp:   99 F (37.2 C)   TempSrc:   Oral   SpO2: 96% 94% 94% 95%  Weight:      Height:       No data found.   Intake/Output Summary (Last 24 hours) at 11/15/2020 1448 Last data filed at 11/15/2020 1330 Gross per 24 hour  Intake 1077.1 ml  Output 800 ml  Net 277.1 ml   Filed Weights   11/13/20 2256 11/14/20 2100 11/15/20 0500  Weight: 45 kg 45.8 kg 45.7 kg    Exam:  GEN: NAD SKIN: wound on medial aspect of mid left leg.  EYES: EOMI ENT: MMM CV: RRR PULM: Bibasilar rales.  No wheezing heard. ABD: soft, ND, NT, +BS CNS: AAO x 3, non focal EXT: Left leg edema, no tenderness        Data Reviewed:   I have personally reviewed following labs and imaging  studies:  Labs: Labs show the following:   Basic Metabolic Panel: Recent Labs  Lab 11/13/20 2321 11/14/20 0128 11/14/20 0530 11/15/20 0524  NA 141  --  141 142  K 2.7*  --  3.6 3.0*  CL 106  --  103 103  CO2 24  --  27 31  GLUCOSE 118*  --  98 111*  BUN 18  --  19 19  CREATININE 0.63  --  0.60 0.78  CALCIUM 8.5*  --  8.5* 8.2*  MG  --   --  1.7 2.0  PHOS  --  5.2*  5.6*  --    GFR Estimated Creatinine Clearance: 51.3 mL/min (by C-G formula based on SCr of 0.78 mg/dL). Liver Function Tests: Recent Labs  Lab 11/13/20 2321 11/15/20 0524  AST 21 15  ALT 16 13  ALKPHOS 104 79  BILITOT 0.7 0.4  PROT 7.2 6.2*  ALBUMIN 2.7* 2.3*   No results for input(s): LIPASE, AMYLASE in the last 168 hours. No results for input(s): AMMONIA in the last 168 hours. Coagulation profile Recent Labs  Lab 11/13/20 2321 11/15/20 0524  INR 1.2 1.3*    CBC: Recent Labs  Lab 11/13/20 2321 11/15/20 0524  WBC 18.0* 10.0  NEUTROABS 14.5*  --   HGB 8.1* 7.0*  HCT 26.2* 23.0*  MCV 85.9 84.9  PLT 327 257   Cardiac Enzymes: No results for input(s): CKTOTAL, CKMB, CKMBINDEX, TROPONINI in the last 168 hours. BNP (last 3 results) No results for input(s): PROBNP in the last 8760 hours. CBG: No results for input(s): GLUCAP in the last 168 hours. D-Dimer: No results for input(s): DDIMER in the last 72 hours. Hgb A1c: No results for input(s): HGBA1C in the last 72 hours. Lipid Profile: No results for input(s): CHOL, HDL, LDLCALC, TRIG, CHOLHDL, LDLDIRECT in the last 72 hours. Thyroid function studies: No results for input(s): TSH, T4TOTAL, T3FREE, THYROIDAB in the last 72 hours.  Invalid input(s): FREET3 Anemia work up: No results for input(s): VITAMINB12, FOLATE, FERRITIN, TIBC, IRON, RETICCTPCT in the last 72 hours. Sepsis Labs: Recent Labs  Lab 11/13/20 2321 11/14/20 0834 11/15/20 0524  PROCALCITON  --  2.19  --   WBC 18.0*  --  10.0    Microbiology Recent Results (from the  past 240 hour(s))  Resp Panel by RT-PCR (Flu A&B, Covid) Nasopharyngeal Swab     Status: None   Collection Time: 11/14/20 12:40 AM   Specimen: Nasopharyngeal Swab; Nasopharyngeal(NP) swabs in vial transport medium  Result Value Ref Range Status   SARS Coronavirus 2 by RT PCR NEGATIVE NEGATIVE Final    Comment: (NOTE) SARS-CoV-2 target nucleic acids are NOT DETECTED.  The SARS-CoV-2 RNA is generally detectable in upper respiratory specimens during the acute phase of infection. The lowest concentration of SARS-CoV-2 viral copies this assay can detect is 138 copies/mL. A negative result does not preclude SARS-Cov-2 infection and should not be used as the sole basis for treatment or other patient management decisions. A negative result may occur with  improper specimen collection/handling, submission of specimen other than nasopharyngeal swab, presence of viral mutation(s) within the areas targeted by this assay, and inadequate number of viral copies(<138 copies/mL). A negative result must be combined with clinical observations, patient history, and epidemiological information. The expected result is Negative.  Fact Sheet for Patients:  BloggerCourse.com  Fact Sheet for Healthcare Providers:  SeriousBroker.it  This test is no t yet approved or cleared by the [place] FDA and  has been authorized for detection and/or diagnosis of SARS-CoV-2 by FDA under an Emergency Use Authorization (EUA). This EUA will remain  in effect (meaning this test can be used) for the duration of the COVID-19 declaration under Section 564(b)(1) of the Act, 21 U.S.C.section 360bbb-3(b)(1), unless the authorization is terminated  or revoked sooner.       Influenza A by PCR NEGATIVE NEGATIVE Final   Influenza B by PCR NEGATIVE NEGATIVE Final    Comment: (NOTE) The Xpert Xpress SARS-CoV-2/FLU/RSV plus assay is intended as an aid in the diagnosis of  influenza from Nasopharyngeal swab specimens and should  not be used as a sole basis for treatment. Nasal washings and aspirates are unacceptable for Xpert Xpress SARS-CoV-2/FLU/RSV testing.  Fact Sheet for Patients: BloggerCourse.comhttps://www.fda.gov/media/152166/download  Fact Sheet for Healthcare Providers: SeriousBroker.ithttps://www.fda.gov/media/152162/download  This test is not yet approved or cleared by the [place] FDA and has been authorized for detection and/or diagnosis of SARS-CoV-2 by FDA under an Emergency Use Authorization (EUA). This EUA will remain in effect (meaning this test can be used) for the duration of the COVID-19 declaration under Section 564(b)(1) of the Act, 21 U.S.C. section 360bbb-3(b)(1), unless the authorization is terminated or revoked.  Performed at [facility], [address] [place], [place] [address]   MRSA Next Gen by PCR, Nasal     Status: None   Collection Time: 11/14/20 11:51 PM   Specimen: Nasal Mucosa; Nasal Swab  Result Value Ref Range Status   MRSA by PCR Next Gen NOT DETECTED NOT DETECTED Final    Comment: (NOTE) The GeneXpert MRSA Assay (FDA approved for NASAL specimens only), is one component of a comprehensive MRSA colonization surveillance program. It is not intended to diagnose MRSA infection nor to guide or monitor treatment for MRSA infections. Test performance is not FDA approved in patients less than 64 years old. Performed at [facility], [address] [place], [place] [address]     Procedures and diagnostic studies:  NM Pulmonary Perfusion  Result Date: 11/14/2020 CLINICAL DATA:  Onset shortness of breath yesterday. EXAM: NUCLEAR MEDICINE PERFUSION LUNG SCAN TECHNIQUE: Perfusion images were obtained in multiple projections after intravenous injection of radiopharmaceutical. Ventilation scans intentionally deferred if perfusion scan and chest x-ray adequate for interpretation during COVID 19 epidemic. RADIOPHARMACEUTICALS:  4.4 mCi Tc-7882m MAA  IV COMPARISON:  Single-view of the chest 11/13/2020. FINDINGS: No segmental or subsegmental defect is identified. IMPRESSION: Negative for pulmonary embolus. Electronically Signed   By: Drusilla Kannerhomas  Dalessio M.D.   On: 11/14/2020 11:29   DG Chest Portable 1 View  Result Date: 11/13/2020 CLINICAL DATA:  Shortness of breath EXAM: PORTABLE CHEST 1 VIEW COMPARISON:  08/10/2020, CT 08/11/2020 FINDINGS: Hyperinflation with emphysematous disease. Diffuse increased right greater than left interstitial and ground-glass opacity. There is mild cardiomegaly with central congestion. Aortic atherosclerosis. Peripheral lucency in the left thorax suspect due to artifact that extends external to the chest wall. IMPRESSION: 1. Emphysema 2. Cardiomegaly with central congestion. Diffuse increased right greater than left interstitial and ground-glass opacity, suspect acute superimposed edema or diffuse pneumonia on underlying chronic disease 3. Peripheral lucency in the left chest favored to be due to external artifact as opposed to pneumothorax. Electronically Signed   By: Jasmine PangKim  Fujinaga M.D.   On: 11/13/2020 23:55               LOS: 1 day   Yanel Dombrosky  [facility]   Pager on [email or web address]. If 7PM-7AM, please contact night-coverage at [email or web address]     11/15/2020, 2:48 PM

## 2020-11-15 NOTE — Telephone Encounter (Signed)
Reach out to patient daughter Glee Arvin) but was not able leave a message

## 2020-11-16 ENCOUNTER — Other Ambulatory Visit: Payer: Self-pay

## 2020-11-16 LAB — BASIC METABOLIC PANEL
Anion gap: 9 (ref 5–15)
BUN: 17 mg/dL (ref 8–23)
CO2: 30 mmol/L (ref 22–32)
Calcium: 8.9 mg/dL (ref 8.9–10.3)
Chloride: 103 mmol/L (ref 98–111)
Creatinine, Ser: 0.62 mg/dL (ref 0.44–1.00)
GFR, Estimated: >60 mL/min (ref >60)
Glucose, Bld: 105 mg/dL — ABNORMAL HIGH (ref 70–99)
Potassium: 3.3 mmol/L — ABNORMAL LOW (ref 3.5–5.1)
Sodium: 142 mmol/L (ref 135–145)

## 2020-11-16 LAB — TYPE AND SCREEN
ABO/RH(D): B POS
Antibody Screen: NEGATIVE
Unit division: 0

## 2020-11-16 LAB — CBC WITH DIFFERENTIAL/PLATELET
Abs Immature Granulocytes: 0.05 10*3/uL (ref 0.00–0.07)
Basophils Absolute: 0 10*3/uL (ref 0.0–0.1)
Basophils Relative: 0 %
Eosinophils Absolute: 0.1 10*3/uL (ref 0.0–0.5)
Eosinophils Relative: 1 %
HCT: 31.4 % — ABNORMAL LOW (ref 36.0–46.0)
Hemoglobin: 9.7 g/dL — ABNORMAL LOW (ref 12.0–15.0)
Immature Granulocytes: 0 %
Lymphocytes Relative: 22 %
Lymphs Abs: 2.5 10*3/uL (ref 0.7–4.0)
MCH: 26.4 pg (ref 26.0–34.0)
MCHC: 30.9 g/dL (ref 30.0–36.0)
MCV: 85.3 fL (ref 80.0–100.0)
Monocytes Absolute: 0.7 10*3/uL (ref 0.1–1.0)
Monocytes Relative: 6 %
Neutro Abs: 8 10*3/uL — ABNORMAL HIGH (ref 1.7–7.7)
Neutrophils Relative %: 71 %
Platelets: 335 10*3/uL (ref 150–400)
RBC: 3.68 MIL/uL — ABNORMAL LOW (ref 3.87–5.11)
RDW: 18.6 % — ABNORMAL HIGH (ref 11.5–15.5)
WBC: 11.3 10*3/uL — ABNORMAL HIGH (ref 4.0–10.5)
nRBC: 0 % (ref 0.0–0.2)

## 2020-11-16 LAB — BPAM RBC
Blood Product Expiration Date: 202207252359
ISSUE DATE / TIME: 202206171045
Unit Type and Rh: 5100

## 2020-11-16 LAB — MAGNESIUM: Magnesium: 1.9 mg/dL (ref 1.7–2.4)

## 2020-11-16 MED ORDER — LISINOPRIL 40 MG PO TABS: 40.0000 mg | ORAL_TABLET | Freq: Every day | ORAL | 0 refills | Status: AC

## 2020-11-16 MED ORDER — DILTIAZEM HCL ER COATED BEADS 360 MG PO CP24: 360.0000 mg | ORAL_CAPSULE | Freq: Every day | ORAL | 0 refills | Status: AC

## 2020-11-16 MED ORDER — POTASSIUM CHLORIDE CRYS ER 20 MEQ PO TBCR
40.0000 meq | EXTENDED_RELEASE_TABLET | ORAL | Status: AC
  Administered 2020-11-16 (×2): 40 meq via ORAL
  Filled 2020-11-16 (×2): qty 2

## 2020-11-16 MED ORDER — FUROSEMIDE 40 MG PO TABS: 40.0000 mg | ORAL_TABLET | Freq: Every day | ORAL | 0 refills | Status: AC

## 2020-11-16 NOTE — TOC Transition Note (Signed)
Transition of Care [facility]) - CM/SW Discharge Note   Patient Details  Name: Sarah Bonilla MRN: [id number] Date of Birth: 22-Jul-1955  [facility] CM/SW Contact:  Barry Brunner, LCSW Phone Number: 11/16/2020, 4:50 PM   Clinical Narrative:    CSW notified of patient's readiness for discharge and [facility] requirements. Patient agreeable to [facility]. CSW consulted on call supervisor Jiles Crocker for LOG. Jiles Crocker agreeable to LOG. CSW placed referral for [facility] with [facility]. Barbara Cower with [facility] agreeable to provide [facility] services with LOG. TOC signing off.    Final next level of care: Home w Home Health Services Barriers to Discharge: Barriers Resolved   Patient Goals and CMS Choice Patient states their goals for this hospitalization and ongoing recovery are:: Return home with [facility] CMS Medicare.gov Compare Post Acute Care list provided to:: Patient Choice offered to / list presented to : Patient  Discharge Placement                    Patient and family notified of of transfer: 11/16/20  Discharge Plan and Services In-house Referral: Clinical Social Work Discharge Planning Services: CM Consult Post Acute Care Choice: NA          DME Arranged: N/A DME Agency: NA       HH Arranged: RN, PT HH Agency: [facility] Date HH Agency Contacted: 11/16/20 Time HH Agency Contacted: 1649 Representative spoke with at [facility] Agency: Barbara Cower  Social Determinants of Health (SDOH) Interventions     Readmission Risk Interventions Readmission Risk Prevention Plan 11/15/2020 08/21/2020  Transportation Screening Complete Complete  PCP or Specialist Appt within 5-7 Days - Complete  Home Care Screening - Complete  Medication Review (RN CM) - Complete  HRI or Home Care Consult Complete -  Social Work Consult for Recovery Care Planning/Counseling Complete -  Palliative Care Screening Not Applicable -  Medication Review Oceanographer) Complete -  Some recent data  might be hidden

## 2020-11-16 NOTE — Progress Notes (Signed)
Patient ambulated well from the bed to the hallway and back. Patient's O2 saturation resting on room air was 98%. Patient's O2 saturation while ambulating on RA was 94 %.

## 2020-11-16 NOTE — Discharge Summary (Signed)
Physician Discharge Summary  Sarah Bonilla B Bieser EAV:[id number] DOB: [birth year] DOA: 11/13/2020  PCP: Avon GullyFanta, Tesfaye, MD  Admit date: 11/13/2020 Discharge date: 11/16/2020  Discharge disposition: Home   Recommendations for Outpatient Follow-Up:   Follow-up with PCP in 1 week. Follow-up with Dr. Wyn Quakerew, vascular surgeon, as scheduled on 11/20/2020   Discharge Diagnosis:   Principal Problem:   Acute on chronic diastolic congestive heart failure (HCC) Active Problems:   COPD (chronic obstructive pulmonary disease) (HCC)   Essential hypertension   Hypertensive emergency   Hypokalemia   Hypoalbuminemia   Elevated brain natriuretic peptide (BNP) level   Elevated troponin I level   Wound of left leg   PAD (peripheral artery disease) (HCC)   Acute respiratory failure with hypoxia (HCC)   Leukocytosis   Prolonged QT interval    Discharge Condition: Stable.  Diet recommendation:  Diet Order             Diet - low sodium heart healthy           Diet heart healthy/carb modified Room service appropriate? Yes; Fluid consistency: Thin  Diet effective now                     Code Status: Full Code     Hospital Course:   Ms. Sarah Bonilla is a 65 y.o. female with medical history significant for chronic diastolic CHF, COPD, hypertension, PVD, GERD, tobacco use disorder, medical nonadherence. 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 She was admitted from 08/10/2020 to 08/21/2020 due to left great toe pain secondary to cellulitis/dry gangrene in the setting of moderate PAD at which time she presented with hypertensive emergency which was treated with Cardene drip. She was also admitted from 11/19 - 04/24/2020 due to acute respiratory failure with hypoxia secondary to diastolic CHF and hypertensive emergency.     She presented to the hospital because of worsening shortness of breath.  Reportedly when EMS  arrived, oxygen saturation was 77% on room air.   She was found to have acute on chronic diastolic CHF, hypertensive emergency and acute hypoxic respiratory failure.  She was treated with IV Lasix, IV nitroglycerin infusion and BiPAP.  She was weaned off of BiPAP and transitioned to oxygen via nasal cannula.  She has successfully been weaned off of oxygen and she is tolerating room air even with ambulation.   Troponins were elevated and cardiologist was consulted to assist with management.  Elevated troponins were thought to be due to demand ischemia.  Limited 2D echo showed EF estimated at 60 to 65%, severely dilated left and right atria, moderate mitral regurgitation and possible small PFO with left-to-right shunt.  Patient is already on aspirin and Eliquis. She developed symptomatic anemia with hemoglobin of 7.  Hemoglobin was 9.8 on 11/04/2020.  She was transfused with 1 unit of packed red blood cells and H&H has improved.  She had hypokalemia that was repleted.  She also had prolonged QTc interval Which has improved from 5 62-497. Her condition has improved significantly and she is deemed stable for discharge home today.    Discharge plan was discussed with her daughter, Sarah Bonilla.  There was another daughter who was on speaker phone with Latoya.  All their questions were answered.  They requested refills for lisinopril, Cardizem and Lasix which have been prescribed.  Medical Consultants:   Cardiologist   Discharge Exam:    Vitals:   11/15/20  2102 11/16/20 0518 11/16/20 0706 11/16/20 1426  BP: 124/70 (!) 153/82 (!) 145/70 119/88  Pulse: 72 77 73 75  Resp: 18 16 16 19   Temp: 98.1 F (36.7 C) 97.8 F (36.6 C) 98.4 F (36.9 C) 97.6 F (36.4 C)  TempSrc: Oral Oral Oral Oral  SpO2: 93% (!) 85% 93% 92%  Weight:      Height:         GEN: NAD SKIN: Wound on medial aspect of the mid left leg EYES: EOMI ENT: MMM CV: RRR PULM: CTA B ABD: soft, ND, NT, +BS CNS: AAO x 3, non  focal EXT: Mild left leg edema.  No tenderness or erythema.   The results of significant diagnostics from this hospitalization (including imaging, microbiology, ancillary and laboratory) are listed below for reference.     Procedures and Diagnostic Studies:   NM Pulmonary Perfusion  Result Date: 11/14/2020 CLINICAL DATA:  Onset shortness of breath yesterday. EXAM: NUCLEAR MEDICINE PERFUSION LUNG SCAN TECHNIQUE: Perfusion images were obtained in multiple projections after intravenous injection of radiopharmaceutical. Ventilation scans intentionally deferred if perfusion scan and chest x-ray adequate for interpretation during COVID 19 epidemic. RADIOPHARMACEUTICALS:  4.4 mCi Tc-51m MAA IV COMPARISON:  Single-view of the chest 11/13/2020. FINDINGS: No segmental or subsegmental defect is identified. IMPRESSION: Negative for pulmonary embolus. Electronically Signed   By: 11/15/2020 M.D.   On: 11/14/2020 11:29   DG Chest Portable 1 View  Result Date: 11/13/2020 CLINICAL DATA:  Shortness of breath EXAM: PORTABLE CHEST 1 VIEW COMPARISON:  08/10/2020, CT 08/11/2020 FINDINGS: Hyperinflation with emphysematous disease. Diffuse increased right greater than left interstitial and ground-glass opacity. There is mild cardiomegaly with central congestion. Aortic atherosclerosis. Peripheral lucency in the left thorax suspect due to artifact that extends external to the chest wall. IMPRESSION: 1. Emphysema 2. Cardiomegaly with central congestion. Diffuse increased right greater than left interstitial and ground-glass opacity, suspect acute superimposed edema or diffuse pneumonia on underlying chronic disease 3. Peripheral lucency in the left chest favored to be due to external artifact as opposed to pneumothorax. Electronically Signed   By: 08/13/2020 M.D.   On: 11/13/2020 23:55     Labs:   Basic Metabolic Panel: Recent Labs  Lab 11/13/20 2321 11/14/20 0128 11/14/20 0530 11/15/20 0524 11/16/20 0612   NA 141  --  141 142 142  K 2.7*  --  3.6 3.0* 3.3*  CL 106  --  103 103 103  CO2 24  --  27 31 30   GLUCOSE 118*  --  98 111* 105*  BUN 18  --  19 19 17   CREATININE 0.63  --  0.60 0.78 0.62  CALCIUM 8.5*  --  8.5* 8.2* 8.9  MG  --   --  1.7 2.0 1.9  PHOS  --  5.2* 5.6*  --   --    GFR Estimated Creatinine Clearance: 51.3 mL/min (by C-G formula based on SCr of 0.62 mg/dL). Liver Function Tests: Recent Labs  Lab 11/13/20 2321 11/15/20 0524  AST 21 15  ALT 16 13  ALKPHOS 104 79  BILITOT 0.7 0.4  PROT 7.2 6.2*  ALBUMIN 2.7* 2.3*   No results for input(s): LIPASE, AMYLASE in the last 168 hours. No results for input(s): AMMONIA in the last 168 hours. Coagulation profile Recent Labs  Lab 11/13/20 2321 11/15/20 0524  INR 1.2 1.3*    CBC: Recent Labs  Lab 11/13/20 2321 11/15/20 0524 11/15/20 1745 11/16/20 0612  WBC 18.0* 10.0  --  11.3*  NEUTROABS 14.5*  --   --  8.0*  HGB 8.1* 7.0* 9.4* 9.7*  HCT 26.2* 23.0* 30.3* 31.4*  MCV 85.9 84.9  --  85.3  PLT 327 257  --  335   Cardiac Enzymes: No results for input(s): CKTOTAL, CKMB, CKMBINDEX, TROPONINI in the last 168 hours. BNP: Invalid input(s): POCBNP CBG: No results for input(s): GLUCAP in the last 168 hours. D-Dimer No results for input(s): DDIMER in the last 72 hours. Hgb A1c No results for input(s): HGBA1C in the last 72 hours. Lipid Profile No results for input(s): CHOL, HDL, LDLCALC, TRIG, CHOLHDL, LDLDIRECT in the last 72 hours. Thyroid function studies No results for input(s): TSH, T4TOTAL, T3FREE, THYROIDAB in the last 72 hours.  Invalid input(s): FREET3 Anemia work up No results for input(s): VITAMINB12, FOLATE, FERRITIN, TIBC, IRON, RETICCTPCT in the last 72 hours. Microbiology Recent Results (from the past 240 hour(s))  Resp Panel by RT-PCR (Flu A&B, Covid) Nasopharyngeal Swab     Status: None   Collection Time: 11/14/20 12:40 AM   Specimen: Nasopharyngeal Swab; Nasopharyngeal(NP) swabs in vial  transport medium  Result Value Ref Range Status   SARS Coronavirus 2 by RT PCR NEGATIVE NEGATIVE Final    Comment: (NOTE) SARS-CoV-2 target nucleic acids are NOT DETECTED.  The SARS-CoV-2 RNA is generally detectable in upper respiratory specimens during the acute phase of infection. The lowest concentration of SARS-CoV-2 viral copies this assay can detect is 138 copies/mL. A negative result does not preclude SARS-Cov-2 infection and should not be used as the sole basis for treatment or other patient management decisions. A negative result may occur with  improper specimen collection/handling, submission of specimen other than nasopharyngeal swab, presence of viral mutation(s) within the areas targeted by this assay, and inadequate number of viral copies(<138 copies/mL). A negative result must be combined with clinical observations, patient history, and epidemiological information. The expected result is Negative.  Fact Sheet for Patients:  BloggerCourse.com  Fact Sheet for Healthcare Providers:  SeriousBroker.it  This test is no t yet approved or cleared by the [place] FDA and  has been authorized for detection and/or diagnosis of SARS-CoV-2 by FDA under an Emergency Use Authorization (EUA). This EUA will remain  in effect (meaning this test can be used) for the duration of the COVID-19 declaration under Section 564(b)(1) of the Act, 21 U.S.C.section 360bbb-3(b)(1), unless the authorization is terminated  or revoked sooner.       Influenza A by PCR NEGATIVE NEGATIVE Final   Influenza B by PCR NEGATIVE NEGATIVE Final    Comment: (NOTE) The Xpert Xpress SARS-CoV-2/FLU/RSV plus assay is intended as an aid in the diagnosis of influenza from Nasopharyngeal swab specimens and should not be used as a sole basis for treatment. Nasal washings and aspirates are unacceptable for Xpert Xpress SARS-CoV-2/FLU/RSV testing.  Fact  Sheet for Patients: BloggerCourse.com  Fact Sheet for Healthcare Providers: SeriousBroker.it  This test is not yet approved or cleared by the [place] FDA and has been authorized for detection and/or diagnosis of SARS-CoV-2 by FDA under an Emergency Use Authorization (EUA). This EUA will remain in effect (meaning this test can be used) for the duration of the COVID-19 declaration under Section 564(b)(1) of the Act, 21 U.S.C. section 360bbb-3(b)(1), unless the authorization is terminated or revoked.  Performed at [facility], [address], [place], [place] [address]   MRSA Next Gen by PCR, Nasal     Status: None   Collection Time: 11/14/20 11:51  PM   Specimen: Nasal Mucosa; Nasal Swab  Result Value Ref Range Status   MRSA by PCR Next Gen NOT DETECTED NOT DETECTED Final    Comment: (NOTE) The GeneXpert MRSA Assay (FDA approved for NASAL specimens only), is one component of a comprehensive MRSA colonization surveillance program. It is not intended to diagnose MRSA infection nor to guide or monitor treatment for MRSA infections. Test performance is not FDA approved in patients less than 1 years old. Performed at [facility], [address] [place], [place] [address]      Discharge Instructions:   Discharge Instructions     Diet - low sodium heart healthy   Complete by: As directed    Discharge instructions   Complete by: As directed    Follow-up with Dr. Wyn Quaker, vascular surgeon, as scheduled on 11/20/2020.   Discharge wound care:   Complete by: As directed    Avoid laying on your back for too long.  Keeping any open wound clean and dry.   Increase activity slowly   Complete by: As directed       Allergies as of 11/16/2020   No Known Allergies      Medication List     STOP taking these medications    enoxaparin 40 MG/0.4ML injection Commonly known as: Lovenox   losartan 50 MG tablet Commonly known as:  COZAAR   pantoprazole 40 MG tablet Commonly known as: PROTONIX       TAKE these medications    acetaminophen 325 MG tablet Commonly known as: TYLENOL Take 650 mg by mouth every 6 (six) hours as needed for moderate pain or headache.   albuterol 108 (90 Base) MCG/ACT inhaler Commonly known as: VENTOLIN HFA Inhale 1 puff into the lungs 4 (four) times daily as needed for wheezing or shortness of breath.   apixaban 5 MG Tabs tablet Commonly known as: ELIQUIS TAKE 1 TABLET (5 MG TOTAL) BY MOUTH TWO TIMES DAILY.   aspirin EC 81 MG tablet Take 1 tablet (81 mg total) by mouth daily.   atorvastatin 10 MG tablet Commonly known as: Lipitor Take 1 tablet (10 mg total) by mouth daily.   carvedilol 25 MG tablet Commonly known as: COREG Take 1 tablet (25 mg total) by mouth 2 (two) times daily with a meal.   diltiazem 360 MG 24 hr capsule Commonly known as: CARDIZEM CD Take 1 capsule (360 mg total) by mouth daily.   furosemide 40 MG tablet Commonly known as: LASIX Take 1 tablet (40 mg total) by mouth daily.   hydrALAZINE 100 MG tablet Commonly known as: APRESOLINE Take 1 tablet (100 mg total) by mouth every 8 (eight) hours.   HYDROcodone-acetaminophen 5-325 MG tablet Commonly known as: NORCO/VICODIN Take 1 tablet by mouth every 6 (six) hours as needed for severe pain.   isosorbide mononitrate 60 MG 24 hr tablet Commonly known as: IMDUR Take 1 tablet (60 mg total) by mouth daily.   lisinopril 40 MG tablet Commonly known as: ZESTRIL Take 1 tablet (40 mg total) by mouth daily.   potassium chloride SA 20 MEQ tablet Commonly known as: KLOR-CON Take 1 tablet (20 mEq total) by mouth 2 (two) times daily for 10 days.               Discharge Care Instructions  (From admission, onward)           Start     Ordered   11/16/20 0000  Discharge wound care:  Comments: Avoid laying on your back for too long.  Keeping any open wound clean and dry.   11/16/20 1524             Follow-up Information     [facility] Call.   Why: Call to inquire about getting a wheelchair. Contact information: [phone]                 Time coordinating discharge: 35 minutes  Signed:  Lurene Shadow  [facility] 11/16/2020, 3:25 PM   Pager on [email or web address]. If 7PM-7AM, please contact night-coverage at [email or web address]

## 2020-11-16 NOTE — Progress Notes (Signed)
SATURATION QUALIFICATIONS: (This note is used to comply with regulatory documentation for home oxygen)  Patient Saturations on Room Air at Rest = 98%  Patient Saturations on Room Air while Ambulating = 94%  Patient Saturations on 0 Liters of oxygen while Ambulating = 94%  Please briefly explain why patient needs home oxygen: 

## 2020-11-16 NOTE — Progress Notes (Signed)
EKG done and given to nurse 

## 2020-11-16 NOTE — Progress Notes (Signed)
Wound to left foot changed.  Dressing that was in place was adhered to the wound bed.  Dressing moistened with saline and carefully loosened and removed from wound bed.  Wound cleaned with saline and redressed using a vaseline gauze, 4x4s and gauze with tape to secure.  Patient tolerated dressing change moderately well. She did complain of significant pain with dressing removal.  Patient given pain medication by Joy, LPN during dressing change.

## 2020-11-17 ENCOUNTER — Other Ambulatory Visit (INDEPENDENT_AMBULATORY_CARE_PROVIDER_SITE_OTHER): Payer: Self-pay | Admitting: Vascular Surgery

## 2020-11-17 DIAGNOSIS — Z95820 Peripheral vascular angioplasty status with implants and grafts: Secondary | ICD-10-CM

## 2020-11-17 DIAGNOSIS — I739 Peripheral vascular disease, unspecified: Secondary | ICD-10-CM

## 2020-11-20 ENCOUNTER — Telehealth (INDEPENDENT_AMBULATORY_CARE_PROVIDER_SITE_OTHER): Payer: Self-pay

## 2020-11-20 ENCOUNTER — Encounter (INDEPENDENT_AMBULATORY_CARE_PROVIDER_SITE_OTHER): Payer: Self-pay | Admitting: Nurse Practitioner

## 2020-11-20 ENCOUNTER — Ambulatory Visit (INDEPENDENT_AMBULATORY_CARE_PROVIDER_SITE_OTHER): Payer: Self-pay

## 2020-11-20 ENCOUNTER — Other Ambulatory Visit: Payer: Self-pay

## 2020-11-20 ENCOUNTER — Ambulatory Visit (INDEPENDENT_AMBULATORY_CARE_PROVIDER_SITE_OTHER): Payer: Self-pay | Admitting: Nurse Practitioner

## 2020-11-20 VITALS — BP 189/91 | HR 77 | Ht 62.0 in | Wt 98.0 lb

## 2020-11-20 DIAGNOSIS — I10 Essential (primary) hypertension: Secondary | ICD-10-CM

## 2020-11-20 DIAGNOSIS — Z720 Tobacco use: Secondary | ICD-10-CM

## 2020-11-20 DIAGNOSIS — Z95820 Peripheral vascular angioplasty status with implants and grafts: Secondary | ICD-10-CM

## 2020-11-20 DIAGNOSIS — I70262 Atherosclerosis of native arteries of extremities with gangrene, left leg: Secondary | ICD-10-CM

## 2020-11-20 DIAGNOSIS — I739 Peripheral vascular disease, unspecified: Secondary | ICD-10-CM

## 2020-11-20 MED ORDER — GABAPENTIN 300 MG PO CAPS: 300.0000 mg | ORAL_CAPSULE | Freq: Every day | ORAL | 2 refills | Status: DC

## 2020-11-20 MED ORDER — GABAPENTIN 300 MG PO CAPS: 300.0000 mg | ORAL_CAPSULE | Freq: Every day | ORAL | 2 refills | Status: AC

## 2020-11-20 MED ORDER — HYDROCODONE-ACETAMINOPHEN 5-325 MG PO TABS: 1.0000 | ORAL_TABLET | ORAL | 0 refills | Status: AC | PRN

## 2020-11-20 MED ORDER — HYDROCODONE-ACETAMINOPHEN 5-325 MG PO TABS: 1.0000 | ORAL_TABLET | ORAL | 0 refills | Status: DC | PRN

## 2020-11-20 NOTE — Telephone Encounter (Signed)
Prescriptions has been sent to [facility] as requested by patient daughter

## 2020-11-20 NOTE — Progress Notes (Signed)
Subjective:    Patient ID: Sarah Bonilla, female    DOB: 03-16-1956, 65 y.o.   MRN: [id number] Chief Complaint  Patient presents with   Follow-up    [facility] 2 wk fu LE angio     Sarah Bonilla is a 65 y.o. female.  The patient returns today after recent intervention on 11/06/2020 including:  Procedure(s) Performed:             1.  Ultrasound guidance for vascular access right femoral artery             2.  Catheter placement into left common femoral artery from right femoral approach             3.  Aortogram and selective left lower extremity angiogram             4.  Percutaneous transluminal angioplasty of left peroneal artery with 2.5 mm diameter angioplasty balloon and of the tibioperoneal trunk with 4 mm diameter Lutonix drug-coated angioplasty balloon             5.   Percutaneous transluminal angioplasty of the left mid to distal SFA and popliteal arteries with 5 mm diameter by 22 cm length Lutonix drug-coated angioplasty balloon             6.  Viabahn stent placement to the left SFA and popliteal arteries with a 6 mm diameter by 25 cm length stent for multiple areas of greater than 50% stenosis after angioplasty             7.  StarClose closure device right femoral artery   Prior to this intervention the patient had an attempted fin peroneal bypass with the great toe amputation in March.  Due to significant pain amputation has been recommended of the left lower extremity.  In reviewing the previous intervention peroneal artery was a one-vessel runoff that was not suitable for distal bypass.  Shortly after the patient's intervention she was admitted at [facility] for hypoxia and subsequently was diuresed and stabilized.  She continues to have severe pain and soreness in her left great toe amputation site.  The wound is covered with slough and has a slight odor attached.  She has been doing wet-to-dry dressings on this side as well as the incision site on her lower extremity.  This  incision site has fully dehisced and the wound is approximately 3 inches x 3 inches x 1 inch.  She also reports having some phantom limb pain within the toe itself.  The patient has an ABI of 1.03 on the right and 1.25 on the left.  The right has biphasic/monophasic waveforms with biphasic waveforms on the left.       Review of Systems  Cardiovascular:  Positive for leg swelling.  Skin:  Positive for wound.  Neurological:  Positive for weakness.  All other systems reviewed and are negative.     Objective:   Physical Exam Vitals reviewed.  HENT:     Head: Normocephalic.  Cardiovascular:     Rate and Rhythm: Normal rate.     Pulses: Decreased pulses.  Pulmonary:     Effort: Pulmonary effort is normal.  Musculoskeletal:     Left lower leg: Edema present.  Skin:    Findings: Wound present.  Neurological:     Mental Status: She is alert and oriented to person, place, and time.     Motor: Weakness present.     Gait: Gait abnormal.  Psychiatric:  Mood and Affect: Mood normal.        Behavior: Behavior normal.        Thought Content: Thought content normal.        Judgment: Judgment normal.    BP (!) 189/91   Pulse 77   Ht 5\' 2"  (1.575 m)   Wt 98 lb (44.5 kg)   BMI 17.92 kg/m   Past Medical History:  Diagnosis Date   GERD (gastroesophageal reflux disease)    Hypertension    Peripheral arterial disease (HCC)     Social History   Socioeconomic History   Marital status: Widowed    Spouse name: Not on file   Number of children: Not on file   Years of education: Not on file   Highest education level: Not on file  Occupational History   Not on file  Tobacco Use   Smoking status: Every Day    Packs/day: 1.00    Pack years: 0.00    Types: Cigarettes   Smokeless tobacco: Never  Vaping Use   Vaping Use: Some days  Substance and Sexual Activity   Alcohol use: Yes    Comment: occasionally    Drug use: Never   Sexual activity: Not on file  Other Topics  Concern   Not on file  Social History Narrative   Not on file   Social Determinants of Health   Financial Resource Strain: Not on file  Food Insecurity: Not on file  Transportation Needs: Not on file  Physical Activity: Not on file  Stress: Not on file  Social Connections: Not on file  Intimate Partner Violence: Not on file    Past Surgical History:  Procedure Laterality Date   ABDOMINAL AORTOGRAM W/LOWER EXTREMITY Bilateral 08/16/2020   Procedure: ABDOMINAL AORTOGRAM W/LOWER EXTREMITY;  Surgeon: 08/18/2020, MD;  Location: [facility] INVASIVE CV LAB;  Service: Cardiovascular;  Laterality: Bilateral;   AMPUTATION Left 08/20/2020   Procedure: LEFT FIRST TOE AMPUTATION;  Surgeon: 08/22/2020, MD;  Location[facility] OR;  Service: Vascular;  Laterality: Left;   BYPASS GRAFT FEMORAL-PERONEAL Left 08/20/2020   Procedure: exploration left peroneal artery ;  Surgeon: 08/22/2020, MD;  Location: [facility] OR;  Service: Vascular;  Laterality: Left;   CHOLECYSTECTOMY     LOWER EXTREMITY ANGIOGRAPHY N/A 11/06/2020   Procedure: LOWER EXTREMITY ANGIOGRAPHY;  Surgeon: 01/06/2021, MD;  Location: [facility] INVASIVE CV LAB;  Service: Cardiovascular;  Laterality: N/A;   VEIN HARVEST Left 08/20/2020   Procedure: VEIN HARVEST LEFT GREATER SAPHENOUS VEIN;  Surgeon: 08/22/2020, MD;  Location: [facility] OR;  Service: Vascular;  Laterality: Left;    Family History  Problem Relation Age of Onset   CVA Mother    Hypertension Father     No Known Allergies  CBC Latest Ref Rng & Units 11/16/2020 11/15/2020 11/15/2020  WBC 4.0 - 10.5 K/uL 11.3(H) - 10.0  Hemoglobin 12.0 - 15.0 g/dL 11/17/2020) 5.5(H) 7.0(L)  Hematocrit 36.0 - 46.0 % 31.4(L) 30.3(L) 23.0(L)  Platelets 150 - 400 K/uL 335 - 257      CMP     Component Value Date/Time   NA 142 11/16/2020 0612   K 3.3 (L) 11/16/2020 0612   CL 103 11/16/2020 0612   CO2 30 11/16/2020 0612   GLUCOSE 105 (H) 11/16/2020 0612   BUN 17 11/16/2020 0612   CREATININE 0.62  11/16/2020 0612   CALCIUM 8.9 11/16/2020 0612   PROT 6.2 (L) 11/15/2020 0524   ALBUMIN 2.3 (L)  11/15/2020 0524   AST 15 11/15/2020 0524   ALT 13 11/15/2020 0524   ALKPHOS 79 11/15/2020 0524   BILITOT 0.4 11/15/2020 0524   GFRNONAA >60 11/16/2020 0612   GFRAA >60 12/20/2017 0915     No results found.     Assessment & Plan:   1. Atherosclerosis of native artery of left lower extremity with gangrene (HCC) I had a long discussion with thepatient and family in regards to the neck steps.  Based on the noninvasive studies today, the patient should have a chance at wound healing.  The patient has multiple lower extremity wounds and 2 different possibilities were discussed.  We could do a first ray amputation with debridement and wound VAC placement on her leg wound.  Even with increased perfusion and wound VAC placement, there is the possibility that her wounds may not heal or that this may be an extended process.  We also discussed that even with a first ray amputation, if during surgery there is muscle and tissue death noted, this could transition to more of the transmetatarsal or more extensive amputation.  As an alternative, we could proceed with a below-knee amputation.  However, this option also has possibilities of delayed wound healing and possible revision above-knee amputation.  After answering questions from patient and family, the decision was made to go with the fracture amputation as well as debridement of the leg wound and wound VAC placement.  The risk, benefits and alternatives were discussed.  The patient will follow-up in the office following surgery.  2. Tobacco abuse Smoking cessation was discussed, 3-10 minutes spent on this topic specifically   3. Essential hypertension Continue antihypertensive medications as already ordered, these medications have been reviewed and there are no changes at this time.    Current Outpatient Medications on File Prior to Visit  Medication Sig  Dispense Refill   acetaminophen (TYLENOL) 325 MG tablet Take 650 mg by mouth every 6 (six) hours as needed for moderate pain or headache.     albuterol (VENTOLIN HFA) 108 (90 Base) MCG/ACT inhaler Inhale 1 puff into the lungs 4 (four) times daily as needed for wheezing or shortness of breath.     apixaban (ELIQUIS) 5 MG TABS tablet TAKE 1 TABLET (5 MG TOTAL) BY MOUTH TWO TIMES DAILY. 60 tablet 2   aspirin EC 81 MG tablet Take 1 tablet (81 mg total) by mouth daily. 150 tablet 2   atorvastatin (LIPITOR) 10 MG tablet Take 1 tablet (10 mg total) by mouth daily. 30 tablet 11   carvedilol (COREG) 25 MG tablet Take 1 tablet (25 mg total) by mouth 2 (two) times daily with a meal. 60 tablet 2   diltiazem (CARDIZEM CD) 360 MG 24 hr capsule Take 1 capsule (360 mg total) by mouth daily. 30 capsule 0   furosemide (LASIX) 40 MG tablet Take 1 tablet (40 mg total) by mouth daily. 30 tablet 0   hydrALAZINE (APRESOLINE) 100 MG tablet Take 1 tablet (100 mg total) by mouth every 8 (eight) hours. 90 tablet 1   isosorbide mononitrate (IMDUR) 60 MG 24 hr tablet Take 1 tablet (60 mg total) by mouth daily. 30 tablet 1   lisinopril (ZESTRIL) 40 MG tablet Take 1 tablet (40 mg total) by mouth daily. 30 tablet 0   potassium chloride SA (KLOR-CON) 20 MEQ tablet Take 1 tablet (20 mEq total) by mouth 2 (two) times daily for 10 days. 20 tablet 0   No current facility-administered medications on file prior  to visit.    There are no Patient Instructions on file for this visit. No follow-ups on file.   Dieter Hane E Maija Biggers, NP    

## 2020-11-22 ENCOUNTER — Telehealth (INDEPENDENT_AMBULATORY_CARE_PROVIDER_SITE_OTHER): Payer: Self-pay

## 2020-11-22 NOTE — Telephone Encounter (Signed)
Patient called requesting refill for baclofen 5 mg that was prescribed last month by another provider. The patient stated that she feel much better since starting the Hydrocodone and Gabapentin that was prescribed by our office. The patient will contact our office if she plans to move forward with requesting refill for Baclofen.

## 2020-11-29 DEATH — deceased

## 2020-12-25 ENCOUNTER — Telehealth (INDEPENDENT_AMBULATORY_CARE_PROVIDER_SITE_OTHER): Payer: Self-pay

## 2020-12-25 NOTE — Telephone Encounter (Signed)
Reach out to the patient daughter (latoya) pertaining to medical record request to inform her that we needed POA paper work.
